# Patient Record
Sex: Male | Born: 2000 | Race: Black or African American | Hispanic: No | Marital: Single | Smoking: Never smoker
Health system: Southern US, Community
[De-identification: ages and names within clinical notes are randomized; demographics above are authoritative.]

## PROBLEM LIST (undated history)

## (undated) DIAGNOSIS — I10 Essential (primary) hypertension: Secondary | ICD-10-CM

## (undated) DIAGNOSIS — E119 Type 2 diabetes mellitus without complications: Secondary | ICD-10-CM

## (undated) HISTORY — DX: Type 2 diabetes mellitus without complications: E11.9

## (undated) HISTORY — PX: OTHER SURGICAL HISTORY: SHX169

---
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
## 2021-01-09 ENCOUNTER — Other Ambulatory Visit (INDEPENDENT_AMBULATORY_CARE_PROVIDER_SITE_OTHER): Payer: Self-pay | Admitting: Family

## 2021-01-09 DIAGNOSIS — IMO0002 Reserved for concepts with insufficient information to code with codable children: Secondary | ICD-10-CM

## 2021-01-09 DIAGNOSIS — E1065 Type 1 diabetes mellitus with hyperglycemia: Secondary | ICD-10-CM

## 2021-01-10 ENCOUNTER — Encounter (INDEPENDENT_AMBULATORY_CARE_PROVIDER_SITE_OTHER): Payer: Self-pay | Admitting: Family

## 2021-01-10 ENCOUNTER — Ambulatory Visit (INDEPENDENT_AMBULATORY_CARE_PROVIDER_SITE_OTHER): Payer: Medicaid Other | Admitting: Family

## 2021-01-10 ENCOUNTER — Other Ambulatory Visit: Payer: Self-pay

## 2021-01-10 VITALS — BP 122/76 | HR 76 | Wt 176.2 lb

## 2021-01-10 DIAGNOSIS — Z4681 Encounter for fitting and adjustment of insulin pump: Secondary | ICD-10-CM | POA: Diagnosis not present

## 2021-01-10 DIAGNOSIS — Z9119 Patient's noncompliance with other medical treatment and regimen: Secondary | ICD-10-CM | POA: Diagnosis not present

## 2021-01-10 DIAGNOSIS — IMO0002 Reserved for concepts with insufficient information to code with codable children: Secondary | ICD-10-CM

## 2021-01-10 DIAGNOSIS — Z91199 Patient's noncompliance with other medical treatment and regimen due to unspecified reason: Secondary | ICD-10-CM

## 2021-01-10 DIAGNOSIS — E1065 Type 1 diabetes mellitus with hyperglycemia: Secondary | ICD-10-CM

## 2021-01-10 LAB — POCT GLUCOSE (DEVICE FOR HOME USE): POC Glucose: 272 mg/dl — AB (ref 70–99)

## 2021-01-10 LAB — POCT GLYCOSYLATED HEMOGLOBIN (HGB A1C): Hemoglobin A1C: 12.8 % — AB (ref 4.0–5.6)

## 2021-01-10 NOTE — Progress Notes (Signed)
Pediatric Endocrinology Diabetes Consultation Follow-up Visit  Ricky Shaw August 24, 2000 [id number]  Chief Complaint: Follow-up type 1 diabetes   Ricky Benders, MD   HPI: Ricky Shaw  is a 20 y.o. male presenting for follow-up of type 1 diabetes. he is accompanied to this visit by his grandmother (stayed in lobby) .  1. Ricky Shaw was admitted to the pediatric ward at [facility] on 11/13/11 for the above chief complaint. He had had polyuria and polydipsia. His initial BG was 340. Initial serum glucose was 354. Serum CO2 was 18. Venous pH was 7.367.  Hemoglobin A1c was 13.3%. Serum C-peptide was 0.68 (normal 0.80-390). Urine glucose was >1000 and urine ketones were >80. Anti-islet cell antibody was 40 (normal <5). Insulin antibodies were 5.0 (normal <0.4).Anti-GAD antibody was negative at <1.0. TSH was 2.344, free T4 1.36, free T3 3.5.  Tissue transglutaminase IgA was 2.2 (normal <20). Gliadin IgA antibodies were 4.9 (normal <20). He was started on Lantus as a basal insulin and Novolog as a bolus insulin at mealtimes, and also at bedtime and 2 AM if needed. After receiving IV fluids, insulins, and DM education, he was discharged on 11/17/11.   2. Since last visit to [facility] on 11/2020, he has been well. He has no hospitalizations or ER visits.  He recently go laid off from his job and is now looking for another job.  Using Tandem Tslim insulin pump and Dexcom CGM. Reports pump is working well but is having problems getting Dexcom CGM refills. He went one week without using CGM. He is bolusing before eating at most meals. Forgets to bolus about once per day. He estimates his carbs. Feels like his blood sugars are "usually high".   Insulin regimen: Tsim insulin pump  Basal Rates 12AM  1.65  3am 1.75   6am 1.80   8am 2.0   9pm   2. 0       Insulin to Carbohydrate Ratio 12AM 10  6am 4  11am 4  4pm 4       Insulin Sensitivity Factor 12AM 24  3am 24  6am 24  7am 20  8am 9pm 11pm  _0 Target Blood Glucose 12AM 150  6am 110  9pm 150          Hypoglycemia: Is not consistently able to feel low blood sugars.practice. No glucagon needed.  Insulin pump download:  - Review of his pump shows he is usually bolusing 1 time per day or less. He has multiple days with 0 boluses.  Med-alert ID: Bracelet  Injection sites: legs, arms  Annual labs due: 12/2020 Ophthalmology due: 2021. Discussed he is overdue. Needs to have exam.     3. ROS: Greater than 10 systems reviewed with pertinent positives listed in HPI, otherwise neg. Constitutional: Sleeping well. Weight stable.  Eyes: No changes in vision. No blurry vision.  Ears/Nose/Mouth/Throat: No difficulty swallowing. Denies neck pain  Cardiovascular: No palpitations. Denies chest pain  Respiratory: No increased work of breathing. Denies SOB   Neurologic: Normal sensation, no tremor GI: No abdominal pain. No constipation/diarrhea.  Endocrine: No polydipsia.  No hyperpigmentation Psychiatric: Normal affect today. Denies depression and anxiety   Past Medical History:   Past Medical History:  Diagnosis Date   Diabetes mellitus 11/14/2011   Diabetes mellitus without complication ([place])    Phreesia 03/03/2020    Medications:  Outpatient Encounter Medications as of 01/10/2021  Medication Sig Note   Accu-Chek Softclix Lancets lancets Use as instructed  Blood Glucose Monitoring Suppl (ACCU-CHEK GUIDE ME) w/Device KIT 1 kit by Does not apply route as directed.    Continuous Blood Gluc Receiver (DEXCOM G6 RECEIVER) DEVI 1 Device by Does not apply route as directed.    Continuous Blood Gluc Sensor (DEXCOM G6 SENSOR) MISC INJECT 1 APPLICATOR INTO THE SKIN AS DIRECTED(CHANGE SENSOR EVERY 10 DAYS)    Continuous Blood Gluc Transmit (DEXCOM G6 TRANSMITTER) MISC Inject 1 Device into the skin as directed. (re-use up to 8x with each new sensor)    glucagon 1 MG injection Use for Severe Hypoglycemia, if unresponsive, unable  to swallow, unconscious and/or has seizure 04/20/2020: .   glucose blood (ACCU-CHEK GUIDE) test strip Use as instructed    insulin aspart (NOVOLOG FLEXPEN) 100 UNIT/ML FlexPen Back up for pump malfunction    Insulin Glargine (LANTUS SOLOSTAR) 100 UNIT/ML Solostar Pen Use up to 50 units daily as directed by MD 08/04/2019: For back up   Insulin Human (INSULIN PUMP) SOLN Inject 1 each into the skin See admin instructions. Use with Humalog.    insulin lispro (HUMALOG) 100 UNIT/ML injection Use 300 units in insulin pump every 48 hours    No facility-administered encounter medications on file as of 01/10/2021.    Allergies: Allergies  Allergen Reactions   Amoxicillin     Does not recall what reaction was Mother called MD to find allergy. Confirms amoxicillin    Surgical History: Past Surgical History:  Procedure Laterality Date   nasal cauterization     NASAL HEMORRHAGE CONTROL  05/14/2006    Family History:  Family History  Problem Relation Age of Onset   Asthma Maternal Aunt    Cancer Maternal Grandfather    Diabetes Paternal Grandmother    Hypertension Other    Thyroid disease Neg Hx       Social History: Lives with: mother  Graduated. Unemployed currently   Physical Exam:  Vitals:   01/10/21 1001  BP: 122/76  Pulse: 76  Weight: 176 lb 3.2 oz (79.9 kg)     BP 122/76 (BP Location: Right Arm, Patient Position: Sitting, Cuff Size: Normal)   Pulse 76   Wt 176 lb 3.2 oz (79.9 kg)   BMI 29.00 kg/m  Body mass index: body mass index is 29 kg/m. Blood pressure percentiles are not available for patients who are 18 years or older.  Ht Readings from Last 3 Encounters:  08/02/20 5' 5.35" (1.66 m) (8 %, Z= -1.43)*  04/20/20 _0  (1.651 m) (6 %, Z= -1.52)*  04/18/20 5' 5.35" (1.66 m) (8 %, Z= -1.40)*   * Growth percentiles are based on CDC (Boys, 2-20 Years) data.   Wt Readings from Last 3 Encounters:  01/10/21 176 lb 3.2 oz (79.9 kg) (81 %, Z= 0.88)*  11/29/20 180 lb  12.8 oz (82 kg) (85 %, Z= 1.03)*  09/01/20 188 lb 6.4 oz (85.5 kg) (90 %, Z= 1.27)*   * Growth percentiles are based on CDC (Boys, 2-20 Years) data.    PHYSICAL EXAM: General: Well developed, well nourished male in no acute distress.   Head: Normocephalic, atraumatic.   Eyes:  Pupils equal and round. EOMI.  Sclera white.  No eye drainage.   Ears/Nose/Mouth/Throat: Nares patent, no nasal drainage.  Normal dentition, mucous membranes moist.  Neck: supple, no cervical lymphadenopathy, no thyromegaly Cardiovascular: regular rate, normal S1/S2, no murmurs Respiratory: No increased work of breathing.  Lungs clear to auscultation bilaterally.  No wheezes. Abdomen: soft, nontender, nondistended. Normal bowel sounds.  No appreciable masses  Extremities: warm, well perfused, cap refill < 2 sec.   Musculoskeletal: Normal muscle mass.  Normal strength Skin: warm, dry.  No rash or lesions. Neurologic: alert and oriented, normal speech, no tremor  Labs:  Results for orders placed or performed in visit on 01/10/21  POCT glycosylated hemoglobin (Hb A1C)  Result Value Ref Range   Hemoglobin A1C 12.8 (A) 4.0 - 5.6 %   HbA1c POC (<> result, manual entry)     HbA1c, POC (prediabetic range)     HbA1c, POC (controlled diabetic range)    POCT Glucose (Device for Home Use)  Result Value Ref Range   Glucose Fasting, POC     POC Glucose 272 (A) 70 - 99 mg/dl    Assessment/Plan: Drew is a 20 y.o. male with uncontrolled type 1 diabetes using Tandem Tslim insulin pump. Struggling with compliance with diabetes care. Has frequent hyperglycemia due to not bolusing. Hemoglobin A1c is 12.8% today which is much higher then ADA goal of <7%.    1-4. DM w/o complication type I, uncontrolled (HCC)/Hyperglycemia/hypoglycemia unawareness/elevated a1c  - Reviewed insulin pump and CGM download. Discussed trends and patterns.  - Rotate pump sites to prevent scar tissue.  - bolus 15 minutes prior to eating to  limit blood sugar spikes.  - Reviewed carb counting and importance of accurate carb counting.  - Discussed signs and symptoms of hypoglycemia. Always have glucose available.  - POCT glucose and hemoglobin A1c  - Reviewed growth chart.  - Stressed importance of bolusing accurately at all meals and snacks. Enter carbs and blood sugars.   4. Adjustment reaction/noncompliance  - Discussed barriers to care and motivation.  - Discussed complications of uncontrolle  T1DM.   5. Insulin pump Titration /Insulin pump in place.  - Basal Rates 12AM  1.65--> 1.75  3am 1.75 --> 1.85  6am 1.80 --> 1.90   8am 2.0 --> 2.1   9pm   2. 0 --> 2.1    48.8 units per day    Follow-up:  4 weeks   LOS:>45 spent today reviewing the medical chart, counseling the patient/family, and documenting today's visit.  When a patient is on insulin, intensive monitoring of blood glucose levels is necessary to avoid hyperglycemia and hypoglycemia. Severe hyperglycemia/hypoglycemia can lead to hospital admissions and be life threatening.     Hermenia Bers,  FNP-C  Pediatric Specialist  [address] [place]  [place], [address]  Tele: [phone]

## 2021-01-10 NOTE — Patient Instructions (Signed)
It was a pleasure seeing you in clinic today. Please do not hesitate to contact me if you have questions or concerns.    Please sign up for MyChart. This is a communication tool that allows you to send an email directly to me. This can be used for questions, prescriptions and blood sugar reports. We will also release labs to you with instructions on MyChart. Please do not use MyChart if you need immediate or emergency assistance. Ask our wonderful front office staff if you need assistance.   - Basal Rates 12AM  1.65--> 1.75  3am 1.75 --> 1.85  6am 1.80 --> 1.90   8am 2.0 --> 2.1   9pm   2. 0 --> 2.1

## 2021-02-02 ENCOUNTER — Telehealth (INDEPENDENT_AMBULATORY_CARE_PROVIDER_SITE_OTHER): Payer: Self-pay | Admitting: Family

## 2021-02-02 DIAGNOSIS — E1065 Type 1 diabetes mellitus with hyperglycemia: Secondary | ICD-10-CM

## 2021-02-02 DIAGNOSIS — IMO0002 Reserved for concepts with insufficient information to code with codable children: Secondary | ICD-10-CM

## 2021-02-02 MED ORDER — INSULIN LISPRO 100 UNIT/ML ~~LOC~~ SOLN: SUBCUTANEOUS | 5 refills | Status: DC

## 2021-02-02 NOTE — Telephone Encounter (Signed)
  Who's calling (name and relationship to patient) :mom / [name]   Best contact number:[phone]  Provider they SPQ:ZRAQTMA Dalbert [name]   Reason for call:medication      PRESCRIPTION REFILL ONLY  Name of prescription:Humalog  Pharmacy:[facility]

## 2021-02-02 NOTE — Telephone Encounter (Signed)
Refill sent to pharmacy.   

## 2021-02-07 ENCOUNTER — Other Ambulatory Visit: Payer: Self-pay

## 2021-02-07 ENCOUNTER — Encounter (INDEPENDENT_AMBULATORY_CARE_PROVIDER_SITE_OTHER): Payer: Self-pay | Admitting: Family

## 2021-02-07 ENCOUNTER — Ambulatory Visit (INDEPENDENT_AMBULATORY_CARE_PROVIDER_SITE_OTHER): Payer: Medicaid Other | Admitting: Family

## 2021-02-07 VITALS — BP 130/80 | HR 86 | Ht 65.35 in | Wt 182.4 lb

## 2021-02-07 DIAGNOSIS — E1065 Type 1 diabetes mellitus with hyperglycemia: Secondary | ICD-10-CM

## 2021-02-07 DIAGNOSIS — Z9119 Patient's noncompliance with other medical treatment and regimen: Secondary | ICD-10-CM

## 2021-02-07 DIAGNOSIS — Z4681 Encounter for fitting and adjustment of insulin pump: Secondary | ICD-10-CM

## 2021-02-07 DIAGNOSIS — Z23 Encounter for immunization: Secondary | ICD-10-CM | POA: Diagnosis not present

## 2021-02-07 DIAGNOSIS — Z91199 Patient's noncompliance with other medical treatment and regimen due to unspecified reason: Secondary | ICD-10-CM

## 2021-02-07 DIAGNOSIS — IMO0002 Reserved for concepts with insufficient information to code with codable children: Secondary | ICD-10-CM

## 2021-02-07 LAB — POCT URINALYSIS DIPSTICK

## 2021-02-07 LAB — POCT GLUCOSE (DEVICE FOR HOME USE): POC Glucose: 354 mg/dl — AB (ref 70–99)

## 2021-02-07 NOTE — Patient Instructions (Addendum)
It was a pleasure seeing you in clinic today. Please do not hesitate to contact me if you have questions or concerns.   - Basal Rates 12AM 1.75--> 1.85  3am 1.85--> 1.95  6am 1.90--> 2.0   8am 2.1--> 2.20   9pm  2.1 --> 2.20    50.6 units per day   Insulin to Carbohydrate Ratio 12AM 10  6am 4  11am 4--> 3   4pm 4       Please sign up for MyChart. This is a communication tool that allows you to send an email directly to me. This can be used for questions, prescriptions and blood sugar reports. We will also release labs to you with instructions on MyChart. Please do not use MyChart if you need immediate or emergency assistance. Ask our wonderful front office staff if you need assistance.

## 2021-02-07 NOTE — Progress Notes (Signed)
Pediatric Endocrinology Diabetes Consultation Follow-up Visit  Ricky Shaw 13-Aug-2000 [id number]  Chief Complaint: Follow-up type 1 diabetes   [name], MD   HPI: Ricky Shaw  is a 20 y.o. male presenting for follow-up of type 1 diabetes. he is accompanied to this visit by his grandmother (stayed in lobby) .  1. Ricky Shaw was admitted to the pediatric ward at [facility] on 11/13/11 for the above chief complaint. He had had polyuria and polydipsia. His initial BG was 340. Initial serum glucose was 354. Serum CO2 was 18. Venous pH was 7.367.  Hemoglobin A1c was 13.3%. Serum C-peptide was 0.68 (normal 0.80-390). Urine glucose was >1000 and urine ketones were >80. Anti-islet cell antibody was 40 (normal <5). Insulin antibodies were 5.0 (normal <0.4).Anti-GAD antibody was negative at <1.0. TSH was 2.344, free T4 1.36, free T3 3.5.  Tissue transglutaminase IgA was 2.2 (normal <20). Gliadin IgA antibodies were 4.9 (normal <20). He was started on Lantus as a basal insulin and Novolog as a bolus insulin at mealtimes, and also at bedtime and 2 AM if needed. After receiving IV fluids, insulins, and DM education, he was discharged on 11/17/11.   2. Since last visit to [facility] on 12/2020, he has been well. He has no hospitalizations or ER visits.  He has been spending most of his free time helping his Grandmother and filling out job applications. He has not gotten much activity lately.   Reports that things are getting better with his diabetes care. He feels like he is bolusing more consistently and when he does forgets he gives correction bolus later. His Tslim is working well and he is consistently wearing Dexcom. Rarely having hypoglycemia.     Insulin regimen: Tsim insulin pump  - Basal Rates 12AM 1.75  3am 1.85  6am 1.90  8am 2.1   9pm  2.1    48.8 units per day   Insulin to Carbohydrate Ratio 12AM 10  6am 4  11am 4  4pm 4       Insulin Sensitivity Factor 12AM 24  3am 24  6am 24   7am 20  8am 9pm 11pm '20 20 24   ' Target Blood Glucose 12AM 150  6am 110  9pm 150          Hypoglycemia: Is not consistently able to feel low blood sugars.practice. No glucagon needed.  Insulin pump download:   Med-alert ID: Bracelet  Injection sites: legs, arms  Annual labs due: 12/2020 Ophthalmology due: 2021. Discussed he is overdue. Needs to have exam.     3. ROS: Greater than 10 systems reviewed with pertinent positives listed in HPI, otherwise neg. Constitutional: Sleeping well. Weight stable.  Eyes: No changes in vision. No blurry vision.  Ears/Nose/Mouth/Throat: No difficulty swallowing. Denies neck pain  Cardiovascular: No palpitations. Denies chest pain  Respiratory: No increased work of breathing. Denies SOB   Neurologic: Normal sensation, no tremor GI: No abdominal pain. No constipation/diarrhea.  Endocrine: No polydipsia.  No hyperpigmentation Psychiatric: Normal affect today. Denies depression and anxiety   Past Medical History:   Past Medical History:  Diagnosis Date   Diabetes mellitus 11/14/2011   Diabetes mellitus without complication ([place])    Phreesia 03/03/2020    Medications:  Outpatient Encounter Medications as of 02/07/2021  Medication Sig Note   Accu-Chek Softclix Lancets lancets Use as instructed    Blood Glucose Monitoring Suppl (ACCU-CHEK GUIDE ME) w/Device KIT 1 kit by Does not apply route as directed.    Continuous Blood Gluc Receiver (  DEXCOM G6 RECEIVER) DEVI 1 Device by Does not apply route as directed.    Continuous Blood Gluc Sensor (DEXCOM G6 SENSOR) MISC INJECT 1 APPLICATOR INTO THE SKIN AS DIRECTED(CHANGE SENSOR EVERY 10 DAYS)    Continuous Blood Gluc Transmit (DEXCOM G6 TRANSMITTER) MISC Inject 1 Device into the skin as directed. (re-use up to 8x with each new sensor)    glucagon 1 MG injection Use for Severe Hypoglycemia, if unresponsive, unable to swallow, unconscious and/or has seizure 04/20/2020: .   glucose blood (ACCU-CHEK  GUIDE) test strip Use as instructed    insulin aspart (NOVOLOG FLEXPEN) 100 UNIT/ML FlexPen Back up for pump malfunction    Insulin Glargine (LANTUS SOLOSTAR) 100 UNIT/ML Solostar Pen Use up to 50 units daily as directed by MD 08/04/2019: For back up   Insulin Human (INSULIN PUMP) SOLN Inject 1 each into the skin See admin instructions. Use with Humalog.    insulin lispro (HUMALOG) 100 UNIT/ML injection Use 300 units in insulin pump every 48 hours    No facility-administered encounter medications on file as of 02/07/2021.    Allergies: Allergies  Allergen Reactions   Amoxicillin     Does not recall what reaction was Mother called MD to find allergy. Confirms amoxicillin    Surgical History: Past Surgical History:  Procedure Laterality Date   nasal cauterization     NASAL HEMORRHAGE CONTROL  05/14/2006    Family History:  Family History  Problem Relation Age of Onset   Asthma Maternal Aunt    Cancer Maternal Grandfather    Diabetes Paternal Grandmother    Hypertension Other    Thyroid disease Neg Hx       Social History: Lives with: mother  Graduated. Unemployed currently   Physical Exam:  There were no vitals filed for this visit.    There were no vitals taken for this visit. Body mass index: body mass index is unknown because there is no height or weight on file. Blood pressure percentiles are not available for patients who are 18 years or older.  Ht Readings from Last 3 Encounters:  08/02/20 5' 5.35" (1.66 m) (8 %, Z= -1.43)*  04/20/20 '5\' 5"'  (1.651 m) (6 %, Z= -1.52)*  04/18/20 5' 5.35" (1.66 m) (8 %, Z= -1.40)*   * Growth percentiles are based on CDC (Boys, 2-20 Years) data.   Wt Readings from Last 3 Encounters:  01/10/21 176 lb 3.2 oz (79.9 kg) (81 %, Z= 0.88)*  11/29/20 180 lb 12.8 oz (82 kg) (85 %, Z= 1.03)*  09/01/20 188 lb 6.4 oz (85.5 kg) (90 %, Z= 1.27)*   * Growth percentiles are based on CDC (Boys, 2-20 Years) data.    PHYSICAL EXAM: General:  Well developed, well nourished male in no acute distress.   Head: Normocephalic, atraumatic.   Eyes:  Pupils equal and round. EOMI.  Sclera white.  No eye drainage.   Ears/Nose/Mouth/Throat: Nares patent, no nasal drainage.  Normal dentition, mucous membranes moist.  Neck: supple, no cervical lymphadenopathy, no thyromegaly Cardiovascular: regular rate, normal S1/S2, no murmurs Respiratory: No increased work of breathing.  Lungs clear to auscultation bilaterally.  No wheezes. Abdomen: soft, nontender, nondistended. Normal bowel sounds.  No appreciable masses  Extremities: warm, well perfused, cap refill < 2 sec.   Musculoskeletal: Normal muscle mass.  Normal strength Skin: warm, dry.  No rash or lesions. Neurologic: alert and oriented, normal speech, no tremor   Labs:  Results for orders placed or performed in visit on  01/10/21  POCT glycosylated hemoglobin (Hb A1C)  Result Value Ref Range   Hemoglobin A1C 12.8 (A) 4.0 - 5.6 %   HbA1c POC (<> result, manual entry)     HbA1c, POC (prediabetic range)     HbA1c, POC (controlled diabetic range)    POCT Glucose (Device for Home Use)  Result Value Ref Range   Glucose Fasting, POC     POC Glucose 272 (A) 70 - 99 mg/dl    Assessment/Plan: Tiago is a 20 y.o. male with uncontrolled type 1 diabetes using Tandem Tslim insulin pump. He has done better with diabetes control over the past month. He is bolusing more consistently which has decreased time in hyperglycemia. TIR has improved to 43% but is less then goal of >70%. He needs more basal insulin throughout the day.     1-4. DM w/o complication type I, uncontrolled (HCC)/Hyperglycemia/hypoglycemia unawareness/elevated a1c  - Reviewed insulin pump and CGM download. Discussed trends and patterns.  - Rotate pump sites to prevent scar tissue.  - bolus 15 minutes prior to eating to limit blood sugar spikes.  - Reviewed carb counting and importance of accurate carb counting.  - Discussed  signs and symptoms of hypoglycemia. Always have glucose available.  - POCT glucose and hemoglobin A1c  - Reviewed growth chart.   4. Adjustment reaction/noncompliance  - Discussed barriers to care and motivation.  - Praise given for improvements.   5. Insulin pump Titration /Insulin pump in place.  - Basal Rates 12AM 1.75--> 1.85  3am 1.85--> 1.95  6am 1.90--> 2.0   8am 2.1--> 2.20   9pm  2.1 --> 2.20    50.6 units per day   Insulin to Carbohydrate Ratio 12AM 10  6am 4  11am 4--> 3   4pm 4      Follow-up:  6 weeks.   LOS:>30  spent today reviewing the medical chart, counseling the patient/family, and documenting today's visit.   When a patient is on insulin, intensive monitoring of blood glucose levels is necessary to avoid hyperglycemia and hypoglycemia. Severe hyperglycemia/hypoglycemia can lead to hospital admissions and be life threatening.     Hermenia Bers,  FNP-C  Pediatric Specialist  [address] [place]  [place], [address]  Tele: [phone]

## 2021-03-07 ENCOUNTER — Ambulatory Visit: Payer: Self-pay | Admitting: Pediatrics

## 2021-03-09 ENCOUNTER — Ambulatory Visit (INDEPENDENT_AMBULATORY_CARE_PROVIDER_SITE_OTHER): Payer: Medicaid Other | Admitting: Family

## 2021-03-09 NOTE — Progress Notes (Deleted)
Pediatric Endocrinology Diabetes Consultation Follow-up Visit  Ricky Shaw February 10, 2001 [id number]  Chief Complaint: Follow-up type 1 diabetes   [name], MD   HPI: Ricky Shaw  is a 20 y.o. male presenting for follow-up of type 1 diabetes. he is accompanied to this visit by his grandmother (stayed in lobby) .  1. Ricky Shaw was admitted to the pediatric ward at [facility] on 11/13/11 for the above chief complaint. He had had polyuria and polydipsia. His initial BG was 340. Initial serum glucose was 354. Serum CO2 was 18. Venous pH was 7.367.  Hemoglobin A1c was 13.3%. Serum C-peptide was 0.68 (normal 0.80-390). Urine glucose was >1000 and urine ketones were >80. Anti-islet cell antibody was 40 (normal <5). Insulin antibodies were 5.0 (normal <0.4).Anti-GAD antibody was negative at <1.0. TSH was 2.344, free T4 1.36, free T3 3.5.  Tissue transglutaminase IgA was 2.2 (normal <20). Gliadin IgA antibodies were 4.9 (normal <20). He was started on Lantus as a basal insulin and Novolog as a bolus insulin at mealtimes, and also at bedtime and 2 AM if needed. After receiving IV fluids, insulins, and DM education, he was discharged on 11/17/11.   2. Since last visit to [facility] on 01/2021, he has been well. He has no hospitalizations or ER visits.  He has been spending most of his free time helping his Grandmother and filling out job applications. He has not gotten much activity lately.   Reports that things are getting better with his diabetes care. He feels like he is bolusing more consistently and when he does forgets he gives correction bolus later. His Tslim is working well and he is consistently wearing Dexcom. Rarely having hypoglycemia.     Insulin regimen: Tsim insulin pump  - Basal Rates 12AM 1.85  3am 1.95  6am 2.0   8am 2.20   9pm  2.20    50.6 units per day   Insulin to Carbohydrate Ratio 12AM 10  6am 4  11am 3   4pm 4        Insulin Sensitivity Factor 12AM 24  3am 24   6am 24  7am 20  8am 9pm 11pm '20 20 24   ' Target Blood Glucose 12AM 150  6am 110  9pm 150          Hypoglycemia: Is not consistently able to feel low blood sugars.practice. No glucagon needed.  Insulin pump download:   Med-alert ID: Bracelet  Injection sites: legs, arms  Annual labs due: 12/2020 Ophthalmology due: 2021. Discussed he is overdue. Needs to have exam.     3. ROS: Greater than 10 systems reviewed with pertinent positives listed in HPI, otherwise neg. Constitutional: Sleeping well. Weight stable.  Eyes: No changes in vision. No blurry vision.  Ears/Nose/Mouth/Throat: No difficulty swallowing. Denies neck pain  Cardiovascular: No palpitations. Denies chest pain  Respiratory: No increased work of breathing. Denies SOB   Neurologic: Normal sensation, no tremor GI: No abdominal pain. No constipation/diarrhea.  Endocrine: No polydipsia.  No hyperpigmentation Psychiatric: Normal affect today. Denies depression and anxiety   Past Medical History:   Past Medical History:  Diagnosis Date   Diabetes mellitus 11/14/2011   Diabetes mellitus without complication ([place])    Phreesia 03/03/2020    Medications:  Outpatient Encounter Medications as of 03/09/2021  Medication Sig Note   Accu-Chek Softclix Lancets lancets Use as instructed    Blood Glucose Monitoring Suppl (ACCU-CHEK GUIDE ME) w/Device KIT 1 kit by Does not apply route as directed.    Continuous  Blood Gluc Receiver (DEXCOM G6 RECEIVER) DEVI 1 Device by Does not apply route as directed.    Continuous Blood Gluc Sensor (DEXCOM G6 SENSOR) MISC INJECT 1 APPLICATOR INTO THE SKIN AS DIRECTED(CHANGE SENSOR EVERY 10 DAYS)    Continuous Blood Gluc Transmit (DEXCOM G6 TRANSMITTER) MISC Inject 1 Device into the skin as directed. (re-use up to 8x with each new sensor)    glucagon 1 MG injection Use for Severe Hypoglycemia, if unresponsive, unable to swallow, unconscious and/or has seizure 04/20/2020: .   glucose blood  (ACCU-CHEK GUIDE) test strip Use as instructed    insulin aspart (NOVOLOG FLEXPEN) 100 UNIT/ML FlexPen Back up for pump malfunction    Insulin Glargine (LANTUS SOLOSTAR) 100 UNIT/ML Solostar Pen Use up to 50 units daily as directed by MD 08/04/2019: For back up   Insulin Human (INSULIN PUMP) SOLN Inject 1 each into the skin See admin instructions. Use with Humalog. (Patient not taking: Reported on 02/07/2021)    insulin lispro (HUMALOG) 100 UNIT/ML injection Use 300 units in insulin pump every 48 hours    No facility-administered encounter medications on file as of 03/09/2021.    Allergies: Allergies  Allergen Reactions   Amoxicillin     Does not recall what reaction was Mother called MD to find allergy. Confirms amoxicillin    Surgical History: Past Surgical History:  Procedure Laterality Date   nasal cauterization     NASAL HEMORRHAGE CONTROL  05/14/2006    Family History:  Family History  Problem Relation Age of Onset   Asthma Maternal Aunt    Cancer Maternal Grandfather    Diabetes Paternal Grandmother    Hypertension Other    Thyroid disease Neg Hx       Social History: Lives with: mother  Graduated. Unemployed currently   Physical Exam:  There were no vitals filed for this visit.    There were no vitals taken for this visit. Body mass index: body mass index is unknown because there is no height or weight on file. Blood pressure percentiles are not available for patients who are 18 years or older.  Ht Readings from Last 3 Encounters:  02/07/21 5' 5.35" (1.66 m) (7 %, Z= -1.46)*  08/02/20 5' 5.35" (1.66 m) (8 %, Z= -1.43)*  04/20/20 '5\' 5"'  (1.651 m) (6 %, Z= -1.52)*   * Growth percentiles are based on CDC (Boys, 2-20 Years) data.   Wt Readings from Last 3 Encounters:  02/07/21 182 lb 6.4 oz (82.7 kg) (85 %, Z= 1.05)*  01/10/21 176 lb 3.2 oz (79.9 kg) (81 %, Z= 0.88)*  11/29/20 180 lb 12.8 oz (82 kg) (85 %, Z= 1.03)*   * Growth percentiles are based on CDC  (Boys, 2-20 Years) data.    PHYSICAL EXAM: General: Well developed, well nourished male in no acute distress.   Head: Normocephalic, atraumatic.   Eyes:  Pupils equal and round. EOMI.  Sclera white.  No eye drainage.   Ears/Nose/Mouth/Throat: Nares patent, no nasal drainage.  Normal dentition, mucous membranes moist.  Neck: supple, no cervical lymphadenopathy, no thyromegaly Cardiovascular: regular rate, normal S1/S2, no murmurs Respiratory: No increased work of breathing.  Lungs clear to auscultation bilaterally.  No wheezes. Abdomen: soft, nontender, nondistended. Normal bowel sounds.  No appreciable masses  Extremities: warm, well perfused, cap refill < 2 sec.   Musculoskeletal: Normal muscle mass.  Normal strength Skin: warm, dry.  No rash or lesions. Neurologic: alert and oriented, normal speech, no tremor    Labs:  Results for orders placed or performed in visit on 02/07/21  POCT Glucose (Device for Home Use)  Result Value Ref Range   Glucose Fasting, POC     POC Glucose 354 (A) 70 - 99 mg/dl  POCT urinalysis dipstick  Result Value Ref Range   Color, UA     Clarity, UA     Glucose, UA     Bilirubin, UA     Ketones, UA trace    Spec Grav, UA     Blood, UA     pH, UA     Protein, UA     Urobilinogen, UA     Nitrite, UA     Leukocytes, UA     Appearance     Odor      Assessment/Plan: Ricky Shaw is a 20 y.o. male with uncontrolled type 1 diabetes using Tandem Tslim insulin pump. He has done better with diabetes control over the past month. He is bolusing more consistently which has decreased time in hyperglycemia. TIR has improved to 43% but is less then goal of >70%. He needs more basal insulin throughout the day.     1-4. DM w/o complication type I, uncontrolled (HCC)/Hyperglycemia/hypoglycemia unawareness/elevated a1c  - Reviewed insulin pump and CGM download. Discussed trends and patterns.  - Rotate pump sites to prevent scar tissue.  - bolus 15 minutes prior  to eating to limit blood sugar spikes.  - Reviewed carb counting and importance of accurate carb counting.  - Discussed signs and symptoms of hypoglycemia. Always have glucose available.  - POCT glucose and hemoglobin A1c  - Reviewed growth chart.    4. Adjustment reaction/noncompliance  - Discussed barriers to care and motivation.  - Praise given for improvements.   5. Insulin pump Titration /Insulin pump in place.  - Basal Rates 12AM 1.75--> 1.85  3am 1.85--> 1.95  6am 1.90--> 2.0   8am 2.1--> 2.20   9pm  2.1 --> 2.20    50.6 units per day   Insulin to Carbohydrate Ratio 12AM 10  6am 4  11am 4--> 3   4pm 4      Follow-up:  6 weeks.   LOS:>30  spent today reviewing the medical chart, counseling the patient/family, and documenting today's visit.   When a patient is on insulin, intensive monitoring of blood glucose levels is necessary to avoid hyperglycemia and hypoglycemia. Severe hyperglycemia/hypoglycemia can lead to hospital admissions and be life threatening.     Hermenia Bers,  FNP-C  Pediatric Specialist  [address] [place]  [place], [address]  Tele: [phone]

## 2021-03-10 ENCOUNTER — Other Ambulatory Visit (INDEPENDENT_AMBULATORY_CARE_PROVIDER_SITE_OTHER): Payer: Self-pay | Admitting: Family

## 2021-03-21 ENCOUNTER — Inpatient Hospital Stay (HOSPITAL_COMMUNITY): Payer: Medicaid Other

## 2021-03-21 ENCOUNTER — Inpatient Hospital Stay (HOSPITAL_COMMUNITY)
Admission: EM | Admit: 2021-03-21 | Discharge: 2021-03-23 | DRG: 919 | Disposition: A | Discharge status: Home / Self Care | Payer: Medicaid Other | Attending: Family Medicine | Admitting: Family Medicine

## 2021-03-21 DIAGNOSIS — T85614A Breakdown (mechanical) of insulin pump, initial encounter: Principal | ICD-10-CM | POA: Diagnosis present

## 2021-03-21 DIAGNOSIS — E1110 Type 2 diabetes mellitus with ketoacidosis without coma: Secondary | ICD-10-CM | POA: Diagnosis present

## 2021-03-21 DIAGNOSIS — R52 Pain, unspecified: Secondary | ICD-10-CM

## 2021-03-21 DIAGNOSIS — Z880 Allergy status to penicillin: Secondary | ICD-10-CM | POA: Diagnosis not present

## 2021-03-21 DIAGNOSIS — E1010 Type 1 diabetes mellitus with ketoacidosis without coma: Secondary | ICD-10-CM

## 2021-03-21 DIAGNOSIS — Y742 Prosthetic and other implants, materials and accessory general hospital and personal-use devices associated with adverse incidents: Secondary | ICD-10-CM | POA: Diagnosis present

## 2021-03-21 DIAGNOSIS — D72829 Elevated white blood cell count, unspecified: Secondary | ICD-10-CM | POA: Diagnosis present

## 2021-03-21 DIAGNOSIS — Z9641 Presence of insulin pump (external) (internal): Secondary | ICD-10-CM | POA: Diagnosis present

## 2021-03-21 DIAGNOSIS — Z794 Long term (current) use of insulin: Secondary | ICD-10-CM | POA: Diagnosis not present

## 2021-03-21 DIAGNOSIS — E875 Hyperkalemia: Secondary | ICD-10-CM | POA: Diagnosis present

## 2021-03-21 DIAGNOSIS — Z833 Family history of diabetes mellitus: Secondary | ICD-10-CM | POA: Diagnosis not present

## 2021-03-21 DIAGNOSIS — Z20822 Contact with and (suspected) exposure to covid-19: Secondary | ICD-10-CM | POA: Diagnosis present

## 2021-03-21 DIAGNOSIS — E860 Dehydration: Secondary | ICD-10-CM | POA: Diagnosis present

## 2021-03-21 DIAGNOSIS — R079 Chest pain, unspecified: Secondary | ICD-10-CM

## 2021-03-21 LAB — BASIC METABOLIC PANEL
Anion gap: 22 — ABNORMAL HIGH (ref 5–15)
BUN: 22 mg/dL — ABNORMAL HIGH (ref 6–20)
BUN: 19 mg/dL (ref 6–20)
CO2: <7 mmol/L — ABNORMAL LOW (ref 22–32)
CO2: 9 mmol/L — ABNORMAL LOW (ref 22–32)
Calcium: 10.1 mg/dL (ref 8.9–10.3)
Calcium: 9.5 mg/dL (ref 8.9–10.3)
Chloride: 97 mmol/L — ABNORMAL LOW (ref 98–111)
Chloride: 106 mmol/L (ref 98–111)
Creatinine, Ser: 1.59 mg/dL — ABNORMAL HIGH (ref 0.61–1.24)
Creatinine, Ser: 1.55 mg/dL — ABNORMAL HIGH (ref 0.61–1.24)
GFR, Estimated: >60 mL/min (ref >60)
GFR, Estimated: >60 mL/min (ref >60)
Glucose, Bld: 554 mg/dL (ref 70–99)
Glucose, Bld: 217 mg/dL — ABNORMAL HIGH (ref 70–99)
Potassium: 5.6 mmol/L — ABNORMAL HIGH (ref 3.5–5.1)
Potassium: 5.5 mmol/L — ABNORMAL HIGH (ref 3.5–5.1)
Sodium: 132 mmol/L — ABNORMAL LOW (ref 135–145)
Sodium: 137 mmol/L (ref 135–145)

## 2021-03-21 LAB — BETA-HYDROXYBUTYRIC ACID: Beta-Hydroxybutyric Acid: >8 mmol/L — ABNORMAL HIGH (ref 0.05–0.27)

## 2021-03-21 LAB — URINALYSIS, ROUTINE W REFLEX MICROSCOPIC
Bacteria, UA: NONE SEEN
Bilirubin Urine: NEGATIVE
Glucose, UA: >=500 mg/dL — AB
Ketones, ur: 80 mg/dL — AB
Leukocytes,Ua: NEGATIVE
Nitrite: NEGATIVE
Protein, ur: 30 mg/dL — AB
Specific Gravity, Urine: 1.021 (ref 1.005–1.030)
pH: 5 (ref 5.0–8.0)

## 2021-03-21 LAB — CBC
HCT: 49.7 % (ref 39.0–52.0)
Hemoglobin: 15.9 g/dL (ref 13.0–17.0)
MCH: 29.4 pg (ref 26.0–34.0)
MCHC: 32 g/dL (ref 30.0–36.0)
MCV: 91.9 fL (ref 80.0–100.0)
Platelets: 363 10*3/uL (ref 150–400)
RBC: 5.41 MIL/uL (ref 4.22–5.81)
RDW: 13.9 % (ref 11.5–15.5)
WBC: 14.8 10*3/uL — ABNORMAL HIGH (ref 4.0–10.5)
nRBC: 0 % (ref 0.0–0.2)

## 2021-03-21 LAB — CBG MONITORING, ED
Glucose-Capillary: 542 mg/dL (ref 70–99)
Glucose-Capillary: 541 mg/dL (ref 70–99)
Glucose-Capillary: 528 mg/dL (ref 70–99)
Glucose-Capillary: 420 mg/dL — ABNORMAL HIGH (ref 70–99)
Glucose-Capillary: 441 mg/dL — ABNORMAL HIGH (ref 70–99)
Glucose-Capillary: 319 mg/dL — ABNORMAL HIGH (ref 70–99)
Glucose-Capillary: 236 mg/dL — ABNORMAL HIGH (ref 70–99)
Glucose-Capillary: 210 mg/dL — ABNORMAL HIGH (ref 70–99)
Glucose-Capillary: 212 mg/dL — ABNORMAL HIGH (ref 70–99)

## 2021-03-21 LAB — I-STAT VENOUS BLOOD GAS, ED
Acid-base deficit: 17 mmol/L — ABNORMAL HIGH (ref 0.0–2.0)
Bicarbonate: 8.6 mmol/L — ABNORMAL LOW (ref 20.0–28.0)
Calcium, Ion: 1.14 mmol/L — ABNORMAL LOW (ref 1.15–1.40)
HCT: 52 % (ref 39.0–52.0)
Hemoglobin: 17.7 g/dL — ABNORMAL HIGH (ref 13.0–17.0)
O2 Saturation: 99 %
Potassium: 5.3 mmol/L — ABNORMAL HIGH (ref 3.5–5.1)
Sodium: 132 mmol/L — ABNORMAL LOW (ref 135–145)
TCO2: 9 mmol/L — ABNORMAL LOW (ref 22–32)
pCO2, Ven: 21.4 mmHg — ABNORMAL LOW (ref 44.0–60.0)
pH, Ven: 7.212 — ABNORMAL LOW (ref 7.250–7.430)
pO2, Ven: 141 mmHg — ABNORMAL HIGH (ref 32.0–45.0)

## 2021-03-21 LAB — LIPASE, BLOOD: Lipase: 18 U/L (ref 11–51)

## 2021-03-21 LAB — GLUCOSE, CAPILLARY
Glucose-Capillary: 186 mg/dL — ABNORMAL HIGH (ref 70–99)
Glucose-Capillary: 191 mg/dL — ABNORMAL HIGH (ref 70–99)
Glucose-Capillary: 193 mg/dL — ABNORMAL HIGH (ref 70–99)
Glucose-Capillary: 204 mg/dL — ABNORMAL HIGH (ref 70–99)

## 2021-03-21 LAB — RESP PANEL BY RT-PCR (FLU A&B, COVID) ARPGX2
Influenza A by PCR: NEGATIVE
Influenza B by PCR: NEGATIVE
SARS Coronavirus 2 by RT PCR: NEGATIVE

## 2021-03-21 MED ORDER — INSULIN REGULAR(HUMAN) IN NACL 100-0.9 UT/100ML-% IV SOLN
INTRAVENOUS | Status: DC
  Administered 2021-03-21: 10.5 [IU]/h via INTRAVENOUS
  Administered 2021-03-22: 2.2 [IU]/h via INTRAVENOUS
  Filled 2021-03-21 (×2): qty 100

## 2021-03-21 MED ORDER — LACTATED RINGERS IV BOLUS
1000.0000 mL | INTRAVENOUS | Status: AC
  Administered 2021-03-21: 1000 mL via INTRAVENOUS

## 2021-03-21 MED ORDER — ONDANSETRON HCL 4 MG/2ML IJ SOLN: 4.0000 mg | Freq: Four times a day (QID) | INTRAMUSCULAR | Status: DC | PRN

## 2021-03-21 MED ORDER — KETOROLAC TROMETHAMINE 30 MG/ML IJ SOLN
30.0000 mg | Freq: Once | INTRAMUSCULAR | Status: AC
  Administered 2021-03-21: 30 mg via INTRAVENOUS
  Filled 2021-03-21: qty 1

## 2021-03-21 MED ORDER — DEXTROSE IN LACTATED RINGERS 5 % IV SOLN: INTRAVENOUS | Status: DC

## 2021-03-21 MED ORDER — LACTATED RINGERS IV SOLN: INTRAVENOUS | Status: DC

## 2021-03-21 MED ORDER — DEXTROSE 50 % IV SOLN: 0.0000 mL | INTRAVENOUS | Status: DC | PRN

## 2021-03-21 MED ORDER — ENOXAPARIN SODIUM 40 MG/0.4ML IJ SOSY
40.0000 mg | PREFILLED_SYRINGE | INTRAMUSCULAR | Status: DC
  Administered 2021-03-21 – 2021-03-22 (×2): 40 mg via SUBCUTANEOUS
  Filled 2021-03-21 (×2): qty 0.4

## 2021-03-21 MED ORDER — ONDANSETRON HCL 4 MG/2ML IJ SOLN
4.0000 mg | Freq: Once | INTRAMUSCULAR | Status: AC
  Administered 2021-03-21: 4 mg via INTRAVENOUS
  Filled 2021-03-21: qty 2

## 2021-03-21 NOTE — ED Provider Notes (Signed)
[facility] EMERGENCY DEPARTMENT Provider Note   CSN: [id number] Arrival date & time: 03/21/21  1113     History Chief Complaint  Patient presents with   Emesis   Nausea    Ricky Shaw is a 19 y.o. male who presents to ED via EMS today for complaint of nausea and vomiting. Patient states he feels like "his sugar is high" and decided to call EMS because he believes he is in DKA. Patient denies any recent changes to diet. Patient states he wears an insulin pump and denies any recent issues with it or changes to insulin dosing. Patient was last seen by pediatric endocrinologist Spenser Beasley and and had no acute findings noted. Patient endorses increased thirst, urination and cold intolerance, nausea, vomiting and abdominal pain. Patient denies recent illness, fever, chills, diarrhea, chest pain, shortness of breath.    Emesis Associated symptoms: abdominal pain   Associated symptoms: no chills, no diarrhea, no fever and no sore throat       Past Medical History:  Diagnosis Date   Diabetes mellitus 11/14/2011   Diabetes mellitus without complication (HCC)    Phreesia 03/03/2020    Patient Active Problem List   Diagnosis Date Noted   DKA (diabetic ketoacidosis) (HCC) 04/20/2020   AKI (acute kidney injury) (HCC) 04/20/2020   Elevated hemoglobin A1c 01/21/2018   Insulin pump in place 07/08/2017   Hyperglycemia    Insulin pump titration 11/01/2015   Noncompliance with diabetes treatment    Hypoglycemia unawareness in type 1 diabetes mellitus (HCC) 04/20/2014   Polyuria 11/14/2011    Past Surgical History:  Procedure Laterality Date   nasal cauterization     NASAL HEMORRHAGE CONTROL  05/14/2006       Family History  Problem Relation Age of Onset   Asthma Maternal Aunt    Cancer Maternal Grandfather    Diabetes Paternal Grandmother    Hypertension Other    Thyroid disease Neg Hx     Social History   Tobacco Use   Smoking status: Never    Smokeless tobacco: Never  Vaping Use   Vaping Use: Never used  Substance Use Topics   Alcohol use: No   Drug use: Yes    Types: Marijuana    Home Medications Prior to Admission medications   Medication Sig Start Date End Date Taking? Authorizing Provider  Accu-Chek Softclix Lancets lancets Use as instructed 04/21/20   Jessup, Ashley Bashioum, MD  Blood Glucose Monitoring Suppl (ACCU-CHEK GUIDE ME) w/Device KIT 1 kit by Does not apply route as directed. 04/21/20   Jessup, Ashley Bashioum, MD  Continuous Blood Gluc Receiver (DEXCOM G6 RECEIVER) DEVI 1 Device by Does not apply route as directed. 12/21/19   Beasley, Spenser, NP  Continuous Blood Gluc Sensor (DEXCOM G6 SENSOR) MISC INJECT 1 APPLICATOR INTO THE SKIN AS DIRECTED(CHANGE SENSOR EVERY 10 DAYS) 01/09/21   Meehan, Colette, MD  Continuous Blood Gluc Transmit (DEXCOM G6 TRANSMITTER) MISC INJECT 1 DEVICE INTO THE SKIN AS DIRECTED( RE-USE UP TP 8X WITH EACH NEW SENSOR) 03/10/21   Beasley, Spenser, NP  glucagon 1 MG injection Use for Severe Hypoglycemia, if unresponsive, unable to swallow, unconscious and/or has seizure 09/05/16   Beasley, Spenser, NP  glucose blood (ACCU-CHEK GUIDE) test strip Use as instructed 04/21/20   Jessup, Ashley Bashioum, MD  insulin aspart (NOVOLOG FLEXPEN) 100 UNIT/ML FlexPen Back up for pump malfunction 08/25/18   Beasley, Spenser, NP  Insulin Glargine (LANTUS SOLOSTAR) 100 UNIT/ML Solostar Pen Use up   to 50 units daily as directed by MD 05/29/16   Hermenia Bers, NP  Insulin Human (INSULIN PUMP) SOLN Inject 1 each into the skin See admin instructions. Use with Humalog. Patient not taking: Reported on 02/07/2021    [provider]  insulin lispro (HUMALOG) 100 UNIT/ML injection Use 300 units in insulin pump every 48 hours 02/02/21   Hermenia Bers, NP    Allergies    Amoxicillin  Review of Systems   Review of Systems  Constitutional:  Negative for appetite change, chills and fever.  HENT:  Negative for  sore throat.   Respiratory:  Negative for shortness of breath.   Cardiovascular:  Negative for chest pain.  Gastrointestinal:  Positive for abdominal pain, nausea and vomiting. Negative for diarrhea.  Endocrine: Positive for cold intolerance, polydipsia and polyuria. Negative for heat intolerance and polyphagia.  All other systems reviewed and are negative.  Physical Exam Updated Vital Signs BP 127/76 (BP Location: Right Arm)   Pulse (!) 111   Temp 98.6 F (37 C) (Oral)   Resp 19   SpO2 98%   Physical Exam Vitals and nursing note reviewed.  Constitutional:      Appearance: He is ill-appearing. He is not diaphoretic.  HENT:     Head: Normocephalic.     Nose: Nose normal.     Mouth/Throat:     Mouth: Mucous membranes are moist.  Eyes:     Pupils: Pupils are equal, round, and reactive to light.  Cardiovascular:     Rate and Rhythm: Regular rhythm. Tachycardia present.     Pulses: Normal pulses.     Heart sounds: No murmur heard. Pulmonary:     Effort: Pulmonary effort is normal.     Breath sounds: Normal breath sounds. No wheezing.  Abdominal:     General: Abdomen is flat.     Palpations: Abdomen is soft.     Tenderness: There is abdominal tenderness.  Skin:    General: Skin is warm and dry.     Capillary Refill: Capillary refill takes less than 2 seconds.  Neurological:     General: No focal deficit present.  Psychiatric:        Mood and Affect: Mood normal.    ED Results / Procedures / Treatments   Labs (all labs ordered are listed, but only abnormal results are displayed) Labs Reviewed  BASIC METABOLIC PANEL - Abnormal; Notable for the following components:      Result Value   Sodium 132 (*)    Potassium 5.6 (*)    Chloride 97 (*)    CO2 <7 (*)    Glucose, Bld 554 (*)    BUN 22 (*)    Creatinine, Ser 1.59 (*)    All other components within normal limits  CBC - Abnormal; Notable for the following components:   WBC 14.8 (*)    All other components within  normal limits  BETA-HYDROXYBUTYRIC ACID - Abnormal; Notable for the following components:   Beta-Hydroxybutyric Acid >8.00 (*)    All other components within normal limits  CBG MONITORING, ED - Abnormal; Notable for the following components:   Glucose-Capillary 542 (*)    All other components within normal limits  CBG MONITORING, ED - Abnormal; Notable for the following components:   Glucose-Capillary 541 (*)    All other components within normal limits  I-STAT VENOUS BLOOD GAS, ED - Abnormal; Notable for the following components:   pH, Ven 7.212 (*)    pCO2, Ven 21.4 (*)  pO2, Ven 141.0 (*)    Bicarbonate 8.6 (*)    TCO2 9 (*)    Acid-base deficit 17.0 (*)    Sodium 132 (*)    Potassium 5.3 (*)    Calcium, Ion 1.14 (*)    Hemoglobin 17.7 (*)    All other components within normal limits  CBG MONITORING, ED - Abnormal; Notable for the following components:   Glucose-Capillary 528 (*)    All other components within normal limits  RESP PANEL BY RT-PCR (FLU A&B, COVID) ARPGX2  URINALYSIS, ROUTINE W REFLEX MICROSCOPIC  BASIC METABOLIC PANEL  BASIC METABOLIC PANEL  BASIC METABOLIC PANEL    EKG None  Radiology No results found.  Procedures .Critical Care E&M Performed by: Azucena Cecil, PA  Critical care provider statement:    Critical care time (minutes):  30   Critical care time was exclusive of:  Separately billable procedures and treating other patients   Critical care was necessary to treat or prevent imminent or life-threatening deterioration of the following conditions:  Endocrine crisis   Critical care was time spent personally by me on the following activities:  Development of treatment plan with patient or surrogate, discussions with consultants, evaluation of patient's response to treatment, examination of patient, obtaining history from patient or surrogate, review of old charts, re-evaluation of patient's condition, pulse oximetry, ordering and review of  laboratory studies and ordering and performing treatments and interventions   Care discussed with: admitting provider   After initial E/M assessment, critical care services were subsequently performed that were exclusive of separately billable procedures or treatment.     Medications Ordered in ED Medications  insulin regular, human (MYXREDLIN) 100 units/ 100 mL infusion (13 Units/hr Intravenous Rate/Dose Change 03/21/21 1403)  lactated ringers infusion ( Intravenous New Bag/Given 03/21/21 1405)  dextrose 5 % in lactated ringers infusion (has no administration in time range)  dextrose 50 % solution 0-50 mL (has no administration in time range)  lactated ringers bolus 1,000 mL (0 mLs Intravenous Stopped 03/21/21 1403)  ondansetron (ZOFRAN) injection 4 mg (4 mg Intravenous Given 03/21/21 1351)    ED Course  I have reviewed the triage vital signs and the nursing notes.  Pertinent labs & imaging results that were available during my care of the patient were reviewed by me and considered in my medical decision making (see chart for details).    MDM Rules/Calculators/A&P                          19YOM with type I DM who presents due to nausea and vomiting, presumed hyperglycemia. Patient is concerning for DKA. Patient is ill appearing on arrival to ED with tachycardia. Patient extremely lethargic and slow to respond to provider.  Patient DKA protocol/order set initiated based on lab results received in triage. Labs notable for pH of 7.2, BG of 541, Bicarb of 8.6 and AKI with creatinine 1.59. These findings are consistent with DKA. Patient administered 75m of IV Zofran for nausea and started on insulin drip as well as fluid bolus. Case discussed with hospitalist who agrees to admit.   Final Clinical Impression(s) / ED Diagnoses Final diagnoses:  Type 1 diabetes mellitus with ketoacidosis without coma ([facility]    Rx / DC Orders ED Discharge Orders     None        GAzucena Cecil  PUtah11/08/22 1459    DLucrezia Starch MD 03/22/21 1434

## 2021-03-21 NOTE — ED Notes (Signed)
Patient transported to X-ray 

## 2021-03-21 NOTE — Progress Notes (Signed)
Inpatient Diabetes Program Recommendations  AACE/ADA: New Consensus Statement on Inpatient Glycemic Control (2015)  Target Ranges:  Prepandial:   less than 140 mg/dL      Peak postprandial:   less than 180 mg/dL (1-2 hours)      Critically ill patients:  140 - 180 mg/dL   Lab Results  Component Value Date   GLUCAP 528 (HH) 03/21/2021   HGBA1C 12.8 (A) 01/10/2021    Review of Glycemic Control Results for Ricky Shaw, Ricky Shaw (MRN [id number]) as of 03/21/2021 14:51  Ref. Range 03/21/2021 11:35  Potassium Latest Ref Range: 3.5 - 5.1 mmol/L 5.6 (H)  Chloride Latest Ref Range: 98 - 111 mmol/L 97 (L)  CO2 Latest Ref Range: 22 - 32 mmol/L <7 (L)  Glucose Latest Ref Range: 70 - 99 mg/dL 355 (HH)  BUN Latest Ref Range: 6 - 20 mg/dL 22 (H)  Creatinine Latest Ref Range: 0.61 - 1.24 mg/dL 9.74 (H)  Calcium Latest Ref Range: 8.9 - 10.3 mg/dL 16.3  Anion gap Latest Ref Range: 5 - 15  NOT CALCULATED   Diabetes history: DM1 Outpatient Diabetes medications:  T slim insulin pump Current orders for Inpatient glycemic control: IV insulin  Inpatient Diabetes Program Recommendations:   Patient sees Gretchen Short regarding diabetes management and last office visit was 02/07/21.  Patient states when his blood glucose started to elevate, he changed out his insulin pump site but glucose continued to rise. Reviewed with patient will need to replace insulin pump insertion site when ready to restart insulin pump to assure insulin site is infusing properly. Patient verbalized understanding and his mom will plan to bring supplies to have available when transitioning. Patient's grandma @ bedside and mom is coming later today to the hospital.  Insulin regimen: Tsim insulin pump  - Basal Rates 12AM 1.75  3am 1.85  6am 1.90  8am 2.1   9pm   2.1     48.8 units per day    Insulin to Carbohydrate Ratio 12AM 10  6am 4  11am 4  4pm 4           Insulin Sensitivity Factor 12AM 24  3am 24  6am 24  7am 20   8am 9pm 11pm 20 20 24     Target Blood Glucose 12AM 150  6am 110  9pm 150              Thank you, Raelee Rossmann E. Lynzee Lindquist, RN, MSN, CDE  Diabetes Coordinator Inpatient Glycemic Control Team Team Pager [phone] (8am-5pm) 03/21/2021 2:55 PM

## 2021-03-21 NOTE — H&P (Signed)
History and Physical    Ricky Shaw PNT:[id number] DOB: 17-Feb-2001 DOA: 03/21/2021  PCP: [name], MD (Confirm with patient/family/NH records and if not entered, this has to be entered at [facility] point of entry) Patient coming from: Home  I have personally briefly reviewed patient's old medical records in [place]  Chief Complaint: N/V, abd pain, headache.  HPI: Ricky Shaw is a 20 y.o. male with medical history significant of IDDM on insulin pump, came with new onset of nauseous vomiting, headache abdominal pain.  Symptoms started this morning, woke up with nauseous vomiting, cramping-like abdominal pain and dull-like headache.  Vomiting stomach content, nonbloody nonbilious.  No fever chills no chest pain or shortness of breath.  No diarrhea.  Patient has been using insulin pump for more than 1 year, follow-up with endocrinologist.  But recently, patient noticed at his fingerstick reading has been "up and down from 100s-300s".  Insulin pump was malfunctioning this morning.  ED Course: Tachycardia, no fever.  Glucose> 500, increased beta hydroxy, bicarb less than 7, VBG seven-point 2/21/141.  Review of Systems: As per HPI otherwise 14 point review of systems negative.    Past Medical History:  Diagnosis Date   Diabetes mellitus 11/14/2011   Diabetes mellitus without complication ([place])    Phreesia 03/03/2020    Past Surgical History:  Procedure Laterality Date   nasal cauterization     NASAL HEMORRHAGE CONTROL  05/14/2006     reports that he has never smoked. He has never used smokeless tobacco. He reports current drug use. Drug: Marijuana. He reports that he does not drink alcohol.  Allergies  Allergen Reactions   Amoxicillin     Does not recall what reaction was Mother called MD to find allergy. Confirms amoxicillin    Family History  Problem Relation Age of Onset   Asthma Maternal Aunt    Cancer Maternal Grandfather    Diabetes Paternal  Grandmother    Hypertension Other    Thyroid disease Neg Hx      Prior to Admission medications   Medication Sig Start Date End Date Taking? Authorizing Provider  Accu-Chek Softclix Lancets lancets Use as instructed 04/21/20   Levon Hedger, MD  Blood Glucose Monitoring Suppl (ACCU-CHEK GUIDE ME) w/Device KIT 1 kit by Does not apply route as directed. 04/21/20   Levon Hedger, MD  Continuous Blood Gluc Receiver (DEXCOM G6 RECEIVER) DEVI 1 Device by Does not apply route as directed. 12/21/19   Hermenia Bers, NP  Continuous Blood Gluc Sensor (DEXCOM G6 SENSOR) MISC INJECT 1 APPLICATOR INTO THE SKIN AS DIRECTED(CHANGE SENSOR EVERY 10 DAYS) 01/09/21   Al Corpus, MD  Continuous Blood Gluc Transmit (DEXCOM G6 TRANSMITTER) MISC INJECT 1 DEVICE INTO THE SKIN AS DIRECTED( RE-USE UP TP 8X WITH EACH NEW SENSOR) 03/10/21   Hermenia Bers, NP  glucagon 1 MG injection Use for Severe Hypoglycemia, if unresponsive, unable to swallow, unconscious and/or has seizure 09/05/16   Hermenia Bers, NP  glucose blood (ACCU-CHEK GUIDE) test strip Use as instructed 04/21/20   Levon Hedger, MD  insulin aspart (NOVOLOG FLEXPEN) 100 UNIT/ML FlexPen Back up for pump malfunction 08/25/18   Hermenia Bers, NP  Insulin Glargine (LANTUS SOLOSTAR) 100 UNIT/ML Solostar Pen Use up to 50 units daily as directed by MD 05/29/16   Hermenia Bers, NP  Insulin Human (INSULIN PUMP) SOLN Inject 1 each into the skin See admin instructions. Use with Humalog. Patient not taking: Reported on 02/07/2021  [provider]  insulin lispro (HUMALOG) 100 UNIT/ML injection Use 300 units in insulin pump every 48 hours 02/02/21   Hermenia Bers, NP    Physical Exam: Vitals:   03/21/21 1315 03/21/21 1400 03/21/21 1415 03/21/21 1427  BP: (!) 123/56 (!) 141/72 (!) 141/74   Pulse: 100 (!) 105 (!) 103   Resp: _0 Temp:      TempSrc:      SpO2: 98% 100% 100%   Weight:    86.2 kg  Height:     _1  (1.651 m)    Constitutional: NAD, calm, comfortable Vitals:   03/21/21 1315 03/21/21 1400 03/21/21 1415 03/21/21 1427  BP: (!) 123/56 (!) 141/72 (!) 141/74   Pulse: 100 (!) 105 (!) 103   Resp: _2 Temp:      TempSrc:      SpO2: 98% 100% 100%   Weight:    86.2 kg  Height:    _3  (1.651 m)   Eyes: PERRL, lids and conjunctivae normal ENMT: Mucous membranes are dry. Posterior pharynx clear of any exudate or lesions.Normal dentition.  Neck: normal, supple, no masses, no thyromegaly Respiratory: clear to auscultation bilaterally, no wheezing, no crackles. Normal respiratory effort. No accessory muscle use.  Cardiovascular: Regular rate and rhythm, no murmurs / rubs / gallops. No extremity edema. 2+ pedal pulses. No carotid bruits.  Abdomen: mild tenderness on all 4 quadrants, no rebound no guarding, no masses palpated. No hepatosplenomegaly. Bowel sounds positive.  Musculoskeletal: no clubbing / cyanosis. No joint deformity upper and lower extremities. Good ROM, no contractures. Normal muscle tone.  Skin: no rashes, lesions, ulcers. No induration Neurologic: CN 2-12 grossly intact. Sensation intact, DTR normal. Strength 5/5 in all 4.  Psychiatric: Normal judgment and insight. Alert and oriented x 3. Normal mood.     Labs on Admission: I have personally reviewed following labs and imaging studies  CBC: Recent Labs  Lab 03/21/21 1135 03/21/21 1149  WBC 14.8*  --   HGB 15.9 17.7*  HCT 49.7 52.0  MCV 91.9  --   PLT 363  --    Basic Metabolic Panel: Recent Labs  Lab 03/21/21 1135 03/21/21 1149  NA 132* 132*  K 5.6* 5.3*  CL 97*  --   CO2 <7*  --   GLUCOSE 554*  --   BUN 22*  --   CREATININE 1.59*  --   CALCIUM 10.1  --    GFR: Estimated Creatinine Clearance: 76.1 mL/min (A) (by C-G formula based on SCr of 1.59 mg/dL (H)). Liver Function Tests: No results for input(s): AST, ALT, ALKPHOS, BILITOT, PROT, ALBUMIN in the last 168 hours. No results for  input(s): LIPASE, AMYLASE in the last 168 hours. No results for input(s): AMMONIA in the last 168 hours. Coagulation Profile: No results for input(s): INR, PROTIME in the last 168 hours. Cardiac Enzymes: No results for input(s): CKTOTAL, CKMB, CKMBINDEX, TROPONINI in the last 168 hours. BNP (last 3 results) No results for input(s): PROBNP in the last 8760 hours. HbA1C: No results for input(s): HGBA1C in the last 72 hours. CBG: Recent Labs  Lab 03/21/21 1124 03/21/21 1305 03/21/21 1401 03/21/21 1434  GLUCAP 542* 541* 528* 420*   Lipid Profile: No results for input(s): CHOL, HDL, LDLCALC, TRIG, CHOLHDL, LDLDIRECT in the last 72 hours. Thyroid Function Tests: No results for input(s): TSH, T4TOTAL, FREET4, T3FREE, THYROIDAB in the last 72 hours. Anemia Panel: No results for input(s): VITAMINB12, FOLATE,  FERRITIN, TIBC, IRON, RETICCTPCT in the last 72 hours. Urine analysis:    Component Value Date/Time   COLORURINE STRAW (A) 03/21/2021 1358   APPEARANCEUR CLEAR 03/21/2021 1358   LABSPEC 1.021 03/21/2021 1358   PHURINE 5.0 03/21/2021 1358   GLUCOSEU >=500 (A) 03/21/2021 1358   HGBUR MODERATE (A) 03/21/2021 1358   BILIRUBINUR NEGATIVE 03/21/2021 1358   KETONESUR 80 (A) 03/21/2021 1358   PROTEINUR 30 (A) 03/21/2021 1358   UROBILINOGEN 0.2 09/04/2014 1240   NITRITE NEGATIVE 03/21/2021 1358   LEUKOCYTESUR NEGATIVE 03/21/2021 1358    Radiological Exams on Admission: No results found.  EKG: Independently reviewed.  Sinus tachycardia cardia, no acute ST changes.  Assessment/Plan Active Problems:   DKA (diabetic ketoacidosis) ([place])  (please populate well all problems here in Problem List. (For example, if patient is on BP meds at home and you resume or decide to hold them, it is a problem that needs to be her. Same for CAD, COPD, HLD and so on)  DKA -Suspect insulin pump, consult diabetic coordinator. -Insulin drip with Endo tool  -Given the most recent A1c 2 months ago of  12.4, raise a concern about the insulin pump regimen likely will need some re-adjustment, d/w patient's mom and grandma at bedside.  Leukocytosis -Likely from DKA, check lipase, check abdominal x-ray.  Hyperkalemia -Secondary to DKA, re-evaluate in AM.  DVT prophylaxis: Lovenox Code Status: Full code Family Communication: Mother and grandmother Disposition Plan: Expect 1-2 days hospital stay Consults called: None Admission status: PCU   Lequita Halt MD [facility] Pager [phone]  03/21/2021, 2:42 PM

## 2021-03-21 NOTE — ED Provider Notes (Signed)
Emergency Medicine Provider Triage Evaluation Note  Ricky Shaw , a 20 y.o. male  was evaluated in triage.  Patient with a history of type 1 diabetes presenting today with nausea, vomiting, dizziness and overall discomfort.  Reports that he has been taking his insulin as he is supposed to and his blood sugars have looked good at home.  Review of Systems  Positive: Nausea, vomiting, dizziness Negative: SOB  Physical Exam  BP 127/76 (BP Location: Right Arm)   Pulse (!) 111   Temp 98.6 F (37 C) (Oral)   Resp 19   SpO2 98%  Gen:   Awake, actively vomiting and rolling in wheelchair. Resp:  Normal effort  MSK:   Moves extremities without difficulty  Other:    Medical Decision Making  Medically screening exam initiated at 11:27 AM.  Appropriate orders placed.  Ricky Shaw was informed that the remainder of the evaluation will be completed by another provider, this initial triage assessment does not replace that evaluation, and the importance of remaining in the ED until their evaluation is complete.  Blood sugar in the 500s at this moment.   Ricky Shaw 03/21/21 1129    Milagros Loll, MD 03/21/21 [phone]

## 2021-03-21 NOTE — ED Triage Notes (Signed)
EMS stated, he has had N/V since this morning. He said he took his Insulin today but not sure.

## 2021-03-21 NOTE — ED Notes (Signed)
Pt. Stated, I took 13 Units of Insulin this morning

## 2021-03-22 ENCOUNTER — Other Ambulatory Visit: Payer: Self-pay

## 2021-03-22 ENCOUNTER — Encounter (HOSPITAL_COMMUNITY): Payer: Self-pay | Admitting: Internal Medicine

## 2021-03-22 LAB — GLUCOSE, CAPILLARY
Glucose-Capillary: 133 mg/dL — ABNORMAL HIGH (ref 70–99)
Glucose-Capillary: 147 mg/dL — ABNORMAL HIGH (ref 70–99)
Glucose-Capillary: 156 mg/dL — ABNORMAL HIGH (ref 70–99)
Glucose-Capillary: 166 mg/dL — ABNORMAL HIGH (ref 70–99)
Glucose-Capillary: 166 mg/dL — ABNORMAL HIGH (ref 70–99)
Glucose-Capillary: 168 mg/dL — ABNORMAL HIGH (ref 70–99)
Glucose-Capillary: 169 mg/dL — ABNORMAL HIGH (ref 70–99)
Glucose-Capillary: 172 mg/dL — ABNORMAL HIGH (ref 70–99)
Glucose-Capillary: 173 mg/dL — ABNORMAL HIGH (ref 70–99)
Glucose-Capillary: 174 mg/dL — ABNORMAL HIGH (ref 70–99)
Glucose-Capillary: 177 mg/dL — ABNORMAL HIGH (ref 70–99)
Glucose-Capillary: 185 mg/dL — ABNORMAL HIGH (ref 70–99)
Glucose-Capillary: 187 mg/dL — ABNORMAL HIGH (ref 70–99)
Glucose-Capillary: 187 mg/dL — ABNORMAL HIGH (ref 70–99)
Glucose-Capillary: 249 mg/dL — ABNORMAL HIGH (ref 70–99)
Glucose-Capillary: 295 mg/dL — ABNORMAL HIGH (ref 70–99)
Glucose-Capillary: 297 mg/dL — ABNORMAL HIGH (ref 70–99)
Glucose-Capillary: 297 mg/dL — ABNORMAL HIGH (ref 70–99)

## 2021-03-22 LAB — LIPID PANEL
Cholesterol: 183 mg/dL — ABNORMAL HIGH (ref 0–169)
HDL: 28 mg/dL — ABNORMAL LOW (ref >40)
LDL Cholesterol: 114 mg/dL — ABNORMAL HIGH (ref 0–99)
Total CHOL/HDL Ratio: 6.5 RATIO
Triglycerides: 207 mg/dL — ABNORMAL HIGH (ref <150)
VLDL: 41 mg/dL — ABNORMAL HIGH (ref 0–40)

## 2021-03-22 LAB — BASIC METABOLIC PANEL
Anion gap: 16 — ABNORMAL HIGH (ref 5–15)
Anion gap: 15 (ref 5–15)
Anion gap: 12 (ref 5–15)
Anion gap: 14 (ref 5–15)
BUN: 17 mg/dL (ref 6–20)
BUN: 16 mg/dL (ref 6–20)
BUN: 13 mg/dL (ref 6–20)
BUN: 12 mg/dL (ref 6–20)
CO2: 15 mmol/L — ABNORMAL LOW (ref 22–32)
CO2: 14 mmol/L — ABNORMAL LOW (ref 22–32)
CO2: 20 mmol/L — ABNORMAL LOW (ref 22–32)
CO2: 18 mmol/L — ABNORMAL LOW (ref 22–32)
Calcium: 9.2 mg/dL (ref 8.9–10.3)
Calcium: 8.9 mg/dL (ref 8.9–10.3)
Calcium: 9.1 mg/dL (ref 8.9–10.3)
Calcium: 8.7 mg/dL — ABNORMAL LOW (ref 8.9–10.3)
Chloride: 106 mmol/L (ref 98–111)
Chloride: 106 mmol/L (ref 98–111)
Chloride: 105 mmol/L (ref 98–111)
Chloride: 102 mmol/L (ref 98–111)
Creatinine, Ser: 1.36 mg/dL — ABNORMAL HIGH (ref 0.61–1.24)
Creatinine, Ser: 1.23 mg/dL (ref 0.61–1.24)
Creatinine, Ser: 1.22 mg/dL (ref 0.61–1.24)
Creatinine, Ser: 1.27 mg/dL — ABNORMAL HIGH (ref 0.61–1.24)
GFR, Estimated: >60 mL/min (ref >60)
GFR, Estimated: >60 mL/min (ref >60)
GFR, Estimated: >60 mL/min (ref >60)
GFR, Estimated: >60 mL/min (ref >60)
Glucose, Bld: 193 mg/dL — ABNORMAL HIGH (ref 70–99)
Glucose, Bld: 168 mg/dL — ABNORMAL HIGH (ref 70–99)
Glucose, Bld: 170 mg/dL — ABNORMAL HIGH (ref 70–99)
Glucose, Bld: 300 mg/dL — ABNORMAL HIGH (ref 70–99)
Potassium: 4.4 mmol/L (ref 3.5–5.1)
Potassium: 4.3 mmol/L (ref 3.5–5.1)
Potassium: 3.9 mmol/L (ref 3.5–5.1)
Potassium: 3.6 mmol/L (ref 3.5–5.1)
Sodium: 137 mmol/L (ref 135–145)
Sodium: 135 mmol/L (ref 135–145)
Sodium: 137 mmol/L (ref 135–145)
Sodium: 134 mmol/L — ABNORMAL LOW (ref 135–145)

## 2021-03-22 LAB — HEMOGLOBIN A1C
Hgb A1c MFr Bld: 10.4 % — ABNORMAL HIGH (ref 4.8–5.6)
Mean Plasma Glucose: 251.78 mg/dL

## 2021-03-22 LAB — RAPID URINE DRUG SCREEN, HOSP PERFORMED
Amphetamines: NOT DETECTED
Barbiturates: NOT DETECTED
Benzodiazepines: NOT DETECTED
Cocaine: NOT DETECTED
Opiates: NOT DETECTED
Tetrahydrocannabinol: POSITIVE — AB

## 2021-03-22 MED ORDER — DEXTROSE IN LACTATED RINGERS 5 % IV SOLN: INTRAVENOUS | Status: DC

## 2021-03-22 NOTE — Progress Notes (Signed)
PROGRESS NOTE  Ricky Shaw  AGT:[id number] DOB: [birth year] DOA: 03/21/2021 PCP: [name], MD  Outpatient Specialists: Endocrinology, Gretchen Short, NP Brief Narrative: Ricky Shaw is a 20 y.o. male with a history of T1DM on insulin pump who presented with worsening nausea, vomiting, abdominal pain in the setting of having malfunctioning insulin pump, possibly kinked tubing. He was found to be in DKA treated with IV fluids and IV insulin with eventual improvement.  Assessment & Plan: Active Problems:   DKA (diabetic ketoacidosis) (HCC)  DKA in T1DM possibly precipitated by insulin pump malfunction: Acidosis has improved on IV insulin, though sluggish. Also still appears dry clinically. Reordered IV fluids with dextrose to assist with ketone clearance and dehydration. Recheck metabolic panel later today. Once metabolic parameters improved and insulin pump available, will transition to the pump and corroborate its normal functioning prior to discharge. Diabetes coordinator following patient. Needs close endo follow up.   Leukocytosis: Reactive presumed since no nidus of infection identified.   DVT prophylaxis: Lovenox Code Status: Full Family Communication: None at bedside Disposition Plan:  Status is: Inpatient  Remains inpatient appropriate because: Continued management of DKA in T1DM  Consultants:  None  Procedures:  None  Antimicrobials: None   Subjective: Feeling some better, still nauseated, eating only a little. Headache is mild-moderate. Urine still dark.  Objective: Vitals:   03/22/21 0403 03/22/21 0754 03/22/21 1106 03/22/21 1622  BP: 122/62 124/62 (!) 116/58 (!) 128/58  Pulse: 81 84 81 73  Resp: 16 18 19 18   Temp: 98.6 F (37 C) 98 F (36.7 C) 98.1 F (36.7 C) 98.4 F (36.9 C)  TempSrc: Oral Oral Oral Oral  SpO2: 100% 97% 100% 97%  Weight:      Height:        Intake/Output Summary (Last 24 hours) at 03/22/2021 1638 Last data filed at  03/22/2021 1600 Gross per 24 hour  Intake 4545.56 ml  Output 550 ml  Net 3995.56 ml   Filed Weights   03/21/21 1427 03/21/21 2036  Weight: 86.2 kg 75.8 kg    Gen: 20 y.o. male in no distress HEENT: Dry mucous membranes. Pulm: Non-labored breathing room air. Clear to auscultation bilaterally.  CV: Regular rate and rhythm. No murmur, rub, or gallop. No JVD, no pedal edema. GI: Abdomen soft, non-tender, non-distended, with normoactive bowel sounds. No organomegaly or masses felt. Ext: Warm, no deformities Skin: No rashes, lesions or ulcers Neuro: Alert and oriented. No focal neurological deficits. Psych: Judgement and insight appear normal. Mood & affect appropriate.   Data Reviewed: I have personally reviewed following labs and imaging studies  CBC: Recent Labs  Lab 03/21/21 1135 03/21/21 1149  WBC 14.8*  --   HGB 15.9 17.7*  HCT 49.7 52.0  MCV 91.9  --   PLT 363  --    Basic Metabolic Panel: Recent Labs  Lab 03/21/21 1135 03/21/21 1149 03/21/21 1600 03/22/21 0020 03/22/21 0412 03/22/21 0808  NA 132* 132* 137 137 135 137  K 5.6* 5.3* 5.5* 4.4 4.3 3.9  CL 97*  --  106 106 106 105  CO2 <7*  --  9* 15* 14* 20*  GLUCOSE 554*  --  217* 193* 168* 170*  BUN 22*  --  19 17 16 13   CREATININE 1.59*  --  1.55* 1.36* 1.23 1.22  CALCIUM 10.1  --  9.5 9.2 8.9 9.1   GFR: Estimated Creatinine Clearance: 93.3 mL/min (by C-G formula based on SCr of 1.22 mg/dL). Liver Function  Tests: No results for input(s): AST, ALT, ALKPHOS, BILITOT, PROT, ALBUMIN in the last 168 hours. Recent Labs  Lab 03/21/21 1600  LIPASE 18   No results for input(s): AMMONIA in the last 168 hours. Coagulation Profile: No results for input(s): INR, PROTIME in the last 168 hours. Cardiac Enzymes: No results for input(s): CKTOTAL, CKMB, CKMBINDEX, TROPONINI in the last 168 hours. BNP (last 3 results) No results for input(s): PROBNP in the last 8760 hours. HbA1C: Recent Labs    03/22/21 0020   HGBA1C 10.4*   CBG: Recent Labs  Lab 03/22/21 1104 03/22/21 1214 03/22/21 1339 03/22/21 1429 03/22/21 1553  GLUCAP 297* 147* 133* 187* 295*   Lipid Profile: Recent Labs    03/22/21 0020  CHOL 183*  HDL 28*  LDLCALC 114*  TRIG 207*  CHOLHDL 6.5   Thyroid Function Tests: No results for input(s): TSH, T4TOTAL, FREET4, T3FREE, THYROIDAB in the last 72 hours. Anemia Panel: No results for input(s): VITAMINB12, FOLATE, FERRITIN, TIBC, IRON, RETICCTPCT in the last 72 hours. Urine analysis:    Component Value Date/Time   COLORURINE STRAW (A) 03/21/2021 1358   APPEARANCEUR CLEAR 03/21/2021 1358   LABSPEC 1.021 03/21/2021 1358   PHURINE 5.0 03/21/2021 1358   GLUCOSEU >=500 (A) 03/21/2021 1358   HGBUR MODERATE (A) 03/21/2021 1358   BILIRUBINUR NEGATIVE 03/21/2021 1358   KETONESUR 80 (A) 03/21/2021 1358   PROTEINUR 30 (A) 03/21/2021 1358   UROBILINOGEN 0.2 09/04/2014 1240   NITRITE NEGATIVE 03/21/2021 1358   LEUKOCYTESUR NEGATIVE 03/21/2021 1358   Recent Results (from the past 240 hour(s))  Resp Panel by RT-PCR (Flu A&B, Covid) Nasopharyngeal Swab     Status: None   Collection Time: 03/21/21 12:46 PM   Specimen: Nasopharyngeal Swab; Nasopharyngeal(NP) swabs in vial transport medium  Result Value Ref Range Status   SARS Coronavirus 2 by RT PCR NEGATIVE NEGATIVE Final    Comment: (NOTE) SARS-CoV-2 target nucleic acids are NOT DETECTED.  The SARS-CoV-2 RNA is generally detectable in upper respiratory specimens during the acute phase of infection. The lowest concentration of SARS-CoV-2 viral copies this assay can detect is 138 copies/mL. A negative result does not preclude SARS-Cov-2 infection and should not be used as the sole basis for treatment or other patient management decisions. A negative result may occur with  improper specimen collection/handling, submission of specimen other than nasopharyngeal swab, presence of viral mutation(s) within the areas targeted by  this assay, and inadequate number of viral copies(<138 copies/mL). A negative result must be combined with clinical observations, patient history, and epidemiological information. The expected result is Negative.  Fact Sheet for Patients:  BloggerCourse.com  Fact Sheet for Healthcare Providers:  SeriousBroker.it  This test is no t yet approved or cleared by the [place] FDA and  has been authorized for detection and/or diagnosis of SARS-CoV-2 by FDA under an Emergency Use Authorization (EUA). This EUA will remain  in effect (meaning this test can be used) for the duration of the COVID-19 declaration under Section 564(b)(1) of the Act, 21 U.S.C.section 360bbb-3(b)(1), unless the authorization is terminated  or revoked sooner.       Influenza A by PCR NEGATIVE NEGATIVE Final   Influenza B by PCR NEGATIVE NEGATIVE Final    Comment: (NOTE) The Xpert Xpress SARS-CoV-2/FLU/RSV plus assay is intended as an aid in the diagnosis of influenza from Nasopharyngeal swab specimens and should not be used as a sole basis for treatment. Nasal washings and aspirates are unacceptable for Xpert Xpress SARS-CoV-2/FLU/RSV testing.  Fact Sheet for Patients: BloggerCourse.com  Fact Sheet for Healthcare Providers: SeriousBroker.it  This test is not yet approved or cleared by the [place] FDA and has been authorized for detection and/or diagnosis of SARS-CoV-2 by FDA under an Emergency Use Authorization (EUA). This EUA will remain in effect (meaning this test can be used) for the duration of the COVID-19 declaration under Section 564(b)(1) of the Act, 21 U.S.C. section 360bbb-3(b)(1), unless the authorization is terminated or revoked.  Performed at [facility], [address] [place], [place] [address]       Radiology Studies: DG Chest 1 View  Result Date: 03/21/2021 CLINICAL  DATA:  Chest pain since this morning EXAM: CHEST  1 VIEW COMPARISON:  Portable exam 1735 hours without priors for comparison FINDINGS: Normal heart size, mediastinal contours, and pulmonary vascularity. Lungs clear. No pulmonary infiltrate, pleural effusion, or pneumothorax. Osseous structures unremarkable. IMPRESSION: No acute abnormalities. Electronically Signed   By: Ulyses Southward M.D.   On: 03/21/2021 18:32   DG Abd 1 View  Result Date: 03/21/2021 CLINICAL DATA:  Type 1 diabetes.  Nausea, vomiting and dizziness. EXAM: ABDOMEN - 1 VIEW COMPARISON:  08/11/2005 FINDINGS: Normal bowel gas pattern. No large abdominopelvic calcifications. No acute bone abnormality. IMPRESSION: Negative. Electronically Signed   By: Richarda Overlie M.D.   On: 03/21/2021 15:44    Scheduled Meds:  enoxaparin (LOVENOX) injection  40 mg Subcutaneous Q24H   Continuous Infusions:  dextrose 5% lactated ringers 125 mL/hr at 03/22/21 1600   insulin 6 Units/hr (03/22/21 1600)     LOS: 1 day   Time spent: 25 minutes.  Ricky Nine, MD [facility] [email or web address] 03/22/2021, 4:38 PM

## 2021-03-22 NOTE — Progress Notes (Signed)
Inpatient Diabetes Program Recommendations  AACE/ADA: New Consensus Statement on Inpatient Glycemic Control (2015)  Target Ranges:  Prepandial:   less than 140 mg/dL      Peak postprandial:   less than 180 mg/dL (1-2 hours)      Critically ill patients:  140 - 180 mg/dL   Lab Results  Component Value Date   GLUCAP 297 (H) 03/22/2021   HGBA1C 10.4 (H) 03/22/2021    Spoke with pt at bedside and specifically asked if the pt needed to contact the insulin pump company to make sure the device was working properly. Pt reports he does not think it is the pump itself. Pt reports even though he changed his insulin pump site, he thinks it still was kinked and not inserted right as the pump was giving him messages to check cartridge or change insertion site. Pt also reported when he took the insulin pump site out the tube was slightly kinked.  Pt to have mom bring insulin pump supplies to transition to insulin pump. Overlap insertion time with insulin pump x1 hour and then turn off IV insulin.   Pt to follow up with pediatric subspecialists Endocrinology group, Barron Alvine, NP.  Thanks,  Christena Deem RN, MSN, BC-ADM Inpatient Diabetes Coordinator Team Pager [phone] (8a-5p)

## 2021-03-22 NOTE — Progress Notes (Signed)
TR[facility] night cross cover note:  I was contacted by RN who provided update that the patient is tolerating ice chips via current n.p.o. with ice chips diet, and requesting clarification on plan regarding diet.  Per chart review, this appears to be an 20 year old male who was admitted earlier this evening with DKA.  Per updated BMP, it appears that the anion gap is closing.  In light of this and given that the patient tolerating ice chips, will place updated diet order for clear liquid diet, with associated instruction to advance as tolerated to full diabetic diet, and I conveyed this plan to the patient's RN.    Newton Pigg, DO Hospitalist

## 2021-03-23 LAB — BASIC METABOLIC PANEL
Anion gap: 9 (ref 5–15)
BUN: 16 mg/dL (ref 6–20)
CO2: 22 mmol/L (ref 22–32)
Calcium: 8.9 mg/dL (ref 8.9–10.3)
Chloride: 107 mmol/L (ref 98–111)
Creatinine, Ser: 1.02 mg/dL (ref 0.61–1.24)
GFR, Estimated: >60 mL/min (ref >60)
Glucose, Bld: 111 mg/dL — ABNORMAL HIGH (ref 70–99)
Potassium: 3.4 mmol/L — ABNORMAL LOW (ref 3.5–5.1)
Sodium: 138 mmol/L (ref 135–145)

## 2021-03-23 LAB — GLUCOSE, CAPILLARY: Glucose-Capillary: 117 mg/dL — ABNORMAL HIGH (ref 70–99)

## 2021-03-23 MED ORDER — POTASSIUM CHLORIDE CRYS ER 20 MEQ PO TBCR
20.0000 meq | EXTENDED_RELEASE_TABLET | Freq: Once | ORAL | Status: AC
  Administered 2021-03-23: 20 meq via ORAL
  Filled 2021-03-23: qty 1

## 2021-03-23 NOTE — Plan of Care (Signed)

## 2021-03-23 NOTE — Discharge Summary (Signed)
Physician Discharge Summary  Ricky Shaw VZD:[id number] DOB: Nov 01, 2000 DOA: 03/21/2021  PCP: [name], MD  Admit date: 03/21/2021 Discharge date: 03/23/2021  Admitted From: Home Disposition: Home   Recommendations for Outpatient Follow-up:  Follow up with endocrinology in 1-2 weeks  Home Health: None Equipment/Devices: None new, continue insulin pump Discharge Condition: Stable CODE STATUS: Full Diet recommendation: Carb-modified  Makel Z Hosley is a 20 y.o. male with a history of T1DM on insulin pump who presented with worsening nausea, vomiting, abdominal pain in the setting of having malfunctioning insulin pump, possibly kinked tubing. He was found to be in DKA treated with IV fluids and IV insulin with eventual improvement. Insulin pump was restarted and glycemic control has remained at goal, appearing to function appropriately at time of discharge.   Discharge Diagnoses:  Active Problems:   DKA (diabetic ketoacidosis) ([place])  DKA in T1DM possibly precipitated by insulin pump malfunction: Acidosis has improved, now appears euvolemic with resolved symptoms. Durably improved labs and glucose level off IV insulin and on pump alone.    Leukocytosis: Reactive presumed since no nidus of infection identified.    Discharge Instructions Discharge Instructions     Diet Carb Modified   Complete by: As directed    Discharge instructions   Complete by: As directed    You were admitted for DKA thought to be due to malfunctioning insulin pump. Restarting pump has been confirmed to be operational and you are stable for discharge with plans to follow up with endocrinology in the next 1-2 weeks. If you get error messages, seek medical attention right away, do not delay. If insulin pump is not working, use back up insulins as directed.   Increase activity slowly   Complete by: As directed       Allergies as of 03/23/2021       Reactions   Amoxicillin    Does not recall  what reaction was Mother called MD to find allergy. Confirms amoxicillin        Medication List     TAKE these medications    Accu-Chek Guide Me w/Device Kit 1 kit by Does not apply route as directed.   Accu-Chek Guide test strip Generic drug: glucose blood Use as instructed   Accu-Chek Softclix Lancets lancets Use as instructed   Dexcom G6 Receiver Devi 1 Device by Does not apply route as directed.   Dexcom G6 Sensor Misc INJECT 1 APPLICATOR INTO THE SKIN AS DIRECTED(CHANGE SENSOR EVERY 10 DAYS)   Dexcom G6 Transmitter Misc INJECT 1 DEVICE INTO THE SKIN AS DIRECTED( RE-USE UP TP 8X WITH EACH NEW SENSOR)   glucagon 1 MG injection Use for Severe Hypoglycemia, if unresponsive, unable to swallow, unconscious and/or has seizure   insulin aspart 100 UNIT/ML FlexPen Commonly known as: NovoLOG FlexPen Back up for pump malfunction   insulin glargine 100 UNIT/ML Solostar Pen Commonly known as: Lantus SoloStar Use up to 50 units daily as directed by MD   insulin lispro 100 UNIT/ML injection Commonly known as: HUMALOG Use 300 units in insulin pump every 48 hours   insulin pump Soln Inject 1 each into the skin See admin instructions. Use with Humalog.        Follow-up Information     [name], MD Follow up.   Specialty: Pediatrics Contact information: [address] [place] [address] [place], Spenser, NP. Schedule an appointment as soon as possible for a visit in 1 week(s).  Specialty: Family Medicine Contact information: [address] [place] [address] [phone]                Allergies  Allergen Reactions   Amoxicillin     Does not recall what reaction was Mother called MD to find allergy. Confirms amoxicillin    Consultations: None  Procedures/Studies: DG Chest 1 View  Result Date: 03/21/2021 CLINICAL DATA:  Chest pain since this morning EXAM: CHEST  1 VIEW COMPARISON:  Portable  exam 1735 hours without priors for comparison FINDINGS: Normal heart size, mediastinal contours, and pulmonary vascularity. Lungs clear. No pulmonary infiltrate, pleural effusion, or pneumothorax. Osseous structures unremarkable. IMPRESSION: No acute abnormalities. Electronically Signed   By: Lavonia Dana M.D.   On: 03/21/2021 18:32   DG Abd 1 View  Result Date: 03/21/2021 CLINICAL DATA:  Type 1 diabetes.  Nausea, vomiting and dizziness. EXAM: ABDOMEN - 1 VIEW COMPARISON:  08/11/2005 FINDINGS: Normal bowel gas pattern. No large abdominopelvic calcifications. No acute bone abnormality. IMPRESSION: Negative. Electronically Signed   By: Markus Daft M.D.   On: 03/21/2021 15:44      Subjective: Feels well, wants to go home.   Discharge Exam: Vitals:   03/22/21 1936 03/23/21 0651  BP: 138/67 131/74  Pulse: 73 66  Resp: 17 18  Temp: 97.7 F (36.5 C) 98.3 F (36.8 C)  SpO2:  98%   General: Pt is alert, awake, not in acute distress Cardiovascular: RRR, S1/S2 +, no rubs, no gallops Respiratory: CTA bilaterally, no wheezing, no rhonchi Abdominal: Soft, NT, ND, bowel sounds + Extremities: No edema, no cyanosis  Labs: BNP (last 3 results) No results for input(s): BNP in the last 8760 hours. Basic Metabolic Panel: Recent Labs  Lab 03/22/21 0020 03/22/21 0412 03/22/21 0808 03/22/21 1612 03/23/21 0245  NA 137 135 137 134* 138  K 4.4 4.3 3.9 3.6 3.4*  CL 106 106 105 102 107  CO2 15* 14* 20* 18* 22  GLUCOSE 193* 168* 170* 300* 111*  BUN '17 16 13 12 16  ' CREATININE 1.36* 1.23 1.22 1.27* 1.02  CALCIUM 9.2 8.9 9.1 8.7* 8.9   Liver Function Tests: No results for input(s): AST, ALT, ALKPHOS, BILITOT, PROT, ALBUMIN in the last 168 hours. Recent Labs  Lab 03/21/21 1600  LIPASE 18   No results for input(s): AMMONIA in the last 168 hours. CBC: Recent Labs  Lab 03/21/21 1135 03/21/21 1149  WBC 14.8*  --   HGB 15.9 17.7*  HCT 49.7 52.0  MCV 91.9  --   PLT 363  --    Cardiac  Enzymes: No results for input(s): CKTOTAL, CKMB, CKMBINDEX, TROPONINI in the last 168 hours. BNP: Invalid input(s): POCBNP CBG: Recent Labs  Lab 03/22/21 1553 03/22/21 1717 03/22/21 1855 03/22/21 2104 03/23/21 0733  GLUCAP 295* 249* 177* 156* 117*   D-Dimer No results for input(s): DDIMER in the last 72 hours. Hgb A1c Recent Labs    03/22/21 0020  HGBA1C 10.4*   Lipid Profile Recent Labs    03/22/21 0020  CHOL 183*  HDL 28*  LDLCALC 114*  TRIG 207*  CHOLHDL 6.5   Thyroid function studies No results for input(s): TSH, T4TOTAL, T3FREE, THYROIDAB in the last 72 hours.  Invalid input(s): FREET3 Anemia work up No results for input(s): VITAMINB12, FOLATE, FERRITIN, TIBC, IRON, RETICCTPCT in the last 72 hours. Urinalysis    Component Value Date/Time   COLORURINE STRAW (A) 03/21/2021 [place] 03/21/2021 1358  LABSPEC 1.021 03/21/2021 1358   PHURINE 5.0 03/21/2021 1358   GLUCOSEU >=500 (A) 03/21/2021 1358   HGBUR MODERATE (A) 03/21/2021 1358   BILIRUBINUR NEGATIVE 03/21/2021 1358   KETONESUR 80 (A) 03/21/2021 1358   PROTEINUR 30 (A) 03/21/2021 1358   UROBILINOGEN 0.2 09/04/2014 1240   NITRITE NEGATIVE 03/21/2021 [place] 03/21/2021 1358    Microbiology Recent Results (from the past 240 hour(s))  Resp Panel by RT-PCR (Flu A&B, Covid) Nasopharyngeal Swab     Status: None   Collection Time: 03/21/21 12:46 PM   Specimen: Nasopharyngeal Swab; Nasopharyngeal(NP) swabs in vial transport medium  Result Value Ref Range Status   SARS Coronavirus 2 by RT PCR NEGATIVE NEGATIVE Final    Comment: (NOTE) SARS-CoV-2 target nucleic acids are NOT DETECTED.  The SARS-CoV-2 RNA is generally detectable in upper respiratory specimens during the acute phase of infection. The lowest concentration of SARS-CoV-2 viral copies this assay can detect is 138 copies/mL. A negative result does not preclude SARS-Cov-2 infection and should not be used as  the sole basis for treatment or other patient management decisions. A negative result may occur with  improper specimen collection/handling, submission of specimen other than nasopharyngeal swab, presence of viral mutation(s) within the areas targeted by this assay, and inadequate number of viral copies(<138 copies/mL). A negative result must be combined with clinical observations, patient history, and epidemiological information. The expected result is Negative.  Fact Sheet for Patients:  EntrepreneurPulse.com.au  Fact Sheet for Healthcare Providers:  IncredibleEmployment.be  This test is no t yet approved or cleared by the [place] FDA and  has been authorized for detection and/or diagnosis of SARS-CoV-2 by FDA under an Emergency Use Authorization (EUA). This EUA will remain  in effect (meaning this test can be used) for the duration of the COVID-19 declaration under Section 564(b)(1) of the Act, 21 U.S.C.section 360bbb-3(b)(1), unless the authorization is terminated  or revoked sooner.       Influenza A by PCR NEGATIVE NEGATIVE Final   Influenza B by PCR NEGATIVE NEGATIVE Final    Comment: (NOTE) The Xpert Xpress SARS-CoV-2/FLU/RSV plus assay is intended as an aid in the diagnosis of influenza from Nasopharyngeal swab specimens and should not be used as a sole basis for treatment. Nasal washings and aspirates are unacceptable for Xpert Xpress SARS-CoV-2/FLU/RSV testing.  Fact Sheet for Patients: EntrepreneurPulse.com.au  Fact Sheet for Healthcare Providers: IncredibleEmployment.be  This test is not yet approved or cleared by the [place] FDA and has been authorized for detection and/or diagnosis of SARS-CoV-2 by FDA under an Emergency Use Authorization (EUA). This EUA will remain in effect (meaning this test can be used) for the duration of the COVID-19 declaration under Section 564(b)(1) of  the Act, 21 U.S.C. section 360bbb-3(b)(1), unless the authorization is terminated or revoked.  Performed at [facility], [place] [address] [place], [place] [address]     Time coordinating discharge: Approximately 40 minutes  Patrecia Pour, MD  [facility] 03/23/2021, 6:59 PM

## 2021-03-24 ENCOUNTER — Telehealth: Payer: Self-pay

## 2021-03-24 NOTE — Telephone Encounter (Signed)
Transition Care Management Unsuccessful Follow-up Telephone Call  Date of discharge and from where:  03/23/2021 [facility]   Attempts:  1st Attempt  Reason for unsuccessful TCM follow-up call:  Left voice message

## 2021-05-22 ENCOUNTER — Ambulatory Visit: Payer: Medicaid Other | Admitting: Pediatrics

## 2021-06-09 ENCOUNTER — Telehealth: Payer: Self-pay

## 2021-06-09 NOTE — Telephone Encounter (Signed)
Received lmn. Filled out and placed on providers desk for signature. Will fax once completed.

## 2021-06-12 ENCOUNTER — Encounter (INDEPENDENT_AMBULATORY_CARE_PROVIDER_SITE_OTHER): Payer: Self-pay | Admitting: Family

## 2021-06-12 ENCOUNTER — Other Ambulatory Visit: Payer: Self-pay

## 2021-06-12 ENCOUNTER — Ambulatory Visit (INDEPENDENT_AMBULATORY_CARE_PROVIDER_SITE_OTHER): Payer: Medicaid Other | Admitting: Family

## 2021-06-12 VITALS — BP 130/80 | HR 112 | Wt 182.0 lb

## 2021-06-12 DIAGNOSIS — Z91199 Patient's noncompliance with other medical treatment and regimen due to unspecified reason: Secondary | ICD-10-CM

## 2021-06-12 DIAGNOSIS — E1065 Type 1 diabetes mellitus with hyperglycemia: Secondary | ICD-10-CM

## 2021-06-12 DIAGNOSIS — Z9641 Presence of insulin pump (external) (internal): Secondary | ICD-10-CM

## 2021-06-12 LAB — POCT GLUCOSE (DEVICE FOR HOME USE): POC Glucose: 100 mg/dl — AB (ref 70–99)

## 2021-06-12 LAB — POCT GLYCOSYLATED HEMOGLOBIN (HGB A1C): Hemoglobin A1C: 9.7 % — AB (ref 4.0–5.6)

## 2021-06-12 NOTE — Progress Notes (Incomplete)
Medical Nutrition Therapy - Progress Note Appt start time: *** Appt end time: *** Reason for referral: Type 1 Diabetes with Hyperglycemia Referring provider: Hermenia Bers, NP - Endo Pertinent medical hx: Type 1 Diabetes (dx age: 21), AKI, noncompliance  Assessment: Food allergies: *** Pertinent Medications: see medication list - insulin Vitamins/Supplements: *** Pertinent labs:  (1/30) POCT Glucose: 100 (high) (1/30) POCT Hgb A1c: 9.7 (high)  (2/3) Anthropometrics: The child was weighed, measured, and plotted on the CDC growth chart. Ht: *** cm (*** %)  Z-score: *** Wt: *** kg (*** %)  Z-score: *** BMI: *** (*** %)  Z-score: ***   IBW based on *** @ ***th%: *** kg  Wt Readings from Last 3 Encounters:  06/12/21 182 lb (82.6 kg) (84 %, Z= 1.00)*  03/21/21 167 lb 1.6 oz (75.8 kg) (71 %, Z= 0.57)*  02/07/21 182 lb 6.4 oz (82.7 kg) (85 %, Z= 1.05)*   * Growth percentiles are based on CDC (Boys, 2-20 Years) data.    Ht Readings from Last 3 Encounters:  03/21/21 5\' 5"  (1.651 m) (6 %, Z= -1.59)*  02/07/21 5' 5.35" (1.66 m) (7 %, Z= -1.46)*  08/02/20 5' 5.35" (1.66 m) (8 %, Z= -1.43)*   * Growth percentiles are based on CDC (Boys, 2-20 Years) data.     Estimated minimum caloric needs: 36 kcal/kg/day (DRI) Estimated minimum protein needs: 0.8 g/kg/day (DRI) Estimated minimum fluid needs: *** mL/kg/day (Holliday Segar)  Primary concerns today: Consult for reviewing carb counting education in setting of type 1 diabetes. *** accompanied pt to appt today.  Dietary Intake Hx: Usual eating pattern includes: *** meals and *** snacks per day.  Meal location: ***  Meal duration: ***  Feeding skills: ***  Everyone served same meals: ***  Family meals: ***  Fast-food/eating out: ***  School breakfast/lunch: ***  Food Insecurity: ***  Methods of CHO counting used (Calorie [employer], MyFitnessPal, manufacturers website, food scale): *** What do you feel is your biggest struggle with  CHO counting: ***  Preferred foods: *** Avoided foods: ***  24-hr recall: Breakfast: *** Snack: *** Lunch: *** Snack: *** Dinner: *** Snack: ***  Typical Snacks: *** Typical Beverages: ***  Physical Activity: ***  GI: *** GU: ***  Estimated intake *** meeting needs given *** growth.  Pt consuming various food groups: ***  Pt consuming adequate amounts of each food group: ***   Nutrition Diagnosis: (***) Food and nutrition related knowledge deficient related to difficulties counting carbohydrates as evidence by *** report.   Intervention: *** Discussed importance of nutrient dense carbohydrates and consistent meals and snacks. Discussed all food groups, sources of each and their importance; carbohydrates vs noncarbohydrates and benefit of pairing to aid in blood glucose stabilization.  Reviewed carbohydrate counting and importance of accuracy to prevent hypo/hyperglycemia. Discussed recommendations below. All questions answered, family in agreement with plan.   Nutrition Recommendations: - *** - Continue using your resources for carb counting:  Read the nutrition labels Use measuring cups Technology - [place], My Fitness Pal, Fooducate, manufacturer websites Handouts provided today - Have consistent meals and snacks daily eating a variety of foods from each food group (whole grains, vegetables, fruits, low-fat or fat-free dairy, lean proteins).  Keep up the good work and keep practicing! Follow-up with me as you would like.  Handouts Given: - GG Diabetes Exchange List - Snack Ideas for Kids with Diabetes - Diabetes Foods Plate  - Carbohydrates vs Noncarbohydrates - Hand Portion Sizes  Teach back  method used.  Monitoring/Evaluation: Continue to Monitor: - Growth trends - Lab values  Follow-up in ***.  Total time spent in counseling: *** minutes.

## 2021-06-12 NOTE — Progress Notes (Deleted)
Pediatric Endocrinology Diabetes Consultation Follow-up Visit  Ricky Shaw 11/26/2000 [id number]  Chief Complaint: Follow-up type 1 diabetes   Ricky Benders, MD   HPI: Ricky Shaw  is a 21 y.o. male presenting for follow-up of type 1 diabetes. he is accompanied to this visit by his grandmother (stayed in lobby) .  1. Ricky Shaw was admitted to the pediatric ward at [facility] on 11/13/11 for the above chief complaint. He had had polyuria and polydipsia. His initial BG was 340. Initial serum glucose was 354. Serum CO2 was 18. Venous pH was 7.367.  Hemoglobin A1c was 13.3%. Serum C-peptide was 0.68 (normal 0.80-390). Urine glucose was >1000 and urine ketones were >80. Anti-islet cell antibody was 40 (normal <5). Insulin antibodies were 5.0 (normal <0.4).Anti-GAD antibody was negative at <1.0. TSH was 2.344, free T4 1.36, free T3 3.5.  Tissue transglutaminase IgA was 2.2 (normal <20). Gliadin IgA antibodies were 4.9 (normal <20). He was started on Lantus as a basal insulin and Novolog as a bolus insulin at mealtimes, and also at bedtime and 2 AM if needed. After receiving IV fluids, insulins, and DM education, he was discharged on 11/17/11.   2. Since last visit to [facility] on 01/2021, he has been well. He was admitted to [facility] on 03/2021 with DKA likely due to insulin pump site failure.   He has been spending most of his free time helping his Grandmother and filling out job applications. He has not gotten much activity lately.   Reports that things are getting better with his diabetes care. He feels like he is bolusing more consistently and when he does forgets he gives correction bolus later. His Tslim is working well and he is consistently wearing Dexcom. Rarely having hypoglycemia.     Insulin regimen: Tsim insulin pump  - Basal Rates 12AM 1.85  3am 1.95  6am 2.0   8am 2.20   9pm  2.20    50.6 units per day   Insulin to Carbohydrate Ratio 12AM 10  6am 4  11am 3   4pm 4         Insulin Sensitivity Factor 12AM 24  3am 24  6am 24  7am 20  8am 9pm 11pm '20 20 24   ' Target Blood Glucose 12AM 150  6am 110  9pm 150          Hypoglycemia: Is not consistently able to feel low blood sugars.practice. No glucagon needed.  Insulin pump download:   Med-alert ID: Bracelet  Injection sites: legs, arms  Annual labs due: 12/2020 Ophthalmology due: 2021. Discussed he is overdue. Needs to have exam.     3. ROS: Greater than 10 systems reviewed with pertinent positives listed in HPI, otherwise neg. Constitutional: Sleeping well. Weight stable.  Eyes: No changes in vision. No blurry vision.  Ears/Nose/Mouth/Throat: No difficulty swallowing. Denies neck pain  Cardiovascular: No palpitations. Denies chest pain  Respiratory: No increased work of breathing. Denies SOB   Neurologic: Normal sensation, no tremor GI: No abdominal pain. No constipation/diarrhea.  Endocrine: No polydipsia.  No hyperpigmentation Psychiatric: Normal affect today. Denies depression and anxiety   Past Medical History:   Past Medical History:  Diagnosis Date   Diabetes mellitus 11/14/2011   Diabetes mellitus without complication ([place])    Phreesia 03/03/2020    Medications:  Outpatient Encounter Medications as of 06/12/2021  Medication Sig Note   Accu-Chek Softclix Lancets lancets Use as instructed    Blood Glucose Monitoring Suppl (ACCU-CHEK GUIDE ME) w/Device KIT 1 kit by  Does not apply route as directed.    Continuous Blood Gluc Receiver (DEXCOM G6 RECEIVER) DEVI 1 Device by Does not apply route as directed.    Continuous Blood Gluc Sensor (DEXCOM G6 SENSOR) MISC INJECT 1 APPLICATOR INTO THE SKIN AS DIRECTED(CHANGE SENSOR EVERY 10 DAYS)    Continuous Blood Gluc Transmit (DEXCOM G6 TRANSMITTER) MISC INJECT 1 DEVICE INTO THE SKIN AS DIRECTED( RE-USE UP TP 8X WITH EACH NEW SENSOR)    glucagon 1 MG injection Use for Severe Hypoglycemia, if unresponsive, unable to swallow, unconscious  and/or has seizure 04/20/2020: .   glucose blood (ACCU-CHEK GUIDE) test strip Use as instructed    insulin aspart (NOVOLOG FLEXPEN) 100 UNIT/ML FlexPen Back up for pump malfunction (Patient not taking: Reported on 03/21/2021)    Insulin Glargine (LANTUS SOLOSTAR) 100 UNIT/ML Solostar Pen Use up to 50 units daily as directed by MD (Patient not taking: Reported on 03/21/2021) 08/04/2019: For back up   Insulin Human (INSULIN PUMP) SOLN Inject 1 each into the skin See admin instructions. Use with Humalog.    insulin lispro (HUMALOG) 100 UNIT/ML injection Use 300 units in insulin pump every 48 hours    No facility-administered encounter medications on file as of 06/12/2021.    Allergies: Allergies  Allergen Reactions   Amoxicillin     Does not recall what reaction was Mother called MD to find allergy. Confirms amoxicillin    Surgical History: Past Surgical History:  Procedure Laterality Date   nasal cauterization     NASAL HEMORRHAGE CONTROL  05/14/2006    Family History:  Family History  Problem Relation Age of Onset   Asthma Maternal Aunt    Cancer Maternal Grandfather    Diabetes Paternal Grandmother    Hypertension Other    Thyroid disease Neg Hx       Social History: Lives with: mother  Graduated. Unemployed currently   Physical Exam:  There were no vitals filed for this visit.    There were no vitals taken for this visit. Body mass index: body mass index is unknown because there is no height or weight on file. Blood pressure percentiles are not available for patients who are 18 years or older.  Ht Readings from Last 3 Encounters:  03/21/21 '5\' 5"'  (1.651 m) (6 %, Z= -1.59)*  02/07/21 5' 5.35" (1.66 m) (7 %, Z= -1.46)*  08/02/20 5' 5.35" (1.66 m) (8 %, Z= -1.43)*   * Growth percentiles are based on CDC (Boys, 2-20 Years) data.   Wt Readings from Last 3 Encounters:  03/21/21 167 lb 1.6 oz (75.8 kg) (71 %, Z= 0.57)*  02/07/21 182 lb 6.4 oz (82.7 kg) (85 %, Z= 1.05)*   01/10/21 176 lb 3.2 oz (79.9 kg) (81 %, Z= 0.88)*   * Growth percentiles are based on CDC (Boys, 2-20 Years) data.    PHYSICAL EXAM: General: Well developed, well nourished male in no acute distress.   Head: Normocephalic, atraumatic.   Eyes:  Pupils equal and round. EOMI.  Sclera white.  No eye drainage.   Ears/Nose/Mouth/Throat: Nares patent, no nasal drainage.  Normal dentition, mucous membranes moist.  Neck: supple, no cervical lymphadenopathy, no thyromegaly Cardiovascular: regular rate, normal S1/S2, no murmurs Respiratory: No increased work of breathing.  Lungs clear to auscultation bilaterally.  No wheezes. Abdomen: soft, nontender, nondistended. Normal bowel sounds.  No appreciable masses  Extremities: warm, well perfused, cap refill < 2 sec.   Musculoskeletal: Normal muscle mass.  Normal strength Skin: warm, dry.  No rash or lesions. Neurologic: alert and oriented, normal speech, no tremor   Labs:  Results for orders placed or performed during the hospital encounter of 03/21/21  Resp Panel by RT-PCR (Flu A&B, Covid) Nasopharyngeal Swab   Specimen: Nasopharyngeal Swab; Nasopharyngeal(NP) swabs in vial transport medium  Result Value Ref Range   SARS Coronavirus 2 by RT PCR NEGATIVE NEGATIVE   Influenza A by PCR NEGATIVE NEGATIVE   Influenza B by PCR NEGATIVE NEGATIVE  Basic metabolic panel  Result Value Ref Range   Sodium 132 (L) 135 - 145 mmol/L   Potassium 5.6 (H) 3.5 - 5.1 mmol/L   Chloride 97 (L) 98 - 111 mmol/L   CO2 <7 (L) 22 - 32 mmol/L   Glucose, Bld 554 (HH) 70 - 99 mg/dL   BUN 22 (H) 6 - 20 mg/dL   Creatinine, Ser 1.59 (H) 0.61 - 1.24 mg/dL   Calcium 10.1 8.9 - 10.3 mg/dL   GFR, Estimated >60 >60 mL/min   Anion gap NOT CALCULATED 5 - 15  CBC  Result Value Ref Range   WBC 14.8 (H) 4.0 - 10.5 K/uL   RBC 5.41 4.22 - 5.81 MIL/uL   Hemoglobin 15.9 13.0 - 17.0 g/dL   HCT 49.7 39.0 - 52.0 %   MCV 91.9 80.0 - 100.0 fL   MCH 29.4 26.0 - 34.0 pg   MCHC 32.0  30.0 - 36.0 g/dL   RDW 13.9 11.5 - 15.5 %   Platelets 363 150 - 400 K/uL   nRBC 0.0 0.0 - 0.2 %  Urinalysis, Routine w reflex microscopic Urine, Clean Catch  Result Value Ref Range   Color, Urine STRAW (A) YELLOW   APPearance CLEAR CLEAR   Specific Gravity, Urine 1.021 1.005 - 1.030   pH 5.0 5.0 - 8.0   Glucose, UA >=500 (A) NEGATIVE mg/dL   Hgb urine dipstick MODERATE (A) NEGATIVE   Bilirubin Urine NEGATIVE NEGATIVE   Ketones, ur 80 (A) NEGATIVE mg/dL   Protein, ur 30 (A) NEGATIVE mg/dL   Nitrite NEGATIVE NEGATIVE   Leukocytes,Ua NEGATIVE NEGATIVE   RBC / HPF 0-5 0 - 5 RBC/hpf   WBC, UA 0-5 0 - 5 WBC/hpf   Bacteria, UA NONE SEEN NONE SEEN   Squamous Epithelial / LPF 0-5 0 - 5  Beta-hydroxybutyric acid  Result Value Ref Range   Beta-Hydroxybutyric Acid >8.00 (H) 0.05 - 0.27 mmol/L  Basic metabolic panel  Result Value Ref Range   Sodium 137 135 - 145 mmol/L   Potassium 5.5 (H) 3.5 - 5.1 mmol/L   Chloride 106 98 - 111 mmol/L   CO2 9 (L) 22 - 32 mmol/L   Glucose, Bld 217 (H) 70 - 99 mg/dL   BUN 19 6 - 20 mg/dL   Creatinine, Ser 1.55 (H) 0.61 - 1.24 mg/dL   Calcium 9.5 8.9 - 10.3 mg/dL   GFR, Estimated >60 >60 mL/min   Anion gap 22 (H) 5 - 15  Basic metabolic panel  Result Value Ref Range   Sodium 137 135 - 145 mmol/L   Potassium 4.4 3.5 - 5.1 mmol/L   Chloride 106 98 - 111 mmol/L   CO2 15 (L) 22 - 32 mmol/L   Glucose, Bld 193 (H) 70 - 99 mg/dL   BUN 17 6 - 20 mg/dL   Creatinine, Ser 1.36 (H) 0.61 - 1.24 mg/dL   Calcium 9.2 8.9 - 10.3 mg/dL   GFR, Estimated >60 >60 mL/min   Anion gap 16 (H) 5 -  15  Lipase, blood  Result Value Ref Range   Lipase 18 11 - 51 U/L  Basic metabolic panel  Result Value Ref Range   Sodium 135 135 - 145 mmol/L   Potassium 4.3 3.5 - 5.1 mmol/L   Chloride 106 98 - 111 mmol/L   CO2 14 (L) 22 - 32 mmol/L   Glucose, Bld 168 (H) 70 - 99 mg/dL   BUN 16 6 - 20 mg/dL   Creatinine, Ser 1.23 0.61 - 1.24 mg/dL   Calcium 8.9 8.9 - 10.3 mg/dL   GFR,  Estimated >60 >60 mL/min   Anion gap 15 5 - 15  Urine rapid drug screen (hosp performed)  Result Value Ref Range   Opiates NONE DETECTED NONE DETECTED   Cocaine NONE DETECTED NONE DETECTED   Benzodiazepines NONE DETECTED NONE DETECTED   Amphetamines NONE DETECTED NONE DETECTED   Tetrahydrocannabinol POSITIVE (A) NONE DETECTED   Barbiturates NONE DETECTED NONE DETECTED  Basic metabolic panel  Result Value Ref Range   Sodium 137 135 - 145 mmol/L   Potassium 3.9 3.5 - 5.1 mmol/L   Chloride 105 98 - 111 mmol/L   CO2 20 (L) 22 - 32 mmol/L   Glucose, Bld 170 (H) 70 - 99 mg/dL   BUN 13 6 - 20 mg/dL   Creatinine, Ser 1.22 0.61 - 1.24 mg/dL   Calcium 9.1 8.9 - 10.3 mg/dL   GFR, Estimated >60 >60 mL/min   Anion gap 12 5 - 15  Glucose, capillary  Result Value Ref Range   Glucose-Capillary 191 (H) 70 - 99 mg/dL  Glucose, capillary  Result Value Ref Range   Glucose-Capillary 204 (H) 70 - 99 mg/dL  Glucose, capillary  Result Value Ref Range   Glucose-Capillary 193 (H) 70 - 99 mg/dL  Hemoglobin A1c  Result Value Ref Range   Hgb A1c MFr Bld 10.4 (H) 4.8 - 5.6 %   Mean Plasma Glucose 251.78 mg/dL  Glucose, capillary  Result Value Ref Range   Glucose-Capillary 186 (H) 70 - 99 mg/dL  Lipid panel  Result Value Ref Range   Cholesterol 183 (H) 0 - 169 mg/dL   Triglycerides 207 (H) <150 mg/dL   HDL 28 (L) >40 mg/dL   Total CHOL/HDL Ratio 6.5 RATIO   VLDL 41 (H) 0 - 40 mg/dL   LDL Cholesterol 114 (H) 0 - 99 mg/dL  Glucose, capillary  Result Value Ref Range   Glucose-Capillary 168 (H) 70 - 99 mg/dL  Glucose, capillary  Result Value Ref Range   Glucose-Capillary 187 (H) 70 - 99 mg/dL  Glucose, capillary  Result Value Ref Range   Glucose-Capillary 185 (H) 70 - 99 mg/dL  Glucose, capillary  Result Value Ref Range   Glucose-Capillary 172 (H) 70 - 99 mg/dL  Glucose, capillary  Result Value Ref Range   Glucose-Capillary 174 (H) 70 - 99 mg/dL  Glucose, capillary  Result Value Ref Range    Glucose-Capillary 173 (H) 70 - 99 mg/dL  Glucose, capillary  Result Value Ref Range   Glucose-Capillary 169 (H) 70 - 99 mg/dL  Glucose, capillary  Result Value Ref Range   Glucose-Capillary 166 (H) 70 - 99 mg/dL  Glucose, capillary  Result Value Ref Range   Glucose-Capillary 166 (H) 70 - 99 mg/dL  Basic metabolic panel  Result Value Ref Range   Sodium 134 (L) 135 - 145 mmol/L   Potassium 3.6 3.5 - 5.1 mmol/L   Chloride 102 98 - 111 mmol/L   CO2 18 (  L) 22 - 32 mmol/L   Glucose, Bld 300 (H) 70 - 99 mg/dL   BUN 12 6 - 20 mg/dL   Creatinine, Ser 1.27 (H) 0.61 - 1.24 mg/dL   Calcium 8.7 (L) 8.9 - 10.3 mg/dL   GFR, Estimated >60 >60 mL/min   Anion gap 14 5 - 15  Glucose, capillary  Result Value Ref Range   Glucose-Capillary 297 (H) 70 - 99 mg/dL  Glucose, capillary  Result Value Ref Range   Glucose-Capillary 297 (H) 70 - 99 mg/dL  Glucose, capillary  Result Value Ref Range   Glucose-Capillary 147 (H) 70 - 99 mg/dL  Glucose, capillary  Result Value Ref Range   Glucose-Capillary 133 (H) 70 - 99 mg/dL  Glucose, capillary  Result Value Ref Range   Glucose-Capillary 187 (H) 70 - 99 mg/dL  Glucose, capillary  Result Value Ref Range   Glucose-Capillary 295 (H) 70 - 99 mg/dL  Basic metabolic panel  Result Value Ref Range   Sodium 138 135 - 145 mmol/L   Potassium 3.4 (L) 3.5 - 5.1 mmol/L   Chloride 107 98 - 111 mmol/L   CO2 22 22 - 32 mmol/L   Glucose, Bld 111 (H) 70 - 99 mg/dL   BUN 16 6 - 20 mg/dL   Creatinine, Ser 1.02 0.61 - 1.24 mg/dL   Calcium 8.9 8.9 - 10.3 mg/dL   GFR, Estimated >60 >60 mL/min   Anion gap 9 5 - 15  Glucose, capillary  Result Value Ref Range   Glucose-Capillary 249 (H) 70 - 99 mg/dL  Glucose, capillary  Result Value Ref Range   Glucose-Capillary 177 (H) 70 - 99 mg/dL  Glucose, capillary  Result Value Ref Range   Glucose-Capillary 156 (H) 70 - 99 mg/dL  Glucose, capillary  Result Value Ref Range   Glucose-Capillary 117 (H) 70 - 99 mg/dL  CBG  monitoring, ED  Result Value Ref Range   Glucose-Capillary 542 (HH) 70 - 99 mg/dL   Comment 1 Notify RN   CBG monitoring, ED  Result Value Ref Range   Glucose-Capillary 541 (HH) 70 - 99 mg/dL   Comment 1 Notify RN    Comment 2 Document in Chart   I-Stat venous blood gas, [facility] ED)  Result Value Ref Range   pH, Ven 7.212 (L) 7.250 - 7.430   pCO2, Ven 21.4 (L) 44.0 - 60.0 mmHg   pO2, Ven 141.0 (H) 32.0 - 45.0 mmHg   Bicarbonate 8.6 (L) 20.0 - 28.0 mmol/L   TCO2 9 (L) 22 - 32 mmol/L   O2 Saturation 99.0 %   Acid-base deficit 17.0 (H) 0.0 - 2.0 mmol/L   Sodium 132 (L) 135 - 145 mmol/L   Potassium 5.3 (H) 3.5 - 5.1 mmol/L   Calcium, Ion 1.14 (L) 1.15 - 1.40 mmol/L   HCT 52.0 39.0 - 52.0 %   Hemoglobin 17.7 (H) 13.0 - 17.0 g/dL   Sample type VENOUS   CBG monitoring, ED  Result Value Ref Range   Glucose-Capillary 528 (HH) 70 - 99 mg/dL   Comment 1 Notify RN   CBG monitoring, ED  Result Value Ref Range   Glucose-Capillary 420 (H) 70 - 99 mg/dL  CBG monitoring, ED  Result Value Ref Range   Glucose-Capillary 441 (H) 70 - 99 mg/dL  CBG monitoring, ED  Result Value Ref Range   Glucose-Capillary 319 (H) 70 - 99 mg/dL  CBG monitoring, ED  Result Value Ref Range   Glucose-Capillary 236 (H) 70 - 99  mg/dL   Comment 1 Notify RN    Comment 2 Document in Chart   CBG monitoring, ED  Result Value Ref Range   Glucose-Capillary 210 (H) 70 - 99 mg/dL  CBG monitoring, ED  Result Value Ref Range   Glucose-Capillary 212 (H) 70 - 99 mg/dL    Assessment/Plan: Sohil is a 21 y.o. male with uncontrolled type 1 diabetes using Tandem Tslim insulin pump. He has done better with diabetes control over the past month. He is bolusing more consistently which has decreased time in hyperglycemia. TIR has improved to 43% but is less then goal of >70%. He needs more basal insulin throughout the day.     1-4. DM w/o complication type I, uncontrolled (HCC)/Hyperglycemia/hypoglycemia unawareness/elevated  a1c  - Reviewed insulin pump and CGM download. Discussed trends and patterns.  - Rotate pump sites to prevent scar tissue.  - bolus 15 minutes prior to eating to limit blood sugar spikes.  - Reviewed carb counting and importance of accurate carb counting.  - Discussed signs and symptoms of hypoglycemia. Always have glucose available.  - POCT glucose and hemoglobin A1c  - Reviewed growth chart.    4. Adjustment reaction/noncompliance  - Discussed barriers to care and motivation.  - Praise given for improvements.   5. Insulin pump Titration /Insulin pump in place.  - Basal Rates 12AM 1.75--> 1.85  3am 1.85--> 1.95  6am 1.90--> 2.0   8am 2.1--> 2.20   9pm  2.1 --> 2.20    50.6 units per day   Insulin to Carbohydrate Ratio 12AM 10  6am 4  11am 4--> 3   4pm 4      Follow-up:  6 weeks.   LOS:>30  spent today reviewing the medical chart, counseling the patient/family, and documenting today's visit.   When a patient is on insulin, intensive monitoring of blood glucose levels is necessary to avoid hyperglycemia and hypoglycemia. Severe hyperglycemia/hypoglycemia can lead to hospital admissions and be life threatening.     Hermenia Bers,  FNP-C  Pediatric Specialist  [address] [place]  [place], [address]  Tele: [phone]

## 2021-06-12 NOTE — Patient Instructions (Signed)
It was a pleasure seeing you in clinic today. Please do not hesitate to contact me if you have questions or concerns.   Please sign up for MyChart. This is a communication tool that allows you to send an email directly to me. This can be used for questions, prescriptions and blood sugar reports. We will also release labs to you with instructions on MyChart. Please do not use MyChart if you need immediate or emergency assistance. Ask our wonderful front office staff if you need assistance.   - Bolus at breakfast and dinner!

## 2021-06-12 NOTE — Progress Notes (Signed)
Pediatric Endocrinology Diabetes Consultation Follow-up Visit  Ricky Shaw [birth year] [id number]  Chief Complaint: Follow-up type 1 diabetes   [name], MD   HPI: Ricky Shaw  is a 21 y.o. male presenting for follow-up of type 1 diabetes. he is accompanied to this visit by his grandmother (stayed in lobby) .  1. Ricky Shaw was admitted to the pediatric ward at [facility] on 11/13/11 for the above chief complaint. He had had polyuria and polydipsia. His initial BG was 340. Initial serum glucose was 354. Serum CO2 was 18. Venous pH was 7.367.  Hemoglobin A1c was 13.3%. Serum C-peptide was 0.68 (normal 0.80-390). Urine glucose was >1000 and urine ketones were >80. Anti-islet cell antibody was 40 (normal <5). Insulin antibodies were 5.0 (normal <0.4).Anti-GAD antibody was negative at <1.0. TSH was 2.344, free T4 1.36, free T3 3.5.  Tissue transglutaminase IgA was 2.2 (normal <20). Gliadin IgA antibodies were 4.9 (normal <20). He was started on Lantus as a basal insulin and Novolog as a bolus insulin at mealtimes, and also at bedtime and 2 AM if needed. After receiving IV fluids, insulins, and DM education, he was discharged on 11/17/11.   2. Since last visit to [facility] on 01/2021, he has been well. He was admitted to [facility] on 03/2021 with DKA likely due to insulin pump site failure. He reports that he changed his site prior to going to sleep, when he woke up the next morning his blood sugar was high so he took a correction and drank water but went back to sleep and felt nauseous with vomiting.   He is using Tslim insulin pump and Dexcom CGM. He has not had many failed pump sites other then the one that resulted in DKA. He has been trying harder to remember to bolus when he eats, usually boluses after eating. Rotating pump sites between abdomen and legs. Hypoglycemia occurs "randomly" but does not feel like it is often. He estimates his carbs and often undercounts.   He is currently living in a hotel  while his house is getting renovated. He is not currently working. He walks on treadmill or lifts weights a few times per week for exercise.   Insulin regimen: Tsim insulin pump  - Basal Rates 12AM 1.85  3am 1.95  6am 2.0   8am 2.20   9pm  2.20    50.6 units per day   Insulin to Carbohydrate Ratio 12AM 10  6am 4  11am 3   4pm 4        Insulin Sensitivity Factor 12AM 24  3am 24  6am 24  7am 20  8am 9pm 11pm _0 Target Blood Glucose 12AM 150  6am 110  9pm 150          Hypoglycemia: Is not consistently able to feel low blood sugars.practice. No glucagon needed.  Insulin pump download:  - Insulin pump download shows he is bolusing 1 time per day on average. Many days with no boluses. He also appears to be undercounting carbs, entering 15-35 grams.   Med-alert ID: Bracelet  Injection sites: legs, abdomen  Annual labs due: Next visit  Ophthalmology due: 2021. Discussed he is overdue. Needs to have exam.     3. ROS: Greater than 10 systems reviewed with pertinent positives listed in HPI, otherwise neg. Constitutional: Sleeping well. Weight stable.  Eyes: No changes in vision. No blurry vision.  Ears/Nose/Mouth/Throat: No difficulty swallowing. Denies neck pain  Cardiovascular: No palpitations. Denies chest pain  Respiratory:  No increased work of breathing. Denies SOB   Neurologic: Normal sensation, no tremor GI: No abdominal pain. No constipation/diarrhea.  Endocrine: No polydipsia.  No hyperpigmentation Psychiatric: Normal affect today. Denies depression and anxiety   Past Medical History:   Past Medical History:  Diagnosis Date   Diabetes mellitus 11/14/2011   Diabetes mellitus without complication ([place])    Phreesia 03/03/2020    Medications:  Outpatient Encounter Medications as of 06/12/2021  Medication Sig Note   Accu-Chek Softclix Lancets lancets Use as instructed    Blood Glucose Monitoring Suppl (ACCU-CHEK GUIDE ME) w/Device KIT 1 kit  by Does not apply route as directed.    Continuous Blood Gluc Receiver (DEXCOM G6 RECEIVER) DEVI 1 Device by Does not apply route as directed.    Continuous Blood Gluc Sensor (DEXCOM G6 SENSOR) MISC INJECT 1 APPLICATOR INTO THE SKIN AS DIRECTED(CHANGE SENSOR EVERY 10 DAYS)    Continuous Blood Gluc Transmit (DEXCOM G6 TRANSMITTER) MISC INJECT 1 DEVICE INTO THE SKIN AS DIRECTED( RE-USE UP TP 8X WITH EACH NEW SENSOR)    glucagon 1 MG injection Use for Severe Hypoglycemia, if unresponsive, unable to swallow, unconscious and/or has seizure 04/20/2020: .   glucose blood (ACCU-CHEK GUIDE) test strip Use as instructed    Insulin Human (INSULIN PUMP) SOLN Inject 1 each into the skin See admin instructions. Use with Humalog.    insulin lispro (HUMALOG) 100 UNIT/ML injection Use 300 units in insulin pump every 48 hours    insulin aspart (NOVOLOG FLEXPEN) 100 UNIT/ML FlexPen Back up for pump malfunction (Patient not taking: Reported on 03/21/2021)    Insulin Glargine (LANTUS SOLOSTAR) 100 UNIT/ML Solostar Pen Use up to 50 units daily as directed by MD (Patient not taking: Reported on 03/21/2021) 08/04/2019: For back up   No facility-administered encounter medications on file as of 06/12/2021.    Allergies: Allergies  Allergen Reactions   Amoxicillin     Does not recall what reaction was Mother called MD to find allergy. Confirms amoxicillin    Surgical History: Past Surgical History:  Procedure Laterality Date   nasal cauterization     NASAL HEMORRHAGE CONTROL  05/14/2006    Family History:  Family History  Problem Relation Age of Onset   Asthma Maternal Aunt    Cancer Maternal Grandfather    Diabetes Paternal Grandmother    Hypertension Other    Thyroid disease Neg Hx       Social History: Lives with: mother  Graduated. Unemployed currently   Physical Exam:  Vitals:   06/12/21 1000  BP: 130/80  Pulse: (!) 112  Weight: 182 lb (82.6 kg)      BP 130/80    Pulse (!) 112    Wt 182 lb  (82.6 kg)    BMI 30.29 kg/m  Body mass index: body mass index is 30.29 kg/m. Blood pressure percentiles are not available for patients who are 18 years or older.  Ht Readings from Last 3 Encounters:  03/21/21 5' 5" (1.651 m) (6 %, Z= -1.59)*  02/07/21 5' 5.35" (1.66 m) (7 %, Z= -1.46)*  08/02/20 5' 5.35" (1.66 m) (8 %, Z= -1.43)*   * Growth percentiles are based on CDC (Boys, 2-20 Years) data.   Wt Readings from Last 3 Encounters:  06/12/21 182 lb (82.6 kg) (84 %, Z= 1.00)*  03/21/21 167 lb 1.6 oz (75.8 kg) (71 %, Z= 0.57)*  02/07/21 182 lb 6.4 oz (82.7 kg) (85 %, Z= 1.05)*   * Growth percentiles are  based on CDC (Boys, 2-20 Years) data.    PHYSICAL EXAM: General: Well developed, well nourished male in no acute distress.   Head: Normocephalic, atraumatic.   Eyes:  Pupils equal and round. EOMI.  Sclera white.  No eye drainage.   Ears/Nose/Mouth/Throat: Nares patent, no nasal drainage.  Normal dentition, mucous membranes moist.  Neck: supple, no cervical lymphadenopathy, no thyromegaly Cardiovascular: regular rate, normal S1/S2, no murmurs Respiratory: No increased work of breathing.  Lungs clear to auscultation bilaterally.  No wheezes. Abdomen: soft, nontender, nondistended. Normal bowel sounds.  No appreciable masses  Extremities: warm, well perfused, cap refill < 2 sec.   Musculoskeletal: Normal muscle mass.  Normal strength Skin: warm, dry.  No rash or lesions. Neurologic: alert and oriented, normal speech, no tremor   Labs:  Results for orders placed or performed in visit on 06/12/21  POCT Glucose (Device for Home Use)  Result Value Ref Range   Glucose Fasting, POC     POC Glucose 100 (A) 70 - 99 mg/dl  POCT glycosylated hemoglobin (Hb A1C)  Result Value Ref Range   Hemoglobin A1C 9.7 (A) 4.0 - 5.6 %   HbA1c POC (<> result, manual entry)     HbA1c, POC (prediabetic range)     HbA1c, POC (controlled diabetic range)      Assessment/Plan: Ricky Shaw is a 21 y.o.  male with uncontrolled type 1 diabetes using Tandem Tslim insulin pump. He has made improvements to diabetes care since admission for DKA. However, he is frequently skipping bolus insulin at breakfast and dinner which is leading to hyperglycemia. His hemoglobin A 1c has improved to 9.7% but is higher then ADA goal of <7%.   1-4. DM w/o complication type I, uncontrolled (HCC)/Hyperglycemia/hypoglycemia unawareness/elevated a1c  - Reviewed insulin pump and CGM download. Discussed trends and patterns.  - Rotate pump sites to prevent scar tissue.  - bolus 15 minutes prior to eating to limit blood sugar spikes.  - Reviewed carb counting and importance of accurate carb counting.  - Discussed signs and symptoms of hypoglycemia. Always have glucose available.  - POCT glucose and hemoglobin A1c  - Reviewed growth chart.  - Discussed management if pump site fails and protocols to follow.  - Refer to [place] for carb counting education.   4. Adjustment reaction/noncompliance  - Discussed barriers to care and possible complications of uncontrolled type 1 diabetes.   5. Insulin pump Titration /Insulin pump in place.  No changes today. Work on [employer].   Follow-up:  4 weeks.   LOS:>45 spent today reviewing the medical chart, counseling the patient/family, and documenting today's visit.    When a patient is on insulin, intensive monitoring of blood glucose levels is necessary to avoid hyperglycemia and hypoglycemia. Severe hyperglycemia/hypoglycemia can lead to hospital admissions and be life threatening.     Hermenia Bers,  FNP-C  Pediatric Specialist  [address] [place]  [place], [address]  Tele: [phone]

## 2021-06-16 ENCOUNTER — Ambulatory Visit (INDEPENDENT_AMBULATORY_CARE_PROVIDER_SITE_OTHER): Payer: Medicaid Other | Admitting: Dietician

## 2021-06-20 ENCOUNTER — Encounter (INDEPENDENT_AMBULATORY_CARE_PROVIDER_SITE_OTHER): Payer: Self-pay | Admitting: Dietician

## 2021-07-13 ENCOUNTER — Ambulatory Visit (INDEPENDENT_AMBULATORY_CARE_PROVIDER_SITE_OTHER): Payer: Medicaid Other | Admitting: Family

## 2021-07-13 NOTE — Progress Notes (Unsigned)
Pediatric Endocrinology Diabetes Consultation Follow-up Visit  Ricky Shaw 21-Jun-2000 [id number]  Chief Complaint: Follow-up type 1 diabetes   [name], MD   HPI: Ricky Shaw  is a 21 y.o. male presenting for follow-up of type 1 diabetes. he is accompanied to this visit by his grandmother (stayed in lobby) .  1. Ricky Shaw was admitted to the pediatric ward at [facility] on 11/13/11 for the above chief complaint. He had had polyuria and polydipsia. His initial BG was 340. Initial serum glucose was 354. Serum CO2 was 18. Venous pH was 7.367.  Hemoglobin A1c was 13.3%. Serum C-peptide was 0.68 (normal 0.80-390). Urine glucose was >1000 and urine ketones were >80. Anti-islet cell antibody was 40 (normal <5). Insulin antibodies were 5.0 (normal <0.4).Anti-GAD antibody was negative at <1.0. TSH was 2.344, free T4 1.36, free T3 3.5.  Tissue transglutaminase IgA was 2.2 (normal <20). Gliadin IgA antibodies were 4.9 (normal <20). He was started on Lantus as a basal insulin and Novolog as a bolus insulin at mealtimes, and also at bedtime and 2 AM if needed. After receiving IV fluids, insulins, and DM education, he was discharged on 11/17/11.   2. Since last visit to [facility] on 05/2021, he has been well. No ER visits or hospitalizations.    He is using Tslim insulin pump and Dexcom CGM. He has not had many failed pump sites other then the one that resulted in DKA. He has been trying harder to remember to bolus when he eats, usually boluses after eating. Rotating pump sites between abdomen and legs. Hypoglycemia occurs "randomly" but does not feel like it is often. He estimates his carbs and often undercounts.   He is currently living in a hotel while his house is getting renovated. He is not currently working. He walks on treadmill or lifts weights a few times per week for exercise.   Insulin regimen: Tsim insulin pump  - Basal Rates 12AM 1.85  3am 1.95  6am 2.0   8am 2.20   9pm  2.20    50.6  units per day   Insulin to Carbohydrate Ratio 12AM 10  6am 4  11am 3   4pm 4        Insulin Sensitivity Factor 12AM 24  3am 24  6am 24  7am 20  8am 9pm 11pm _0 Target Blood Glucose 12AM 150  6am 110  9pm 150          Hypoglycemia: Is not consistently able to feel low blood sugars.practice. No glucagon needed.  Insulin pump download:  - Insulin pump download shows he is bolusing 1 time per day on average. Many days with no boluses. He also appears to be undercounting carbs, entering 15-35 grams.   Med-alert ID: Bracelet  Injection sites: legs, abdomen  Annual labs due: Next visit  Ophthalmology due: 2021. Discussed he is overdue. Needs to have exam.     3. ROS: Greater than 10 systems reviewed with pertinent positives listed in HPI, otherwise neg. Constitutional: Sleeping well. Weight stable.  Eyes: No changes in vision. No blurry vision.  Ears/Nose/Mouth/Throat: No difficulty swallowing. Denies neck pain  Cardiovascular: No palpitations. Denies chest pain  Respiratory: No increased work of breathing. Denies SOB   Neurologic: Normal sensation, no tremor GI: No abdominal pain. No constipation/diarrhea.  Endocrine: No polydipsia.  No hyperpigmentation Psychiatric: Normal affect today. Denies depression and anxiety   Past Medical History:   Past Medical History:  Diagnosis Date   Diabetes mellitus  11/14/2011   Diabetes mellitus without complication ([place])    Phreesia 03/03/2020    Medications:  Outpatient Encounter Medications as of 07/13/2021  Medication Sig Note   Accu-Chek Softclix Lancets lancets Use as instructed    Blood Glucose Monitoring Suppl (ACCU-CHEK GUIDE ME) w/Device KIT 1 kit by Does not apply route as directed.    Continuous Blood Gluc Receiver (DEXCOM G6 RECEIVER) DEVI 1 Device by Does not apply route as directed.    Continuous Blood Gluc Sensor (DEXCOM G6 SENSOR) MISC INJECT 1 APPLICATOR INTO THE SKIN AS DIRECTED(CHANGE SENSOR EVERY  10 DAYS)    Continuous Blood Gluc Transmit (DEXCOM G6 TRANSMITTER) MISC INJECT 1 DEVICE INTO THE SKIN AS DIRECTED( RE-USE UP TP 8X WITH EACH NEW SENSOR)    glucagon 1 MG injection Use for Severe Hypoglycemia, if unresponsive, unable to swallow, unconscious and/or has seizure 04/20/2020: .   glucose blood (ACCU-CHEK GUIDE) test strip Use as instructed    insulin aspart (NOVOLOG FLEXPEN) 100 UNIT/ML FlexPen Back up for pump malfunction (Patient not taking: Reported on 03/21/2021)    Insulin Glargine (LANTUS SOLOSTAR) 100 UNIT/ML Solostar Pen Use up to 50 units daily as directed by MD (Patient not taking: Reported on 03/21/2021) 08/04/2019: For back up   Insulin Human (INSULIN PUMP) SOLN Inject 1 each into the skin See admin instructions. Use with Humalog.    insulin lispro (HUMALOG) 100 UNIT/ML injection Use 300 units in insulin pump every 48 hours    No facility-administered encounter medications on file as of 07/13/2021.    Allergies: Allergies  Allergen Reactions   Amoxicillin     Does not recall what reaction was Mother called MD to find allergy. Confirms amoxicillin    Surgical History: Past Surgical History:  Procedure Laterality Date   nasal cauterization     NASAL HEMORRHAGE CONTROL  05/14/2006    Family History:  Family History  Problem Relation Age of Onset   Asthma Maternal Aunt    Cancer Maternal Grandfather    Diabetes Paternal Grandmother    Hypertension Other    Thyroid disease Neg Hx       Social History: Lives with: mother  Graduated. Unemployed currently   Physical Exam:  There were no vitals filed for this visit.     There were no vitals taken for this visit. Body mass index: body mass index is unknown because there is no height or weight on file. Blood pressure percentiles are not available for patients who are 18 years or older.  Ht Readings from Last 3 Encounters:  03/21/21 $RemoveB'5\' 5"'xNhxnslu$  (1.651 m) (6 %, Z= -1.59)*  02/07/21 5' 5.35" (1.66 m) (7 %, Z= -1.46)*   08/02/20 5' 5.35" (1.66 m) (8 %, Z= -1.43)*   * Growth percentiles are based on CDC (Boys, 2-20 Years) data.   Wt Readings from Last 3 Encounters:  06/12/21 182 lb (82.6 kg) (84 %, Z= 1.00)*  03/21/21 167 lb 1.6 oz (75.8 kg) (71 %, Z= 0.57)*  02/07/21 182 lb 6.4 oz (82.7 kg) (85 %, Z= 1.05)*   * Growth percentiles are based on CDC (Boys, 2-20 Years) data.    PHYSICAL EXAM: General: Well developed, well nourished male in no acute distress.  Head: Normocephalic, atraumatic.   Eyes:  Pupils equal and round. EOMI.  Sclera white.  No eye drainage.   Ears/Nose/Mouth/Throat: Nares patent, no nasal drainage.  Normal dentition, mucous membranes moist.  Neck: supple, no cervical lymphadenopathy, no thyromegaly Cardiovascular: regular rate, normal S1/S2, no  murmurs Respiratory: No increased work of breathing.  Lungs clear to auscultation bilaterally.  No wheezes. Abdomen: soft, nontender, nondistended. Normal bowel sounds.  No appreciable masses  Extremities: warm, well perfused, cap refill < 2 sec.   Musculoskeletal: Normal muscle mass.  Normal strength Skin: warm, dry.  No rash or lesions. Neurologic: alert and oriented, normal speech, no tremor   Labs:  Results for orders placed or performed in visit on 06/12/21  POCT Glucose (Device for Home Use)  Result Value Ref Range   Glucose Fasting, POC     POC Glucose 100 (A) 70 - 99 mg/dl  POCT glycosylated hemoglobin (Hb A1C)  Result Value Ref Range   Hemoglobin A1C 9.7 (A) 4.0 - 5.6 %   HbA1c POC (<> result, manual entry)     HbA1c, POC (prediabetic range)     HbA1c, POC (controlled diabetic range)      Assessment/Plan: Ricky Shaw is a 21 y.o. male with uncontrolled type 1 diabetes using Tandem Tslim insulin pump. He has made improvements to diabetes care since admission for DKA. However, he is frequently skipping bolus insulin at breakfast and dinner which is leading to hyperglycemia. His hemoglobin A 1c has improved to 9.7% but is  higher then ADA goal of <7%.   1-4. DM w/o complication type I, uncontrolled (HCC)/Hyperglycemia/hypoglycemia unawareness/elevated a1c  - Reviewed insulin pump and CGM download. Discussed trends and patterns.  - Rotate pump sites to prevent scar tissue.  - bolus 15 minutes prior to eating to limit blood sugar spikes.  - Reviewed carb counting and importance of accurate carb counting.  - Discussed signs and symptoms of hypoglycemia. Always have glucose available.  - POCT glucose and hemoglobin A1c  - Reviewed growth chart.    4. Adjustment reaction/noncompliance  - Discussed barriers to care and possible complications of uncontrolled type 1 diabetes.   5. Insulin pump Titration /Insulin pump in place.  No changes today. Work on [employer].   Follow-up:  4 weeks.   LOS:>45 spent today reviewing the medical chart, counseling the patient/family, and documenting today's visit.    When a patient is on insulin, intensive monitoring of blood glucose levels is necessary to avoid hyperglycemia and hypoglycemia. Severe hyperglycemia/hypoglycemia can lead to hospital admissions and be life threatening.     Hermenia Bers,  FNP-C  Pediatric Specialist  [address] [place]  [place], [address]  Tele: [phone]

## 2021-07-18 ENCOUNTER — Telehealth (INDEPENDENT_AMBULATORY_CARE_PROVIDER_SITE_OTHER): Payer: Self-pay

## 2021-07-20 ENCOUNTER — Telehealth (INDEPENDENT_AMBULATORY_CARE_PROVIDER_SITE_OTHER): Payer: Self-pay

## 2021-07-20 NOTE — Telephone Encounter (Signed)
No PA needed for sensors. Patient is trying to pick up too soon.  ?

## 2021-09-09 ENCOUNTER — Other Ambulatory Visit (INDEPENDENT_AMBULATORY_CARE_PROVIDER_SITE_OTHER): Payer: Self-pay | Admitting: Family

## 2021-10-04 ENCOUNTER — Other Ambulatory Visit (INDEPENDENT_AMBULATORY_CARE_PROVIDER_SITE_OTHER): Payer: Self-pay | Admitting: Pediatrics

## 2021-10-04 DIAGNOSIS — E1065 Type 1 diabetes mellitus with hyperglycemia: Secondary | ICD-10-CM

## 2021-10-24 ENCOUNTER — Telehealth (INDEPENDENT_AMBULATORY_CARE_PROVIDER_SITE_OTHER): Payer: Self-pay

## 2021-10-31 NOTE — Telephone Encounter (Signed)
Dexcom Sensors and Transmitter DENIED     In dispense History, states that Transmitter was picked up on 10/23/21 and Sensors last picked up on 10/05/21

## 2021-12-08 ENCOUNTER — Other Ambulatory Visit: Payer: Self-pay

## 2021-12-08 ENCOUNTER — Encounter (HOSPITAL_COMMUNITY): Payer: Self-pay | Admitting: Emergency Medicine

## 2021-12-08 ENCOUNTER — Inpatient Hospital Stay (HOSPITAL_COMMUNITY)
Admission: EM | Admit: 2021-12-08 | Discharge: 2021-12-11 | DRG: 919 | Disposition: A | Discharge status: Home / Self Care | Payer: Medicaid Other | Attending: Internal Medicine | Admitting: Internal Medicine

## 2021-12-08 DIAGNOSIS — T85694A Other mechanical complication of insulin pump, initial encounter: Secondary | ICD-10-CM | POA: Diagnosis present

## 2021-12-08 DIAGNOSIS — Z833 Family history of diabetes mellitus: Secondary | ICD-10-CM

## 2021-12-08 DIAGNOSIS — Z794 Long term (current) use of insulin: Secondary | ICD-10-CM

## 2021-12-08 DIAGNOSIS — Z720 Tobacco use: Secondary | ICD-10-CM | POA: Diagnosis not present

## 2021-12-08 DIAGNOSIS — Z9641 Presence of insulin pump (external) (internal): Secondary | ICD-10-CM | POA: Diagnosis present

## 2021-12-08 DIAGNOSIS — E1110 Type 2 diabetes mellitus with ketoacidosis without coma: Secondary | ICD-10-CM | POA: Diagnosis present

## 2021-12-08 DIAGNOSIS — E1010 Type 1 diabetes mellitus with ketoacidosis without coma: Secondary | ICD-10-CM | POA: Diagnosis present

## 2021-12-08 DIAGNOSIS — E876 Hypokalemia: Secondary | ICD-10-CM | POA: Diagnosis present

## 2021-12-08 DIAGNOSIS — E1069 Type 1 diabetes mellitus with other specified complication: Secondary | ICD-10-CM | POA: Diagnosis not present

## 2021-12-08 DIAGNOSIS — Z880 Allergy status to penicillin: Secondary | ICD-10-CM

## 2021-12-08 DIAGNOSIS — T85614A Breakdown (mechanical) of insulin pump, initial encounter: Secondary | ICD-10-CM | POA: Diagnosis not present

## 2021-12-08 DIAGNOSIS — Y742 Prosthetic and other implants, materials and accessory general hospital and personal-use devices associated with adverse incidents: Secondary | ICD-10-CM | POA: Diagnosis present

## 2021-12-08 LAB — CBG MONITORING, ED
Glucose-Capillary: 535 mg/dL (ref 70–99)
Glucose-Capillary: >600 mg/dL (ref 70–99)
Glucose-Capillary: 445 mg/dL — ABNORMAL HIGH (ref 70–99)
Glucose-Capillary: 531 mg/dL (ref 70–99)
Glucose-Capillary: 356 mg/dL — ABNORMAL HIGH (ref 70–99)
Glucose-Capillary: 320 mg/dL — ABNORMAL HIGH (ref 70–99)
Glucose-Capillary: 334 mg/dL — ABNORMAL HIGH (ref 70–99)
Glucose-Capillary: 216 mg/dL — ABNORMAL HIGH (ref 70–99)
Glucose-Capillary: 208 mg/dL — ABNORMAL HIGH (ref 70–99)
Glucose-Capillary: 189 mg/dL — ABNORMAL HIGH (ref 70–99)

## 2021-12-08 LAB — URINALYSIS, ROUTINE W REFLEX MICROSCOPIC
Bilirubin Urine: NEGATIVE
Glucose, UA: >=500 mg/dL — AB
Hgb urine dipstick: NEGATIVE
Ketones, ur: 80 mg/dL — AB
Leukocytes,Ua: NEGATIVE
Nitrite: NEGATIVE
Protein, ur: NEGATIVE mg/dL
Specific Gravity, Urine: 1.024 (ref 1.005–1.030)
pH: 5 (ref 5.0–8.0)

## 2021-12-08 LAB — BASIC METABOLIC PANEL
Anion gap: 21 — ABNORMAL HIGH (ref 5–15)
Anion gap: 16 — ABNORMAL HIGH (ref 5–15)
Anion gap: 11 (ref 5–15)
Anion gap: 9 (ref 5–15)
BUN: 21 mg/dL — ABNORMAL HIGH (ref 6–20)
BUN: 17 mg/dL (ref 6–20)
BUN: 14 mg/dL (ref 6–20)
BUN: 11 mg/dL (ref 6–20)
CO2: 12 mmol/L — ABNORMAL LOW (ref 22–32)
CO2: 13 mmol/L — ABNORMAL LOW (ref 22–32)
CO2: 21 mmol/L — ABNORMAL LOW (ref 22–32)
CO2: 22 mmol/L (ref 22–32)
Calcium: 9.2 mg/dL (ref 8.9–10.3)
Calcium: 9 mg/dL (ref 8.9–10.3)
Calcium: 9.2 mg/dL (ref 8.9–10.3)
Calcium: 9.1 mg/dL (ref 8.9–10.3)
Chloride: 101 mmol/L (ref 98–111)
Chloride: 106 mmol/L (ref 98–111)
Chloride: 107 mmol/L (ref 98–111)
Chloride: 107 mmol/L (ref 98–111)
Creatinine, Ser: 1.52 mg/dL — ABNORMAL HIGH (ref 0.61–1.24)
Creatinine, Ser: 1.3 mg/dL — ABNORMAL HIGH (ref 0.61–1.24)
Creatinine, Ser: 1.17 mg/dL (ref 0.61–1.24)
Creatinine, Ser: 1.11 mg/dL (ref 0.61–1.24)
GFR, Estimated: >60 mL/min (ref >60)
GFR, Estimated: >60 mL/min (ref >60)
GFR, Estimated: >60 mL/min (ref >60)
GFR, Estimated: >60 mL/min (ref >60)
Glucose, Bld: 475 mg/dL — ABNORMAL HIGH (ref 70–99)
Glucose, Bld: 266 mg/dL — ABNORMAL HIGH (ref 70–99)
Glucose, Bld: 189 mg/dL — ABNORMAL HIGH (ref 70–99)
Glucose, Bld: 171 mg/dL — ABNORMAL HIGH (ref 70–99)
Potassium: 4.9 mmol/L (ref 3.5–5.1)
Potassium: 4.6 mmol/L (ref 3.5–5.1)
Potassium: 4.5 mmol/L (ref 3.5–5.1)
Potassium: 4.2 mmol/L (ref 3.5–5.1)
Sodium: 134 mmol/L — ABNORMAL LOW (ref 135–145)
Sodium: 135 mmol/L (ref 135–145)
Sodium: 139 mmol/L (ref 135–145)
Sodium: 138 mmol/L (ref 135–145)

## 2021-12-08 LAB — HEPATIC FUNCTION PANEL
ALT: 12 U/L (ref 0–44)
AST: 16 U/L (ref 15–41)
Albumin: 3.7 g/dL (ref 3.5–5.0)
Alkaline Phosphatase: 54 U/L (ref 38–126)
Bilirubin, Direct: 0.3 mg/dL — ABNORMAL HIGH (ref 0.0–0.2)
Indirect Bilirubin: 1.3 mg/dL — ABNORMAL HIGH (ref 0.3–0.9)
Total Bilirubin: 1.6 mg/dL — ABNORMAL HIGH (ref 0.3–1.2)
Total Protein: 6.6 g/dL (ref 6.5–8.1)

## 2021-12-08 LAB — CBC WITH DIFFERENTIAL/PLATELET
Abs Immature Granulocytes: 0.1 10*3/uL — ABNORMAL HIGH (ref 0.00–0.07)
Basophils Absolute: 0.1 10*3/uL (ref 0.0–0.1)
Basophils Relative: 0 %
Eosinophils Absolute: 0 10*3/uL (ref 0.0–0.5)
Eosinophils Relative: 0 %
HCT: 48.1 % (ref 39.0–52.0)
Hemoglobin: 15.4 g/dL (ref 13.0–17.0)
Immature Granulocytes: 1 %
Lymphocytes Relative: 14 %
Lymphs Abs: 2.2 10*3/uL (ref 0.7–4.0)
MCH: 29.4 pg (ref 26.0–34.0)
MCHC: 32 g/dL (ref 30.0–36.0)
MCV: 92 fL (ref 80.0–100.0)
Monocytes Absolute: 0.8 10*3/uL (ref 0.1–1.0)
Monocytes Relative: 5 %
Neutro Abs: 12.7 10*3/uL — ABNORMAL HIGH (ref 1.7–7.7)
Neutrophils Relative %: 80 %
Platelets: 281 10*3/uL (ref 150–400)
RBC: 5.23 MIL/uL (ref 4.22–5.81)
RDW: 13.2 % (ref 11.5–15.5)
WBC: 15.8 10*3/uL — ABNORMAL HIGH (ref 4.0–10.5)
nRBC: 0 % (ref 0.0–0.2)

## 2021-12-08 LAB — I-STAT VENOUS BLOOD GAS, ED
Acid-base deficit: 12 mmol/L — ABNORMAL HIGH (ref 0.0–2.0)
Bicarbonate: 15.4 mmol/L — ABNORMAL LOW (ref 20.0–28.0)
Calcium, Ion: 1.12 mmol/L — ABNORMAL LOW (ref 1.15–1.40)
HCT: 50 % (ref 39.0–52.0)
Hemoglobin: 17 g/dL (ref 13.0–17.0)
O2 Saturation: 99 %
Potassium: 5 mmol/L (ref 3.5–5.1)
Sodium: 130 mmol/L — ABNORMAL LOW (ref 135–145)
TCO2: 16 mmol/L — ABNORMAL LOW (ref 22–32)
pCO2, Ven: 37.3 mmHg — ABNORMAL LOW (ref 44–60)
pH, Ven: 7.223 — ABNORMAL LOW (ref 7.25–7.43)
pO2, Ven: 153 mmHg — ABNORMAL HIGH (ref 32–45)

## 2021-12-08 LAB — BETA-HYDROXYBUTYRIC ACID: Beta-Hydroxybutyric Acid: 7.85 mmol/L — ABNORMAL HIGH (ref 0.05–0.27)

## 2021-12-08 LAB — GLUCOSE, CAPILLARY
Glucose-Capillary: 155 mg/dL — ABNORMAL HIGH (ref 70–99)
Glucose-Capillary: 163 mg/dL — ABNORMAL HIGH (ref 70–99)
Glucose-Capillary: 164 mg/dL — ABNORMAL HIGH (ref 70–99)
Glucose-Capillary: 164 mg/dL — ABNORMAL HIGH (ref 70–99)
Glucose-Capillary: 175 mg/dL — ABNORMAL HIGH (ref 70–99)
Glucose-Capillary: 186 mg/dL — ABNORMAL HIGH (ref 70–99)

## 2021-12-08 LAB — HIV ANTIBODY (ROUTINE TESTING W REFLEX): HIV Screen 4th Generation wRfx: NONREACTIVE

## 2021-12-08 LAB — LIPASE, BLOOD: Lipase: 18 U/L (ref 11–51)

## 2021-12-08 MED ORDER — ACETAMINOPHEN 325 MG PO TABS
650.0000 mg | ORAL_TABLET | Freq: Four times a day (QID) | ORAL | Status: DC | PRN
  Administered 2021-12-08 – 2021-12-09 (×2): 650 mg via ORAL
  Filled 2021-12-08 (×2): qty 2

## 2021-12-08 MED ORDER — ACETAMINOPHEN 650 MG RE SUPP: 650.0000 mg | Freq: Four times a day (QID) | RECTAL | Status: DC | PRN

## 2021-12-08 MED ORDER — LACTATED RINGERS IV SOLN: INTRAVENOUS | Status: DC

## 2021-12-08 MED ORDER — INSULIN ASPART 100 UNIT/ML IJ SOLN
0.0000 [IU] | Freq: Three times a day (TID) | INTRAMUSCULAR | Status: DC
  Administered 2021-12-09: 3 [IU] via SUBCUTANEOUS
  Administered 2021-12-09: 7 [IU] via SUBCUTANEOUS
  Administered 2021-12-09: 5 [IU] via SUBCUTANEOUS
  Administered 2021-12-10 (×2): 2 [IU] via SUBCUTANEOUS
  Administered 2021-12-10: 3 [IU] via SUBCUTANEOUS

## 2021-12-08 MED ORDER — INSULIN ASPART 100 UNIT/ML IJ SOLN
0.0000 [IU] | Freq: Every day | INTRAMUSCULAR | Status: DC
  Administered 2021-12-09: 4 [IU] via SUBCUTANEOUS

## 2021-12-08 MED ORDER — INSULIN REGULAR(HUMAN) IN NACL 100-0.9 UT/100ML-% IV SOLN
INTRAVENOUS | Status: DC
  Administered 2021-12-08: 11.5 [IU]/h via INTRAVENOUS
  Filled 2021-12-08: qty 100

## 2021-12-08 MED ORDER — ONDANSETRON HCL 4 MG PO TABS: 4.0000 mg | ORAL_TABLET | Freq: Four times a day (QID) | ORAL | Status: DC | PRN

## 2021-12-08 MED ORDER — ENOXAPARIN SODIUM 40 MG/0.4ML IJ SOSY
40.0000 mg | PREFILLED_SYRINGE | INTRAMUSCULAR | Status: DC
  Administered 2021-12-08: 40 mg via SUBCUTANEOUS
  Filled 2021-12-08 (×2): qty 0.4

## 2021-12-08 MED ORDER — DEXTROSE 50 % IV SOLN
0.0000 mL | INTRAVENOUS | Status: DC | PRN
  Administered 2021-12-10: 25 mL via INTRAVENOUS
  Filled 2021-12-08: qty 50

## 2021-12-08 MED ORDER — DEXTROSE IN LACTATED RINGERS 5 % IV SOLN: INTRAVENOUS | Status: DC

## 2021-12-08 MED ORDER — INSULIN GLARGINE-YFGN 100 UNIT/ML ~~LOC~~ SOLN
20.0000 [IU] | Freq: Every day | SUBCUTANEOUS | Status: DC
Start: 2021-12-08 — End: 2021-12-09
  Administered 2021-12-08: 20 [IU] via SUBCUTANEOUS
  Filled 2021-12-08 (×2): qty 0.2

## 2021-12-08 MED ORDER — ONDANSETRON HCL 4 MG/2ML IJ SOLN: 4.0000 mg | Freq: Four times a day (QID) | INTRAMUSCULAR | Status: DC | PRN

## 2021-12-08 MED ORDER — LACTATED RINGERS IV BOLUS
2000.0000 mL | Freq: Once | INTRAVENOUS | Status: AC
  Administered 2021-12-08: 2000 mL via INTRAVENOUS

## 2021-12-08 NOTE — ED Notes (Signed)
Lab agreed to add on hepatic function and Lipase

## 2021-12-08 NOTE — ED Triage Notes (Signed)
Patient here with complaint of hyperglycemia that started this morning, patient states overnight his insulin pump alerted him that the catheter was blocked. Patient states he replaced catheter but error persisted. Then this morning his glucose meter read "high". Patient is alert, oriented, and in no apparent distress at this time.

## 2021-12-08 NOTE — ED Notes (Signed)
Admitting team returned page to RN, bed orders to be changed. Per IM team, RN to contact them for subq insulin once anion gap is within range x 2. Oncoming RN, Josh, made aware.

## 2021-12-08 NOTE — Inpatient Diabetes Management (Addendum)
Inpatient Diabetes Program Recommendations  AACE/ADA: New Consensus Statement on Inpatient Glycemic Control (2015)  Target Ranges:  Prepandial:   less than 140 mg/dL      Peak postprandial:   less than 180 mg/dL (1-2 hours)      Critically ill patients:  140 - 180 mg/dL   Lab Results  Component Value Date   GLUCAP 535 (HH) 12/08/2021   HGBA1C 9.7 (A) 06/12/2021    Review of Glycemic Control  Diabetes history: DM1 Outpatient Diabetes medications: T Slim insulin pump  Inpatient Diabetes Program Recommendations:   Attempted to speak with patient @ bedside but patient sound asleep, so spoke with patient's mom. Mom woke this morning to go to work and patient told her he had been vomiting through the night so brought him to the ED. CBG was not checked prior to leaving home. Patient told his mom he had switched insulin pump sites 3 times (each thigh and back on one arm) but glucose levels were not improving. Insulin pump beside patient's bed and has insulin pump supplies (per mom) in the bag in his room. Patient also wears a dexcom sensor. Discussed with mom the use of short acting insulin patient has @ home to use when his pump is not working and to review with Barron Alvine NP on next visit. Patient does not currently have Lantus as long acting backup when insulin pump is not controlling CBGs.  Spoke with patient on 03/21/21 on prior admission with DKA. Patient sees Barron Alvine for endocrinology with last office visit 06/12/21. See below for settings @ this visit.  Insulin regimen: Tslim insulin pump  - Basal Rates 12AM 1.85  3am 1.95  6am 2.0   8am 2.20   9pm   2.20     50.6 units per day    Insulin to Carbohydrate Ratio 12AM 10  6am 4  11am 3   4pm 4             Insulin Sensitivity Factor 12AM 24  3am 24  6am 24  7am 20  8am 9pm 11pm 20 20 24     Target Blood Glucose 12AM 150  6am 110  9pm 150             Thank you, Tarrah Furuta E. Oneta Sigman, RN, MSN, CDE   Diabetes Coordinator Inpatient Glycemic Control Team Team Pager [phone] (8am-5pm) 12/08/2021 9:11 AM

## 2021-12-08 NOTE — ED Notes (Signed)
Floor RN has no questions.  PT can come up.  

## 2021-12-08 NOTE — ED Provider Notes (Signed)
[facility] EMERGENCY DEPARTMENT Provider Note   CSN: [id number] Arrival date & time: 12/08/21  4967     History  Chief Complaint  Patient presents with   Hyperglycemia    Ricky Shaw is a 21 y.o. male.  He has type 1 diabetes who presents with headache, chest pain, vomiting since about 6am after his insulin pump dysfunctioned last night. He reports multiple alerts on his pump last night concerning blocked tubing, which he changed multiple times and is unsure if it is working. He reports vomiting just once at about 6 am. He reports the headache and chest pain began this am. He rates the head ache as "12" /10 on the pain scale. He denies symptoms prior to last night. Denies urinary frequency, urgency. He reports this is his second insulin pump, and he got the first pump when he was around 21 yo. He denies any other medical conditions.  Sugars were normal for him prior to pump malfunction.  Denies preceding vomiting or diarrhea.       Home Medications Prior to Admission medications   Medication Sig Start Date End Date Taking? Authorizing Provider  Accu-Chek Softclix Lancets lancets Use as instructed 04/21/20   Levon Hedger, MD  Blood Glucose Monitoring Suppl (ACCU-CHEK GUIDE ME) w/Device KIT 1 kit by Does not apply route as directed. 04/21/20   Levon Hedger, MD  Continuous Blood Gluc Receiver (DEXCOM G6 RECEIVER) DEVI 1 Device by Does not apply route as directed. 12/21/19   Hermenia Bers, NP  Continuous Blood Gluc Sensor (DEXCOM G6 SENSOR) MISC INJECT 1 APPLICATOR UNDER THE SKIN AS DIRECTED( CHANGE SENSOR EVERY 10 DAYS) 10/05/21   Hermenia Bers, NP  Continuous Blood Gluc Transmit (DEXCOM G6 TRANSMITTER) MISC INJECT 1 DEVICE INTO THE SKIN AS DIRECTED( RE-USE UP TP 8X WITH EACH NEW SENSOR) 03/10/21   Hermenia Bers, NP  glucagon 1 MG injection Use for Severe Hypoglycemia, if unresponsive, unable to swallow, unconscious and/or has seizure  09/05/16   Hermenia Bers, NP  glucose blood (ACCU-CHEK GUIDE) test strip Use as instructed 04/21/20   Levon Hedger, MD  insulin aspart (NOVOLOG FLEXPEN) 100 UNIT/ML FlexPen Back up for pump malfunction Patient not taking: Reported on 03/21/2021 08/25/18   Hermenia Bers, NP  Insulin Glargine (LANTUS SOLOSTAR) 100 UNIT/ML Solostar Pen Use up to 50 units daily as directed by MD Patient not taking: Reported on 03/21/2021 05/29/16   Hermenia Bers, NP  Insulin Human (INSULIN PUMP) SOLN Inject 1 each into the skin See admin instructions. Use with Humalog.    [provider]  insulin lispro (HUMALOG) 100 UNIT/ML injection USE 300 UNITS IN INSULIN PUMP EVERY 48 HOURS 09/11/21   Hermenia Bers, NP      Allergies    Amoxicillin    Review of Systems   Review of Systems  Physical Exam Updated Vital Signs BP (!) 145/89 (BP Location: Left Arm)   Pulse (!) 120   Temp 98.2 F (36.8 C) (Oral)   Resp 18   SpO2 95%   Physical Exam Vitals and nursing note reviewed.  Constitutional:      General: He is not in acute distress.    Appearance: He is well-developed.  HENT:     Head: Normocephalic and atraumatic.     Mouth/Throat:     Mouth: Mucous membranes are moist.  Eyes:     General:        Right eye: No discharge.  Left eye: No discharge.     Conjunctiva/sclera: Conjunctivae normal.  Cardiovascular:     Rate and Rhythm: Regular rhythm. Tachycardia present.     Heart sounds: Normal heart sounds.  Pulmonary:     Effort: Pulmonary effort is normal.     Breath sounds: Normal breath sounds.     Comments: Respirations clear.  No Kussmaul respirations. Abdominal:     Palpations: Abdomen is soft.     Tenderness: There is abdominal tenderness. There is no guarding or rebound.     Comments: Generalized abdominal tenderness without rebound or guarding  Musculoskeletal:     Cervical back: Normal range of motion and neck supple.  Skin:    General: Skin is warm and dry.   Neurological:     General: No focal deficit present.     Mental Status: He is alert and oriented to person, place, and time.     Motor: No weakness.  Psychiatric:        Mood and Affect: Mood normal.     ED Results / Procedures / Treatments   Labs (all labs ordered are listed, but only abnormal results are displayed) Labs Reviewed  CBC WITH DIFFERENTIAL/PLATELET - Abnormal; Notable for the following components:      Result Value   WBC 15.8 (*)    Neutro Abs 12.7 (*)    Abs Immature Granulocytes 0.10 (*)    All other components within normal limits  CBG MONITORING, ED - Abnormal; Notable for the following components:   Glucose-Capillary 535 (*)    All other components within normal limits  I-STAT VENOUS BLOOD GAS, ED - Abnormal; Notable for the following components:   pH, Ven 7.223 (*)    pCO2, Ven 37.3 (*)    pO2, Ven 153 (*)    Bicarbonate 15.4 (*)    TCO2 16 (*)    Acid-base deficit 12.0 (*)    Sodium 130 (*)    Calcium, Ion 1.12 (*)    All other components within normal limits  CBG MONITORING, ED - Abnormal; Notable for the following components:   Glucose-Capillary >600 (*)    All other components within normal limits  URINALYSIS, ROUTINE W REFLEX MICROSCOPIC  BETA-HYDROXYBUTYRIC ACID  HEPATIC FUNCTION PANEL  LIPASE, BLOOD  BASIC METABOLIC PANEL    ED ECG REPORT   Date: 12/08/2021  Rate: 115  Rhythm: sinus tachycardia  QRS Axis: normal  Intervals: normal  ST/T Wave abnormalities: nonspecific ST/T changes  Conduction Disutrbances:none  Narrative Interpretation:   Old EKG Reviewed: unchanged from 03/2021  I have personally reviewed the EKG tracing and agree with the computerized printout as noted.  Radiology No results found.  Procedures Procedures    Medications Ordered in ED Medications  lactated ringers bolus 2,000 mL (has no administration in time range)    ED Course/ Medical Decision Making/ A&P Clinical Course as of 12/08/21 1205  Fri Dec 08, 2021  1030 This is a 21 year old patient with a history of type 1 diabetes on an insulin pump presenting to the ED with nausea, vomiting and fatigue, reporting that his insulin pump stopped working yesterday evening, had not been active for about 6 hours.  Any woke up feeling sick this morning.  On presentation he was afebrile, tachycardic, labs consistent with DKA, with a pH of 7.2, bicarb 15, ketones in the urine.  First 2 attempts to do BMP were unfortunately hemolyzed or lab could not run the sample, tenderness and yet another BMP.  Most  recent Accu-Chek 445.  Patient has been given appropriate amounts of fluids, 2 L infusion, followed by insulin.  Potassium was 5.0 on i-STAT venous gas.  He is otherwise stable and well-appearing.  He complains of a headache.  He will need medical admission for DKA [MT]    Clinical Course User Index [MT] Trifan, Carola Rhine, MD    Patient seen and examined. History obtained directly from patient.   Labs/EKG: Independently reviewed and interpreted.  This included: CBC, BMP, beta hydroxybutyrate, VBG, UA.  EKG personally reviewed and interpreted as above.  Imaging: None ordered.  Nonfocal abdominal exam at this time.  Medications/Fluids: Lactated Ringer's 2 L ordered.  Most recent vital signs reviewed and are as follows: BP (!) 145/89 (BP Location: Left Arm)   Pulse (!) 120   Temp 98.2 F (36.8 C) (Oral)   Resp 18   SpO2 95%   Initial impression: Mild DKA secondary to insulin pump malfunction.  12:05 PM Reassessment performed. Patient appears stable.   BMP hemolyzed multiple times however now returned.   Discussed with Dr. Langston Masker who has seen patient.   Labs personally reviewed and interpreted including: CBC with elevated white blood cell count at 15.8 suspect reactive from DKA otherwise unremarkable; BMP consistent with elevated anion gap metabolic acidosis with gap of 21, bicarbonate 12, mild AKI with creatinine 1.52 and glucose 475; beta  hydroxybutyrate elevated at 7.85; UA with 80 ketones and glucose noted; venous blood gas pH 7.22 with low bicarbonate, potassium 5.0.  CBG has been improving with treatment, most recent check 375.  Imaging personally visualized and interpreted including:  Reviewed pertinent lab work and imaging with patient at bedside. Questions answered.   Most current vital signs reviewed and are as follows: BP (!) 117/52   Pulse (!) 105   Temp 98.2 F (36.8 C) (Oral)   Resp 19   SpO2 96%   Plan: Admit to hospital.  I have consulted with internal medicine teaching service resident who accepts patient for admission.  Will see shortly.  CRITICAL CARE Performed by: Carlisle Cater PA-C Total critical care time: 40 minutes Critical care time was exclusive of separately billable procedures and treating other patients. Critical care was necessary to treat or prevent imminent or life-threatening deterioration. Critical care was time spent personally by me on the following activities: development of treatment plan with patient and/or surrogate as well as nursing, discussions with consultants, evaluation of patient's response to treatment, examination of patient, obtaining history from patient or surrogate, ordering and performing treatments and interventions, ordering and review of laboratory studies, ordering and review of radiographic studies, pulse oximetry and re-evaluation of patient's condition.                           Medical Decision Making Amount and/or Complexity of Data Reviewed Labs: ordered.  Risk Prescription drug management. Decision regarding hospitalization.   Patient with DKA likely 2/2 insulin pump malfunction and not receiving insulin overnight.  No focal abdominal pain on exam, but will need to be monitored and rechecked.  He has a headache but is neurologically intact.        Final Clinical Impression(s) / ED Diagnoses Final diagnoses:  Diabetic ketoacidosis without coma  associated with type 1 diabetes mellitus [facility])    Rx / DC Orders ED Discharge Orders     None         Carlisle Cater, PA-C 12/08/21 1211    Octaviano Glow  J, MD 12/08/21 1337

## 2021-12-08 NOTE — ED Notes (Addendum)
This RN contacted by bed placement to request update for bed order, pt unable to go to tele bed due to current insulin drip. IM team paged to update.

## 2021-12-08 NOTE — Hospital Course (Addendum)
Pump went off sometime overnight, 1-2 am. Attempted troubleshooting, replaced the cord 3x and it still did not work. He has had issues in the past but has not required hospitalization like this except one time.  This morning he felt woozy and had stomach pain, had some vomiting, describes it as red. His mom states she bought the pump and insurance covers the supplies. They tried troubleshooting the pump over the phone and they suspect a cord issue. He has used insulin at home but does not have needles at home.  He does not have a preference to the needles or insulin pump but mom states his insurance will only cover supplies for one or the other. Last BM was yesterday.  No other medical problems He had one hospitalization once and it was an issue with the pump at that time.   Fam hx- type II DM in the family, pancreatic cancer in mothers father Meds- only insulin Allergies- penicillin Surgerys-none Social hx- smokes black and milds once every two weeks, denies alcohol use, endorses marijuana use. Works at [employer]

## 2021-12-08 NOTE — H&P (Cosign Needed)
Date: 12/08/2021               Patient Name:  Ricky Shaw MRN: [id number]  DOB: 2001/03/21 Age / Sex: 21 y.o., male   PCP: [name], MD              Medical Service: Internal Medicine Teaching Service              Attending Physician: Dr. Dickie La, MD    First Contact: Kathalene Frames, MS  Pager: [phone]  Second Contact: Dr. Morene Crocker Pager: [phone]  Third Contact Dr. Merrilyn Puma Pager: [phone]       After Hours (After 5p/  First Contact Pager: [phone]  weekends / holidays): Second Contact Pager: [phone]   Chief Complaint: Hyperglycemia  History of Present Illness: Ricky Shaw 21 y.o. male with PMH of Type I Diabetes Mellitus presenting to the ED for hyperglycemia following insulin pump failure. Pt states overnight between 1-2AM his insulin pump alerted him that the catheter was blocked in which he replaced the catheter 3 times but the error persisted. This morning, his glucometer read "high." He reports feeling woozy, stomach pain, and headache on awakening. He reports one episode of vomit that was small and red in color. He had ate red food for dinner that had hot sauce on it. He does have any other insulin or needles at home and has been unable to give himself any insulin. He reports one prior issues in the past that required hospitalization in November. This morning, he tried to troubleshoot the pump over the phone and they suspect a cord issue but this has been unsuccessful. His mom states she bought the pump and insurance covers the supplies. He has previously used insulin pens to manage his glucose in the past. He does not have a preference to the needles or insulin pump but mom states his insurance will only cover supplies for one or the other. He reports last BM was yesterday. He was diagnosed with DM T79 at 21 years old.  Meds:  Insulin Human (Insulin Pump)- Continuous use  He denies having any other insulin at home for pump malfunction.    Surgeries: None  Allergies: Allergies as of 12/08/2021 - Review Complete 12/08/2021  Allergen Reaction Noted   Amoxicillin Hives 11/14/2011   Past Medical History:  Diagnosis Date   Diabetes mellitus 11/14/2011   Diabetes mellitus without complication (HCC)    Phreesia 03/03/2020   Family History:  He reports multiple family members with type II DM. Pancreatic cancer in mother's father. No history of cardiac disease.  Social History: He lives at home with mom and one other sibling. He currently works at [employer]. He reports smoking black and milds once every two weeks. He denies alcohol use. He reports marijuana use.   Review of Systems: A complete ROS was negative except as per HPI.   Physical Exam: Blood pressure 134/72, pulse (!) 107, temperature 98.9 F (37.2 C), temperature source Oral, resp. rate (!) 21, height 5\' 7"  (1.702 m), weight 79.4 kg, SpO2 97 %.  Constitutional: Well-appearing laying comfortably in bed. NAD HENT: Normocephalic and atraumatic. Moist mucous membranes. Cardiovascular: Regular rhythm. Tachycardic Pulmonary: Clear to auscultation bilaterally. Normal respiratory effort. Skin: Warm and dry. No rash Neurological: Alert and oriented. Psych: Mood and affect normal  EKG: personally reviewed my interpretation is sinus tachycardia at a rate of 115.  CXR: personally reviewed my interpretation is without acute abnormalities.   Assessment & Plan by  Problem: Principal Problem:   Diabetic ketoacidosis (HCC)  Ricky Shaw 21 y.o. male with PMH of Type I Diabetes Mellitus admitted for DKA following insulin pump failure.   DKA Type 1 diabetes on an insulin pump with nausea, vomiting, headache and fatigue, reporting that his insulin pump stopped working during the night., woke up feeling sick this morning. Pt is afebrile, tachycardic, labs consistent with DKA, with a pH of 7.2, bicarb 15, ketones in the urine. Anion gap 21. Glucose >600 on presentation,  currently 334. Patient given 2 L infusion followed by insulin. K+ 4.9. Will continue to monitor for hypokalemia as sugars resolve. He is otherwise stable and well-appearing. Diabetes coordinator reporting unable to get pump inpatient and will need insulin pens at d/c. Pt followed by Gretchen Short NP for endo. - BMP q4h - Endotool until gap closure X2 then SQ insulin to follow - IV LR - Zofran - NPO until off Endotool  Best Practice: VTE: Lovenox Diet: NPO Family Comm: Mom at bedside  Dispo: Admit patient to Observation with expected length of stay less than 2 midnights.  Signed: Kathalene Frames, Medical Student 12/08/2021, 2:31 PM  Pager: [phone]   Attestation for Student Documentation:  I personally was present and performed or re-performed the history, physical exam and medical decision-making activities of this service and have verified that the service and findings are accurately documented in the student's note.  Ricky Shaw is a 21 year old gentleman with type 1 diabetes who presented in DKA secondary to his insulin pump failure.  He prefers to go back on his insulin pump to subcutaneous insulin.  Unfortunately we are unable to get him a new pump here in the hospital but were able to set him up with a follow-up appointment with his endocrinologist on August 3 at 2:30 PM.  He will need a prescription for insulin as well as needles prior to discharge.  Ricky Shaw, Ricky N, DO 12/09/2021, 9:13 AM

## 2021-12-09 DIAGNOSIS — T85614A Breakdown (mechanical) of insulin pump, initial encounter: Secondary | ICD-10-CM

## 2021-12-09 DIAGNOSIS — E1069 Type 1 diabetes mellitus with other specified complication: Secondary | ICD-10-CM | POA: Diagnosis not present

## 2021-12-09 DIAGNOSIS — Z794 Long term (current) use of insulin: Secondary | ICD-10-CM

## 2021-12-09 LAB — CBC WITH DIFFERENTIAL/PLATELET
Abs Immature Granulocytes: 0.02 10*3/uL (ref 0.00–0.07)
Basophils Absolute: 0 10*3/uL (ref 0.0–0.1)
Basophils Relative: 1 %
Eosinophils Absolute: 0.1 10*3/uL (ref 0.0–0.5)
Eosinophils Relative: 1 %
HCT: 38.9 % — ABNORMAL LOW (ref 39.0–52.0)
Hemoglobin: 13.4 g/dL (ref 13.0–17.0)
Immature Granulocytes: 0 %
Lymphocytes Relative: 30 %
Lymphs Abs: 2.2 10*3/uL (ref 0.7–4.0)
MCH: 29.5 pg (ref 26.0–34.0)
MCHC: 34.4 g/dL (ref 30.0–36.0)
MCV: 85.7 fL (ref 80.0–100.0)
Monocytes Absolute: 0.7 10*3/uL (ref 0.1–1.0)
Monocytes Relative: 9 %
Neutro Abs: 4.4 10*3/uL (ref 1.7–7.7)
Neutrophils Relative %: 59 %
Platelets: 230 10*3/uL (ref 150–400)
RBC: 4.54 MIL/uL (ref 4.22–5.81)
RDW: 13.3 % (ref 11.5–15.5)
WBC: 7.4 10*3/uL (ref 4.0–10.5)
nRBC: 0 % (ref 0.0–0.2)

## 2021-12-09 LAB — BASIC METABOLIC PANEL
Anion gap: 9 (ref 5–15)
Anion gap: 12 (ref 5–15)
Anion gap: 13 (ref 5–15)
Anion gap: 17 — ABNORMAL HIGH (ref 5–15)
BUN: 12 mg/dL (ref 6–20)
BUN: 12 mg/dL (ref 6–20)
BUN: 11 mg/dL (ref 6–20)
BUN: 12 mg/dL (ref 6–20)
CO2: 21 mmol/L — ABNORMAL LOW (ref 22–32)
CO2: 19 mmol/L — ABNORMAL LOW (ref 22–32)
CO2: 17 mmol/L — ABNORMAL LOW (ref 22–32)
CO2: 18 mmol/L — ABNORMAL LOW (ref 22–32)
Calcium: 8.9 mg/dL (ref 8.9–10.3)
Calcium: 8.9 mg/dL (ref 8.9–10.3)
Calcium: 8.9 mg/dL (ref 8.9–10.3)
Calcium: 9.3 mg/dL (ref 8.9–10.3)
Chloride: 109 mmol/L (ref 98–111)
Chloride: 107 mmol/L (ref 98–111)
Chloride: 104 mmol/L (ref 98–111)
Chloride: 101 mmol/L (ref 98–111)
Creatinine, Ser: 1.03 mg/dL (ref 0.61–1.24)
Creatinine, Ser: 1.11 mg/dL (ref 0.61–1.24)
Creatinine, Ser: 1.27 mg/dL — ABNORMAL HIGH (ref 0.61–1.24)
Creatinine, Ser: 1.03 mg/dL (ref 0.61–1.24)
GFR, Estimated: >60 mL/min (ref >60)
GFR, Estimated: >60 mL/min (ref >60)
GFR, Estimated: >60 mL/min (ref >60)
GFR, Estimated: >60 mL/min (ref >60)
Glucose, Bld: 158 mg/dL — ABNORMAL HIGH (ref 70–99)
Glucose, Bld: 226 mg/dL — ABNORMAL HIGH (ref 70–99)
Glucose, Bld: 359 mg/dL — ABNORMAL HIGH (ref 70–99)
Glucose, Bld: 348 mg/dL — ABNORMAL HIGH (ref 70–99)
Potassium: 3.5 mmol/L (ref 3.5–5.1)
Potassium: 3.7 mmol/L (ref 3.5–5.1)
Potassium: 3.9 mmol/L (ref 3.5–5.1)
Potassium: 5.5 mmol/L — ABNORMAL HIGH (ref 3.5–5.1)
Sodium: 139 mmol/L (ref 135–145)
Sodium: 138 mmol/L (ref 135–145)
Sodium: 134 mmol/L — ABNORMAL LOW (ref 135–145)
Sodium: 136 mmol/L (ref 135–145)

## 2021-12-09 LAB — GLUCOSE, CAPILLARY
Glucose-Capillary: 180 mg/dL — ABNORMAL HIGH (ref 70–99)
Glucose-Capillary: 194 mg/dL — ABNORMAL HIGH (ref 70–99)
Glucose-Capillary: 287 mg/dL — ABNORMAL HIGH (ref 70–99)
Glucose-Capillary: 225 mg/dL — ABNORMAL HIGH (ref 70–99)
Glucose-Capillary: 339 mg/dL — ABNORMAL HIGH (ref 70–99)
Glucose-Capillary: 309 mg/dL — ABNORMAL HIGH (ref 70–99)

## 2021-12-09 LAB — HEMOGLOBIN A1C
Hgb A1c MFr Bld: 10.5 % — ABNORMAL HIGH (ref 4.8–5.6)
Mean Plasma Glucose: 254.65 mg/dL

## 2021-12-09 MED ORDER — POTASSIUM CHLORIDE CRYS ER 20 MEQ PO TBCR
40.0000 meq | EXTENDED_RELEASE_TABLET | Freq: Two times a day (BID) | ORAL | Status: AC
  Administered 2021-12-09 – 2021-12-10 (×2): 40 meq via ORAL
  Filled 2021-12-09 (×3): qty 2

## 2021-12-09 MED ORDER — INSULIN GLARGINE-YFGN 100 UNIT/ML ~~LOC~~ SOLN
30.0000 [IU] | Freq: Every day | SUBCUTANEOUS | Status: DC
  Administered 2021-12-09: 30 [IU] via SUBCUTANEOUS
  Filled 2021-12-09 (×3): qty 0.3

## 2021-12-09 MED ORDER — INSULIN ASPART 100 UNIT/ML IJ SOLN
5.0000 [IU] | Freq: Three times a day (TID) | INTRAMUSCULAR | Status: DC
  Administered 2021-12-09 (×2): 5 [IU] via SUBCUTANEOUS

## 2021-12-09 MED ORDER — INSULIN ASPART 100 UNIT/ML IJ SOLN
8.0000 [IU] | Freq: Three times a day (TID) | INTRAMUSCULAR | Status: DC
  Administered 2021-12-09: 8 [IU] via SUBCUTANEOUS

## 2021-12-09 MED ORDER — LACTATED RINGERS IV SOLN: INTRAVENOUS | Status: AC

## 2021-12-09 NOTE — Progress Notes (Signed)
Hospital day#1 Subjective:   Overnight Events: Patient transitioned off Endo tool  Patient feeling better this morning. Denies nausea, vomiting, confusion, abdominal pain. Endorses appetite. Patient has not eaten since admission.  Patient was using Dexcom at home to monitor BG but has not been able to read blood glucose without it. Patient does not have a glucose meter at home.   Objective:  Vital signs in last 24 hours: Vitals:   12/08/21 1720 12/08/21 2029 12/09/21 0447 12/09/21 0744  BP: 118/76 124/69 103/62 131/76  Pulse: 93 89 78 77  Resp:      Temp: 98.4 F (36.9 C) 98.5 F (36.9 C) 98.2 F (36.8 C) 98 F (36.7 C)  TempSrc: Oral Oral Oral Oral  SpO2: 100% 98% 98% 100%  Weight:      Height:       Supplemental O2: Room Air SpO2: 100 % Filed Weights   12/08/21 1211 12/08/21 1718  Weight: 79.4 kg 76.2 kg    Physical Exam:  Constitutional:well-appearing man sitting in bed, in no acute distress HENT: normocephalic atraumatic, moist mucous membranes Neck: supple Cardiovascular: regular rate and rhythm, no m/r/g Pulmonary/Chest: normal work of breathing on room air, lungs clear to auscultation bilaterally. No wheezing Abdominal: normal BS, soft, non-tender, non-distended. **fluid wave. MSK: normal bulk and tone.  Neurological: alert & oriented x 3 Skin: warm and dry Psych: pleasant mood and affect    Intake/Output Summary (Last 24 hours) at 12/09/2021 1103 Last data filed at 12/09/2021 0300 Gross per 24 hour  Intake 1513.48 ml  Output --  Net 1513.48 ml   Net IO Since Admission: 1,513.48 mL [12/09/21 1103]  Pertinent Labs:    Latest Ref Rng & Units 12/09/2021    5:26 AM 12/08/2021    8:27 AM 12/08/2021    8:05 AM  CBC  WBC 4.0 - 10.5 K/uL 7.4   15.8   Hemoglobin 13.0 - 17.0 g/dL 06.2  37.6  28.3   Hematocrit 39.0 - 52.0 % 38.9  50.0  48.1   Platelets 150 - 400 K/uL 230   281        Latest Ref Rng & Units 12/09/2021    9:48 AM 12/09/2021    5:26 AM  12/09/2021    1:29 AM  CMP  Glucose 70 - 99 mg/dL 151  761  607   BUN 6 - 20 mg/dL 11  12  12    Creatinine 0.61 - 1.24 mg/dL  3.71  0.62   Sodium 135 - 145 mmol/L 134  138  139   Potassium 3.5 - 5.1 mmol/L 3.9  3.7  3.5   Chloride 98 - 111 mmol/L 104  107  109   CO2 22 - 32 mmol/L 17  19  21    Calcium 8.9 - 10.3 mg/dL 8.9  8.9  8.9     Imaging: No results found.  Assessment/Plan:   Principal Problem:   Diabetic ketoacidosis (HCC) Active Problems:   DKA (diabetic ketoacidosis) (HCC)   Patient Summary: Ricky Shaw is a 21 y.o. with a pertinent PMH of T1DM admitted for DKA in the setting of insulin pump failure, now stable and improving    T1DM DKA, resolved Patient presented with DKA: BG 535, pH 7.22, AG 21, BHBA 7.85, urine ketone and glucose, likely in the setting of insufficient insulin in the setting of insulin pump failure. Patient was placed on Endotool, now transitioned off of it after bicarb and anion gap improvement. Patient is  now off D5 LR infusion and been transitioned to PO diet. Will continue to monitor blood glucose to establish long acting and short acting insulin coverage prior to discharge as patient will not be able to access an insulin pump prior to discharge. He will also need a glucose meter at discharge.  - Currently on Semglee 20 u - Novolog 5 units TID w meals - Sensitive SSI - Glucose meter at discharge - Follow up with Endocrinology 8/1   Diet: Carb-Modified VTE: Enoxaparin Code: Full  Dispo: Anticipated discharge to Home in 2 days pending clinical improvement.   Morene Crocker, MD Internal Medicine Resident PGY-1 Please contact the on call pager after 5 pm and on weekends at [phone].

## 2021-12-09 NOTE — Progress Notes (Signed)
Pt K on BMP was 5.5 this pm; pm dose of potassium held.  Notified MD via amion.

## 2021-12-10 LAB — BASIC METABOLIC PANEL
Anion gap: 10 (ref 5–15)
BUN: 7 mg/dL (ref 6–20)
CO2: 22 mmol/L (ref 22–32)
Calcium: 8.3 mg/dL — ABNORMAL LOW (ref 8.9–10.3)
Chloride: 102 mmol/L (ref 98–111)
Creatinine, Ser: 0.77 mg/dL (ref 0.61–1.24)
GFR, Estimated: >60 mL/min (ref >60)
Glucose, Bld: 238 mg/dL — ABNORMAL HIGH (ref 70–99)
Potassium: 3.1 mmol/L — ABNORMAL LOW (ref 3.5–5.1)
Sodium: 134 mmol/L — ABNORMAL LOW (ref 135–145)

## 2021-12-10 LAB — GLUCOSE, CAPILLARY
Glucose-Capillary: 196 mg/dL — ABNORMAL HIGH (ref 70–99)
Glucose-Capillary: 202 mg/dL — ABNORMAL HIGH (ref 70–99)
Glucose-Capillary: 44 mg/dL — CL (ref 70–99)
Glucose-Capillary: 48 mg/dL — ABNORMAL LOW (ref 70–99)
Glucose-Capillary: 92 mg/dL (ref 70–99)
Glucose-Capillary: 194 mg/dL — ABNORMAL HIGH (ref 70–99)
Glucose-Capillary: 80 mg/dL (ref 70–99)

## 2021-12-10 MED ORDER — INSULIN GLARGINE-YFGN 100 UNIT/ML ~~LOC~~ SOLN
30.0000 [IU] | Freq: Every day | SUBCUTANEOUS | Status: DC
  Filled 2021-12-10: qty 0.3

## 2021-12-10 MED ORDER — INSULIN ASPART 100 UNIT/ML IJ SOLN: 5.0000 [IU] | Freq: Three times a day (TID) | INTRAMUSCULAR | Status: DC

## 2021-12-10 MED ORDER — INSULIN ASPART 100 UNIT/ML IJ SOLN
10.0000 [IU] | Freq: Three times a day (TID) | INTRAMUSCULAR | Status: DC
  Administered 2021-12-10 (×2): 10 [IU] via SUBCUTANEOUS

## 2021-12-10 MED ORDER — INSULIN ASPART 100 UNIT/ML IJ SOLN
8.0000 [IU] | Freq: Three times a day (TID) | INTRAMUSCULAR | Status: DC
Start: 2021-12-10 — End: 2021-12-10
  Administered 2021-12-10: 8 [IU] via SUBCUTANEOUS

## 2021-12-10 MED ORDER — INSULIN GLARGINE-YFGN 100 UNIT/ML ~~LOC~~ SOLN
35.0000 [IU] | Freq: Every day | SUBCUTANEOUS | Status: DC
  Filled 2021-12-10: qty 0.35

## 2021-12-10 MED ORDER — INSULIN GLARGINE-YFGN 100 UNIT/ML ~~LOC~~ SOLN
40.0000 [IU] | Freq: Every day | SUBCUTANEOUS | Status: DC
  Filled 2021-12-10: qty 0.4

## 2021-12-10 MED ORDER — INSULIN ASPART 100 UNIT/ML IJ SOLN: 8.0000 [IU] | Freq: Three times a day (TID) | INTRAMUSCULAR | Status: DC

## 2021-12-10 MED ORDER — POTASSIUM CHLORIDE CRYS ER 20 MEQ PO TBCR
40.0000 meq | EXTENDED_RELEASE_TABLET | Freq: Two times a day (BID) | ORAL | Status: AC
  Administered 2021-12-10 (×2): 40 meq via ORAL
  Filled 2021-12-10 (×2): qty 2

## 2021-12-10 NOTE — Progress Notes (Addendum)
   Subjective:  Pt is doing well. He had a little bit of a headache yesterday which occurs when his sugars run higher than his normal. He endorses having a good appetite yesterday. He is ready to go home.   Objective:  Vital signs in last 24 hours: Vitals:   12/09/21 1649 12/09/21 2036 12/10/21 0541 12/10/21 0545  BP: 134/90 132/74  123/73  Pulse: 66 66  (!) 128  Resp:  16  16  Temp: 98.1 F (36.7 C) 98.3 F (36.8 C)  97.8 F (36.6 C)  TempSrc: Oral Oral Oral Oral  SpO2: 100% 99%  100%  Weight:      Height:       Weight change:   Intake/Output Summary (Last 24 hours) at 12/10/2021 0756 Last data filed at 12/10/2021 0600 Gross per 24 hour  Intake 2319.79 ml  Output --  Net 2319.79 ml   Physical Exam: Constitutional:well-appearing man lying in bed, in no acute distress HENT: normocephalic atraumatic, moist mucous membranes Neck: supple Cardiovascular: regular rate and rhythm, no m/r/g Pulmonary/Chest: normal work of breathing on room air, lungs clear to auscultation bilaterally. No wheezing MSK: normal bulk and tone.  Neurological: alert & oriented x 3 Skin: warm and dry Psych: pleasant mood and affect  Assessment/Plan:  Principal Problem:   Diabetic ketoacidosis (HCC) Active Problems:   DKA (diabetic ketoacidosis) (HCC)  Patient Summary: Ricky Shaw is a 21 y.o. with a pertinent PMH of T1DM admitted for DKA in the setting of insulin pump failure, now stable and improving.   T1DM DKA, resolved Patient presented with DKA: BG 535, pH 7.22, AG 21, BHBA 7.85, urine ketone and glucose, likely in the setting of insufficient insulin in the setting of insulin pump failure. Patient was placed on Endotool, transitioned off of it after bicarb and anion gap improvement. Patient transitioned to PO diet, tolerating well. Will continue to monitor blood glucose to establish long acting and short acting insulin coverage prior to discharge as patient will not be able to access  an insulin pump prior to discharge. He will need glucose meter at discharge.  - increased to Semglee 40u qhs - Novolog 10 units TID w meals - Sensitive SSI - Glucometer and insulin regimen (basal, bolus) at discharge - Follow up with Endocrinology 8/1  Hypokalemia Likely 2/2 insulin regimen in the setting of DKA. K 5.5 overnight, likely hemolyzed given he has otherwise remained low-normal. K 3.1 this AM without any intervention, will replete with oral potassium.   Diet: Carb-Modified VTE: Enoxaparin Code: Full   Dispo: Anticipated discharge to Home in 2 days pending clinical improvement.    LOS: 2 days   Kathalene Frames, Medical Student 12/10/2021, 7:56 AM  Attestation for Student Documentation:  I personally was present and performed or re-performed the history, physical exam and medical decision-making activities of this service and have verified that the service and findings are accurately documented in the student's note.  Merrilyn Puma, MD 12/10/2021, 12:37 PM

## 2021-12-11 ENCOUNTER — Other Ambulatory Visit (HOSPITAL_COMMUNITY): Payer: Self-pay

## 2021-12-11 ENCOUNTER — Telehealth (INDEPENDENT_AMBULATORY_CARE_PROVIDER_SITE_OTHER): Payer: Self-pay

## 2021-12-11 ENCOUNTER — Telehealth (INDEPENDENT_AMBULATORY_CARE_PROVIDER_SITE_OTHER): Payer: Self-pay | Admitting: Family

## 2021-12-11 DIAGNOSIS — E876 Hypokalemia: Secondary | ICD-10-CM

## 2021-12-11 DIAGNOSIS — T85614A Breakdown (mechanical) of insulin pump, initial encounter: Secondary | ICD-10-CM | POA: Diagnosis not present

## 2021-12-11 DIAGNOSIS — Z794 Long term (current) use of insulin: Secondary | ICD-10-CM | POA: Diagnosis not present

## 2021-12-11 DIAGNOSIS — E1069 Type 1 diabetes mellitus with other specified complication: Secondary | ICD-10-CM | POA: Diagnosis not present

## 2021-12-11 DIAGNOSIS — E1065 Type 1 diabetes mellitus with hyperglycemia: Secondary | ICD-10-CM

## 2021-12-11 LAB — GLUCOSE, CAPILLARY
Glucose-Capillary: 50 mg/dL — ABNORMAL LOW (ref 70–99)
Glucose-Capillary: 86 mg/dL (ref 70–99)
Glucose-Capillary: 355 mg/dL — ABNORMAL HIGH (ref 70–99)
Glucose-Capillary: 114 mg/dL — ABNORMAL HIGH (ref 70–99)

## 2021-12-11 LAB — BASIC METABOLIC PANEL
Anion gap: 10 (ref 5–15)
BUN: 6 mg/dL (ref 6–20)
CO2: 29 mmol/L (ref 22–32)
Calcium: 9.4 mg/dL (ref 8.9–10.3)
Chloride: 100 mmol/L (ref 98–111)
Creatinine, Ser: 0.79 mg/dL (ref 0.61–1.24)
GFR, Estimated: >60 mL/min (ref >60)
Glucose, Bld: 109 mg/dL — ABNORMAL HIGH (ref 70–99)
Potassium: 3.8 mmol/L (ref 3.5–5.1)
Sodium: 139 mmol/L (ref 135–145)

## 2021-12-11 MED ORDER — DEXCOM G6 TRANSMITTER MISC: 1.0000 | Freq: Every day | 1 refills | Status: DC | PRN

## 2021-12-11 MED ORDER — INSULIN GLARGINE 100 UNIT/ML SOLOSTAR PEN
35.0000 [IU] | PEN_INJECTOR | Freq: Every day | SUBCUTANEOUS | 0 refills | Status: DC
  Filled 2021-12-11: qty 15, 42d supply, fill #0

## 2021-12-11 MED ORDER — INSULIN ASPART 100 UNIT/ML FLEXPEN
10.0000 [IU] | PEN_INJECTOR | Freq: Three times a day (TID) | SUBCUTANEOUS | 11 refills | Status: DC
  Filled 2021-12-11: qty 15, 50d supply, fill #0

## 2021-12-11 MED ORDER — INSULIN PEN NEEDLE 32G X 4 MM MISC
1.0000 | Freq: Three times a day (TID) | 0 refills | Status: DC
  Filled 2021-12-11: qty 100, 25d supply, fill #0

## 2021-12-11 MED ORDER — DEXCOM G6 SENSOR MISC: 1.0000 | 11 refills | Status: DC

## 2021-12-11 NOTE — Discharge Summary (Addendum)
Name: Ricky Shaw MRN: [id number] DOB: 05/11/2001 21 y.o. PCP: [name], MD  Date of Admission: 12/08/2021  7:19 AM Date of Discharge: 12/11/2021 Attending Physician: Mercie Eon, MD  Discharge Diagnosis: 1. Principal Problem:   Diabetic ketoacidosis (HCC) Active Problems:   DKA (diabetic ketoacidosis) (HCC) 2. Hypokalemia    Discharge Medications: Allergies as of 12/11/2021       Reactions   Amoxicillin Hives        Medication List     TAKE these medications    BD Pen Needle Nano U/F 32G X 4 MM Misc Generic drug: Insulin Pen Needle Use 3 (three) times daily.   glucagon 1 MG injection Use for Severe Hypoglycemia, if unresponsive, unable to swallow, unconscious and/or has seizure   insulin lispro 100 UNIT/ML injection Commonly known as: HUMALOG USE 300 UNITS IN INSULIN PUMP EVERY 48 HOURS What changed:  how much to take how to take this when to take this   Lantus SoloStar 100 UNIT/ML Solostar Pen Generic drug: insulin glargine Inject 35 Units into the skin daily. Inject 35 units in the skin ONLY in case of pump failure   NovoLOG FlexPen 100 UNIT/ML FlexPen Generic drug: insulin aspart Inject 10 Units into the skin 3 (three) times daily with meals.        Disposition and follow-up:   Mr.Dannel Z Blecher was discharged from [facility] in Good condition.  At the hospital follow up visit please address:  1.  Diabetic Ketoacidosis  - Education on what to do in the case of pump failure.  - Education on using dexcom or glucometer.   2.  Labs / imaging needed at time of follow-up:  - BMP to assess potassium.   3.  Pending labs/ test needing follow-up:  - N/A   Follow-up Appointments:  Follow-up Information     [facility]. Go on 12/12/2021.   Specialty: Endocrinology Why: 230 pm Contact information: [address] [place], [address] [place]  [address] [phone]                Hospital Course by problem list:  LESHAUN BIEBEL is a 21 y.o. with a pertinent PMH of T1DM admitted for DKA in the setting of insulin pump failure, now stable and improving.   T1DM w/ DKA, resolved Patient presented with DKA: BG 535, pH 7.22, AG 21, BHBA 7.85, urine ketone and glucose, likely in the setting of insufficient insulin due to insulin pump failure. Patient was placed on Endotool, transitioned off of it after bicarb and anion gap improvement. Patient transitioned to PO diet, tolerating well. Pt was able to trouble-shoot pump while inpatient and is comfortable going home on his pump. Will also discharge with prescription for glucometer and back up insulin and needles to use in the setting of pump failure. Follow up with Endocrinology on 8/1.   2. Hypokalemia Low of K+ 3.1 likely 2/2 insulin regimen in the setting of DKA. K 5.5, likely hemolyzed given he has otherwise remained low-normal. Repleted with oral potassium. K+ 3.8 at discharge.   Discharge Exam:   BP 138/89 (BP Location: Left Arm)   Pulse 76   Temp 97.9 F (36.6 C) (Oral)   Resp 19   Ht 5\' 7"  (1.702 m)   Wt 76.2 kg   SpO2 100%   BMI 26.30 kg/m  Discharge exam:   Constitutional: well-appearing, lying in bed, in no acute distress. HENT: normocephalic, atraumatic, moist mucous  membranes. Cardiovascular: regular rate and rhythm, no murmurs, rubs, or gallops.  Pulmonary/Chest: normal work of breathing on room air, lungs clear to auscultation bilaterally. No wheezing, rales, or rhonchi.  MSK: normal bulk and tone.  Neurological: alert & oriented  X 3. Skin: warm and dry. Psych: pleasant mood and affect.   Pertinent Labs, Studies, and Procedures:     Latest Ref Rng & Units 12/09/2021    5:26 AM 12/08/2021    8:27 AM 12/08/2021    8:05 AM  CBC  WBC 4.0 - 10.5 K/uL 7.4   15.8   Hemoglobin 13.0 - 17.0 g/dL 67.0  14.1  03.0   Hematocrit 39.0 - 52.0 % 38.9  50.0  48.1    Platelets 150 - 400 K/uL 230   281        Latest Ref Rng & Units 12/11/2021    4:13 AM 12/10/2021    3:05 AM 12/09/2021    5:31 PM  BMP  Glucose 70 - 99 mg/dL 131  438  887   BUN 6 - 20 mg/dL 6  7  12    Creatinine 0.61 - 1.24 mg/dL  5.79  7.28   Sodium 135 - 145 mmol/L 139  134  136   Potassium 3.5 - 5.1 mmol/L 3.8  3.1  5.5   Chloride 98 - 111 mmol/L 100  102  101   CO2 22 - 32 mmol/L 29  22  18    Calcium 8.9 - 10.3 mg/dL 9.4  8.3  9.3     Discharge Instructions: Discharge Instructions     Call MD for:  difficulty breathing, headache or visual disturbances   Complete by: As directed    Call MD for:  extreme fatigue   Complete by: As directed    Call MD for:  persistant dizziness or light-headedness   Complete by: As directed    Call MD for:  persistant nausea and vomiting   Complete by: As directed    Diet - low sodium heart healthy   Complete by: As directed    Increase activity slowly   Complete by: As directed      Medications:   Please continue to use: - Insulin Lispro (Humalog) 100 unit/ML injection (300 units in insulin pump every 48 hours) for your Type 1 Diabetes.    In the case of severely low blood sugar use: - Glucagon (1 MG injection) if needed for severely low blood sugar.    In the case that your pump fails use: - Insulin Aspart (Novolog) 100 unit/ML FlexPen (inject 10 units into the skin 3 times daily with meals ONLY if your pump fails) for your Type 1 Diabetes. - Insulin Pen Needle 32G x 4 MM (use these to inject your insulin) for your Type 1 Diabetes.  - Insulin Glargine (Lantus) 100 unit/ML Solostar Pen (inject 35 units into the skin daily ONLY if your pump fails) for your Type 1 Diabetes. (These are NOT to be taken in combination with your insulin pump).    - You will be discharged with a new glucometer to check your blood sugar levels.    Follow Up:   Please follow up with your endocrinologist 2.06 NP on 12/12/2021 at 2:45 PM.    Signed: Gretchen Short, MD 12/11/2021, 2:13 PM   Pager: [phone]

## 2021-12-11 NOTE — Plan of Care (Signed)
  Problem: Education: Goal: Knowledge of General Education information will improve Description: Including pain rating scale, medication(s)/side effects and non-pharmacologic comfort measures Outcome: Adequate for Discharge   Problem: Health Behavior/Discharge Planning: Goal: Ability to manage health-related needs will improve Outcome: Adequate for Discharge   Problem: Clinical Measurements: Goal: Ability to maintain clinical measurements within normal limits will improve Outcome: Adequate for Discharge Goal: Will remain free from infection Outcome: Adequate for Discharge Goal: Diagnostic test results will improve Outcome: Adequate for Discharge Goal: Respiratory complications will improve Outcome: Adequate for Discharge Goal: Cardiovascular complication will be avoided Outcome: Adequate for Discharge   Problem: Activity: Goal: Risk for activity intolerance will decrease Outcome: Adequate for Discharge   Problem: Nutrition: Goal: Adequate nutrition will be maintained Outcome: Adequate for Discharge   Problem: Coping: Goal: Level of anxiety will decrease Outcome: Adequate for Discharge   Problem: Elimination: Goal: Will not experience complications related to bowel motility Outcome: Adequate for Discharge Goal: Will not experience complications related to urinary retention Outcome: Adequate for Discharge   Problem: Pain Managment: Goal: General experience of comfort will improve Outcome: Adequate for Discharge   Problem: Safety: Goal: Ability to remain free from injury will improve Outcome: Adequate for Discharge   Problem: Skin Integrity: Goal: Risk for impaired skin integrity will decrease Outcome: Adequate for Discharge   Problem: Education: Goal: Ability to describe self-care measures that may prevent or decrease complications (Diabetes Survival Skills Education) will improve Outcome: Adequate for Discharge Goal: Individualized Educational Video(s) Outcome:  Adequate for Discharge   Problem: Cardiac: Goal: Ability to maintain an adequate cardiac output will improve Outcome: Adequate for Discharge   Problem: Health Behavior/Discharge Planning: Goal: Ability to identify and utilize available resources and services will improve Outcome: Adequate for Discharge Goal: Ability to manage health-related needs will improve Outcome: Adequate for Discharge   Problem: Fluid Volume: Goal: Ability to achieve a balanced intake and output will improve Outcome: Adequate for Discharge   Problem: Metabolic: Goal: Ability to maintain appropriate glucose levels will improve Outcome: Adequate for Discharge   Problem: Nutritional: Goal: Maintenance of adequate nutrition will improve Outcome: Adequate for Discharge Goal: Maintenance of adequate weight for body size and type will improve Outcome: Adequate for Discharge   Problem: Respiratory: Goal: Will regain and/or maintain adequate ventilation Outcome: Adequate for Discharge   Problem: Urinary Elimination: Goal: Ability to achieve and maintain adequate renal perfusion and functioning will improve Outcome: Adequate for Discharge   

## 2021-12-11 NOTE — Progress Notes (Signed)
Discussed with patient earlier this evening on rounding after bedside report regarding his previous concern of needing a new pump a discharge.  Patient stated his pump was working and that he had it on.  Patient showed RN his site on left thigh; pump with insulin and tubing at that time was not attached to site.  Patient stated he had not administered any insulin.  IMTS was notified that pump did work and MD followed up with patient and ordered insulin pump order set.  Patient signed contract and instructed on how to use flowsheet at bedside for carb count intake and bolus coverage.  Pt CBG at HS was 80 and snack provided.  Pt stated no bolus coverage was given and his basal rate was 2.2 units per hour.  Advised patient we would continue check his glucose with hospital glucometer and add a CBG at 0300.  Patient verbalized understanding and will notify nursing of any symptoms of hypoglycemia.

## 2021-12-11 NOTE — Inpatient Diabetes Management (Signed)
Inpatient Diabetes Program Recommendations  AACE/ADA: New Consensus Statement on Inpatient Glycemic Control (2015)  Target Ranges:  Prepandial:   less than 140 mg/dL      Peak postprandial:   less than 180 mg/dL (1-2 hours)      Critically ill patients:  140 - 180 mg/dL   Lab Results  Component Value Date   GLUCAP 114 (H) 12/11/2021   HGBA1C 10.5 (H) 12/08/2021    Review of Glycemic Control  Diabetes history: DM1 Outpatient Diabetes medications: Insulin pump Current orders for Inpatient glycemic control: Insulin pump  HgbA1C - 10.5% 355, 114 mg/dL  Inpatient Diabetes Program Recommendations:    Spoke with pt and GM at bedside. Also spoke with mother on phone. Pt does not have back up insulin supplies if his pump were to malfunction. Has Novolog pen, although no pen needles. Does not have blood glucose meter kit. Pt's Mom states Medicaid would not pay for insulin pen needles or Lantus prescription that was written on 1/30 at Endo [facility].  Blood sugar elevated this morning likely from pt taking off insulin pump because he didn't want to hear alarms. Took pump off and then restarted again. Needs more education with insulin pump, backup for pump malfunctioning, and needs glucose meter kit and insulin pen needles. Also needs Lantus Solostar insulin pen for backup. Stressed importance of f/u with Endo to pt and GM. Educated on SQ insulin (both long-acting and rapid-acting) for pump malfunction and meter for Dexcom malfunction. Answered all questions. Spoke with RN and TOC. To f/u with Frederico Hamman, NP at Endo office.   Thank you. Lorenda Peck, RD, LDN, CDE Inpatient Diabetes Coordinator [phone]

## 2021-12-11 NOTE — Telephone Encounter (Signed)
New rx for Dexcom sensors and transmitters.

## 2021-12-11 NOTE — Telephone Encounter (Signed)
PA has already been sent to pharmacy. Along with new prescriptions.

## 2021-12-11 NOTE — Telephone Encounter (Signed)
  Name of who is calling: [name]  Caller's Relationship to Patient: self  Best contact number: [phone]  Provider they see: Gretchen Short  Reason for call: Dexcom needs authorization to be refilled. Paper work was put in [employer] box/ folder today.      PRESCRIPTION REFILL ONLY  Name of prescription:  Pharmacy:

## 2021-12-11 NOTE — Discharge Instructions (Addendum)
You were hospitalized for Diabetic Ketoacidosis after your insulin pump failed.   Hospital course:  - We treated your Diabetic Ketoacidosis with insulin injections during your hospital stay. You developed low potassium while in the hospital which was likely due to the insulin you received. We corrected your potassium with oral potassium supplements.   Medications:  Please continue to use: - Insulin Lispro (Humalog) 100 unit/ML injection (300 units in insulin pump every 48 hours) for your Type 1 Diabetes.   In the case of severely low blood sugar use: - Glucagon (1 MG injection) if needed for severely low blood sugar.   In the case that your pump fails use: - Insulin Aspart (Novolog) 100 unit/ML FlexPen (inject 10 units into the skin 3 times daily with meals ONLY if your pump fails) for your Type 1 Diabetes. - Insulin Pen Needle 32G x 4 MM (use these to inject your insulin) for your Type 1 Diabetes.  - Insulin Glargine (Lantus) 100 unit/ML Solostar Pen (inject 35 units into the skin daily ONLY if your pump fails) for your Type 1 Diabetes. (These are NOT to be taken in combination with your insulin pump).   - You will be discharged with a new glucometer to check your blood sugar levels.   Follow Up:  Please follow up with your endocrinologist Ricky Short NP on 12/12/2021 at 2:45 PM.

## 2021-12-11 NOTE — Progress Notes (Signed)
Patient and his grandmother were given discharge instructions and stated understanding.

## 2021-12-11 NOTE — Progress Notes (Signed)
Hypoglycemic Event  CBG: 50 @ 0256  Treatment: 8 oz juice/soda  Symptoms: None  Follow-up CBG: Time:0326 CBG Result:86  Possible Reasons for Event: Medication regimen: started insulin pump  Comments/MD notified: pump suspended per pt report    Ricky Shaw, Ricky Shaw

## 2021-12-12 ENCOUNTER — Ambulatory Visit (INDEPENDENT_AMBULATORY_CARE_PROVIDER_SITE_OTHER): Payer: Medicaid Other | Admitting: Family

## 2021-12-12 ENCOUNTER — Telehealth (INDEPENDENT_AMBULATORY_CARE_PROVIDER_SITE_OTHER): Payer: Self-pay

## 2021-12-12 ENCOUNTER — Other Ambulatory Visit (INDEPENDENT_AMBULATORY_CARE_PROVIDER_SITE_OTHER): Payer: Self-pay

## 2021-12-12 ENCOUNTER — Telehealth (INDEPENDENT_AMBULATORY_CARE_PROVIDER_SITE_OTHER): Payer: Self-pay | Admitting: Family

## 2021-12-12 ENCOUNTER — Encounter (INDEPENDENT_AMBULATORY_CARE_PROVIDER_SITE_OTHER): Payer: Self-pay | Admitting: Family

## 2021-12-12 VITALS — BP 118/76 | HR 87 | Wt 174.4 lb

## 2021-12-12 DIAGNOSIS — E1065 Type 1 diabetes mellitus with hyperglycemia: Secondary | ICD-10-CM | POA: Diagnosis not present

## 2021-12-12 DIAGNOSIS — Z9641 Presence of insulin pump (external) (internal): Secondary | ICD-10-CM | POA: Diagnosis not present

## 2021-12-12 DIAGNOSIS — Z91199 Patient's noncompliance with other medical treatment and regimen due to unspecified reason: Secondary | ICD-10-CM | POA: Diagnosis not present

## 2021-12-12 LAB — POCT GLUCOSE (DEVICE FOR HOME USE): POC Glucose: 105 mg/dl — AB (ref 70–99)

## 2021-12-12 MED ORDER — DEXCOM G6 SENSOR MISC: 1.0000 | 11 refills | Status: DC

## 2021-12-12 MED ORDER — INSULIN PEN NEEDLE 32G X 4 MM MISC
1.0000 | Freq: Three times a day (TID) | 3 refills | Status: AC
Start: 2021-12-12 — End: ?

## 2021-12-12 MED ORDER — DEXCOM G6 TRANSMITTER MISC: 3 refills | Status: DC

## 2021-12-12 MED ORDER — INSULIN LISPRO (1 UNIT DIAL) 100 UNIT/ML (KWIKPEN): PEN_INJECTOR | SUBCUTANEOUS | 11 refills | Status: DC

## 2021-12-12 NOTE — Telephone Encounter (Signed)
Called pharmacy. Patient cant pick up transmitter until 8-19. Sensor went through. We do not have extra sensors or transmitters.

## 2021-12-12 NOTE — Progress Notes (Signed)
Pediatric Endocrinology Diabetes Consultation Follow-up Visit  Ricky Shaw 04-01-2001 [id number]  Chief Complaint: Follow-up type 1 diabetes   [name], MD   HPI: Ricky Shaw  is a 21 y.o. male presenting for follow-up of type 1 diabetes. he is accompanied to this visit by his grandmother  .  1. Cas was admitted to the pediatric ward at [facility] on 11/13/11 for the above chief complaint. He had had polyuria and polydipsia. His initial BG was 340. Initial serum glucose was 354. Serum CO2 was 18. Venous pH was 7.367.  Hemoglobin A1c was 13.3%. Serum C-peptide was 0.68 (normal 0.80-390). Urine glucose was >1000 and urine ketones were >80. Anti-islet cell antibody was 40 (normal <5). Insulin antibodies were 5.0 (normal <0.4).Anti-GAD antibody was negative at <1.0. TSH was 2.344, free T4 1.36, free T3 3.5.  Tissue transglutaminase IgA was 2.2 (normal <20). Gliadin IgA antibodies were 4.9 (normal <20). He was started on Lantus as a basal insulin and Novolog as a bolus insulin at mealtimes, and also at bedtime and 2 AM if needed. After receiving IV fluids, insulins, and DM education, he was discharged on 11/17/11.   2. Since last visit to [facility] on 05/2021, he was recently in hospital with DKA on 12/08/2021 due to insulin pump site failure. He reports his site stopped giving him insulin in the middle of the night so he changed the site but that site failed as well. When he woke up in the morning he was nauseous and vomiting. He did not have back up insulin pens because insurance "denied the pens".   He is currently working at [employer] which he feels is bad for his health because he is frequently eating fast food while at work. He feels like he was doing ok with his diabetes care prior to hospitalization. He forgets to bolus "some of the time". Estimates he eats between 50-70 grams of carbs per meal. Has not been wearing dexcom for a few days because he ran out of sensors and needed a PA.  Hypoglycemia has been rare.    Insulin regimen: Tsim insulin pump  - Basal Rates 12AM 1.85  3am 1.95  6am 2.0   8am 2.20   9pm  2.20    50.6 units per day   Insulin to Carbohydrate Ratio 12AM 10  6am 4  11am 3   4pm 4        Insulin Sensitivity Factor 12AM 24  3am 24  6am 24  7am 20  8am 9pm 11pm _0 Target Blood Glucose 12AM 150  6am 110  9pm 150          Hypoglycemia: Is not consistently able to feel low blood sugars.practice. No glucagon needed.  Insulin pump download:  - Insulin pump shows he is only wearing Dexcom CGM about 58% of the time. When wearing CGm his blood sugars look good overall. He is not consistently bolusing which leads to blood sugar spikes.   Med-alert ID: Bracelet  Injection sites: legs, abdomen  Annual labs due: 2024 Ophthalmology due: 2023. Discussed he is overdue. Needs to have exam.     3. ROS: Greater than 10 systems reviewed with pertinent positives listed in HPI, otherwise neg. Constitutional: Sleeping well. Weight stable.  Eyes: No changes in vision. No blurry vision.  Ears/Nose/Mouth/Throat: No difficulty swallowing. Denies neck pain  Cardiovascular: No palpitations. Denies chest pain  Respiratory: No increased work of breathing. Denies SOB   Neurologic: Normal sensation, no tremor GI:  No abdominal pain. No constipation/diarrhea.  Endocrine: No polydipsia.  No hyperpigmentation Psychiatric: Normal affect today. Denies depression and anxiety   Past Medical History:   Past Medical History:  Diagnosis Date   Diabetes mellitus 11/14/2011   Diabetes mellitus without complication ([place])    Phreesia 03/03/2020    Medications:  Outpatient Encounter Medications as of 12/12/2021  Medication Sig Note   insulin lispro (HUMALOG) 100 UNIT/ML injection USE 300 UNITS IN INSULIN PUMP EVERY 48 HOURS (Patient taking differently: Inject 300 Units into the skin See admin instructions. Use 300 units in insulin pump every 48  hours)    [DISCONTINUED] Continuous Blood Gluc Sensor (DEXCOM G6 SENSOR) MISC 1 Device by Does not apply route continuous.    [DISCONTINUED] Continuous Blood Gluc Transmit (DEXCOM G6 TRANSMITTER) MISC 1 kit by Does not apply route daily as needed.    glucagon 1 MG injection Use for Severe Hypoglycemia, if unresponsive, unable to swallow, unconscious and/or has seizure (Patient not taking: Reported on 12/12/2021) 12/08/2021: PRN has not used   insulin aspart (NOVOLOG) 100 UNIT/ML FlexPen Inject 10 Units into the skin 3 (three) times daily with meals. ONLY IN CASE OF PUMP FAILURE (Patient not taking: Reported on 12/12/2021)    insulin glargine (LANTUS) 100 UNIT/ML Solostar Pen Inject 35 Units into the skin daily. Inject 35 units in the skin ONLY in case of pump failure (Patient not taking: Reported on 12/12/2021)    Insulin Pen Needle 32G X 4 MM MISC 1 applicator by Does not apply route 3 (three) times daily. Inject up to 5 x per day.    [DISCONTINUED] Insulin Pen Needle 32G X 4 MM MISC Use 3 (three) times daily. (Patient not taking: Reported on 12/12/2021)    No facility-administered encounter medications on file as of 12/12/2021.    Allergies: Allergies  Allergen Reactions   Amoxicillin Hives    Surgical History: Past Surgical History:  Procedure Laterality Date   nasal cauterization     NASAL HEMORRHAGE CONTROL  05/14/2006    Family History:  Family History  Problem Relation Age of Onset   Asthma Maternal Aunt    Cancer Maternal Grandfather    Diabetes Paternal Grandmother    Hypertension Other    Thyroid disease Neg Hx       Social History: Lives with: mother  Graduated. Unemployed currently   Physical Exam:  Vitals:   12/12/21 1402  BP: 118/76  Pulse: 87  Weight: 174 lb 6.4 oz (79.1 kg)      BP 118/76   Pulse 87   Wt 174 lb 6.4 oz (79.1 kg)   BMI 27.31 kg/m  Body mass index: body mass index is 27.31 kg/m. Blood pressure %iles are not available for patients who are 18  years or older.  Ht Readings from Last 3 Encounters:  12/08/21 _0  (1.702 m) (18 %, Z= -0.92)*  03/21/21 _1  (1.651 m) (6 %, Z= -1.59)*  02/07/21 5' 5.35" (1.66 m) (7 %, Z= -1.46)*   * Growth percentiles are based on CDC (Boys, 2-20 Years) data.   Wt Readings from Last 3 Encounters:  12/12/21 174 lb 6.4 oz (79.1 kg) (76 %, Z= 0.71)*  12/08/21 167 lb 14.4 oz (76.2 kg) (69 %, Z= 0.50)*  06/12/21 182 lb (82.6 kg) (84 %, Z= 1.00)*   * Growth percentiles are based on CDC (Boys, 2-20 Years) data.    PHYSICAL EXAM: General: Well developed, well nourished male in no acute distress.   Head:  Normocephalic, atraumatic.   Eyes:  Pupils equal and round. EOMI.  Sclera white.  No eye drainage.   Ears/Nose/Mouth/Throat: Nares patent, no nasal drainage.  Normal dentition, mucous membranes moist.  Neck: supple, no cervical lymphadenopathy, no thyromegaly Cardiovascular: regular rate, normal S1/S2, no murmurs Respiratory: No increased work of breathing.  Lungs clear to auscultation bilaterally.  No wheezes. Abdomen: soft, nontender, nondistended. Normal bowel sounds.  No appreciable masses  Extremities: warm, well perfused, cap refill < 2 sec.   Musculoskeletal: Normal muscle mass.  Normal strength Skin: warm, dry.  No rash or lesions. Neurologic: alert and oriented, normal speech, no tremor   Labs:  Results for orders placed or performed in visit on 12/12/21  POCT Glucose (Device for Home Use)  Result Value Ref Range   Glucose Fasting, POC     POC Glucose 105 (A) 70 - 99 mg/dl    Assessment/Plan: Izyk is a 21 y.o. male with uncontrolled type 1 diabetes using Tandem Tslim insulin pump. Ty has been noncompliant with diabetes care since his last visit which appears to be due to adjustment reaction to adult life. He is not consisently wearing CGM and frequently misses boluses at meals which lead to prolonged period of hyperglycemia. His pump report shows that when he does bolus, his  blood sugars primarily stay in target range.   1-4. DM w/o complication type I, uncontrolled (HCC)/Hyperglycemia/hypoglycemia unawareness/elevated a1c  - Reviewed insulin pump and CGM download. Discussed trends and patterns.  - Rotate pump sites to prevent scar tissue.  - bolus 15 minutes prior to eating to limit blood sugar spikes.  - Reviewed carb counting and importance of accurate carb counting.  - Discussed signs and symptoms of hypoglycemia. Always have glucose available.  - POCT glucose  - Reviewed growth chart.  - Discussed pump site failure plan. Advised if site failure occurs, give correction via Humalog pen, then change site.  - Scripts sent for insulin pen needles, humalog pens (insurance denied Novolog). PA was done for [facility] sensors.   4. Adjustment reaction/noncompliance  - Discussed concerns that are negatively impacting care  - Encouraged to contact me between visits as needed for advise/insulin dose titration.  - Discussed possible complications of uncontrolled T1DM.   5. Insulin pump Titration /Insulin pump in place.  No changes today.   Follow-up:  4 weeks.   LOS: >40  spent today reviewing the medical chart, counseling the patient/family, and documenting today's visit.     When a patient is on insulin, intensive monitoring of blood glucose levels is necessary to avoid hyperglycemia and hypoglycemia. Severe hyperglycemia/hypoglycemia can lead to hospital admissions and be life threatening.     Hermenia Bers,  FNP-C  Pediatric Specialist  [address] [place]  [place], [address]  Tele: [phone]

## 2021-12-12 NOTE — Telephone Encounter (Signed)
error 

## 2021-12-12 NOTE — Patient Instructions (Signed)
It was a pleasure seeing you in clinic today. Please do not hesitate to contact me if you have questions or concerns.   Please sign up for MyChart. This is a communication tool that allows you to send an email directly to me. This can be used for questions, prescriptions and blood sugar reports. We will also release labs to you with instructions on MyChart. Please do not use MyChart if you need immediate or emergency assistance. Ask our wonderful front office staff if you need assistance.   Follow up in 1 month.

## 2022-01-09 ENCOUNTER — Ambulatory Visit (INDEPENDENT_AMBULATORY_CARE_PROVIDER_SITE_OTHER): Payer: Medicaid Other | Admitting: Family

## 2022-01-09 ENCOUNTER — Encounter (INDEPENDENT_AMBULATORY_CARE_PROVIDER_SITE_OTHER): Payer: Self-pay | Admitting: Family

## 2022-01-09 VITALS — BP 112/64 | HR 98 | Wt 167.4 lb

## 2022-01-09 DIAGNOSIS — Z794 Long term (current) use of insulin: Secondary | ICD-10-CM

## 2022-01-09 DIAGNOSIS — R739 Hyperglycemia, unspecified: Secondary | ICD-10-CM

## 2022-01-09 DIAGNOSIS — Z91199 Patient's noncompliance with other medical treatment and regimen due to unspecified reason: Secondary | ICD-10-CM | POA: Diagnosis not present

## 2022-01-09 DIAGNOSIS — E1065 Type 1 diabetes mellitus with hyperglycemia: Secondary | ICD-10-CM | POA: Diagnosis not present

## 2022-01-09 DIAGNOSIS — R824 Acetonuria: Secondary | ICD-10-CM

## 2022-01-09 LAB — POCT URINALYSIS DIPSTICK

## 2022-01-09 LAB — POCT GLUCOSE (DEVICE FOR HOME USE): POC Glucose: 456 mg/dl — AB (ref 70–99)

## 2022-01-09 MED ORDER — INSULIN LISPRO 100 UNIT/ML IJ SOLN: INTRAMUSCULAR | 5 refills | Status: DC

## 2022-01-09 NOTE — Patient Instructions (Addendum)
It was a pleasure seeing you in clinic today. Please do not hesitate to contact me if you have questions or concerns.   Please sign up for MyChart. This is a communication tool that allows you to send an email directly to me. This can be used for questions, prescriptions and blood sugar reports. We will also release labs to you with instructions on MyChart. Please do not use MyChart if you need immediate or emergency assistance. Ask our wonderful front office staff if you need assistance.   - Until Ketones are clear, follow this plan   1. Give Correction novolog/humalog every 2 hours per your plan   2. Drink at least 20 oz of water per hour   3. Check ketones every 3 hours   - If you start having nausea, vomiting, change in breathing or change in level of consciousness--> go to ER.   If not wearing pump:  - 45 units of Lantus  - Novolog/Humalog: 1 unit for 5 grams of carbs           - 1 unit for very 30 points over 120

## 2022-01-09 NOTE — Progress Notes (Signed)
Pediatric Endocrinology Diabetes Consultation Follow-up Visit  Ricky Shaw [birth year] [id number]  Chief Complaint: Follow-up type 1 diabetes   Ricky Edward, MD   HPI: Ricky Shaw  is a 21 y.o. male presenting for follow-up of type 1 diabetes. he is accompanied to this visit by his grandmother  .  1. Ricky Shaw was admitted to the pediatric ward at [facility] on 11/13/11 for the above chief complaint. He had had polyuria and polydipsia. His initial BG was 340. Initial serum glucose was 354. Serum CO2 was 18. Venous pH was 7.367.  Hemoglobin A1c was 13.3%. Serum C-peptide was 0.68 (normal 0.80-390). Urine glucose was >1000 and urine ketones were >80. Anti-islet cell antibody was 40 (normal <5). Insulin antibodies were 5.0 (normal <0.4).Anti-GAD antibody was negative at <1.0. TSH was 2.344, free T4 1.36, free T3 3.5.  Tissue transglutaminase IgA was 2.2 (normal <20). Gliadin IgA antibodies were 4.9 (normal <20). He was started on Lantus as a basal insulin and Novolog as a bolus insulin at mealtimes, and also at bedtime and 2 AM if needed. After receiving IV fluids, insulins, and DM education, he was discharged on 11/17/11.   2. Since last visit to [facility] on 12/2021, he has been well since his last visit.   He is not currently wearing his insulin pump, he reports that he ran out of Humalog vials. He ran out about 2 weeks ago. He is giving Lantus 10-15 units at night. He estimates taking 12-15 units of NOvolog per meal. He denies missing injections although he is severely hyperglycemia and has ketonuria in clinic today. He denies nausea, abdominal pain and vomiting.   He reports when he was on his pump his blood sugars were better. He plans to pick up insulin refills today.     Insulin regimen: Tsim insulin pump  - Basal Rates 12AM 1.85  3am 1.95  6am 2.0   8am 2.20   9pm  2.20    50.6 units per day   Insulin to Carbohydrate Ratio 12AM 10  6am 4  11am 3   4pm 4        Insulin  Sensitivity Factor 12AM 24  3am 24  6am 24  7am 20  8am 9pm 11pm 20 20 24    Target Blood Glucose 12AM 150  6am 110  9pm 150          Hypoglycemia: Is not consistently able to feel low blood sugars.practice. No glucagon needed.  Insulin pump download:   Med-alert ID: Bracelet  Injection sites: legs, abdomen  Annual labs due: 2024 Ophthalmology due: 2023. Discussed he is overdue. Needs to have exam.     3. ROS: Greater than 10 systems reviewed with pertinent positives listed in HPI, otherwise neg. Constitutional: Sleeping well. Weight stable.  Eyes: No changes in vision. No blurry vision.  Ears/Nose/Mouth/Throat: No difficulty swallowing. Denies neck pain  Cardiovascular: No palpitations. Denies chest pain  Respiratory: No increased work of breathing. Denies SOB   Neurologic: Normal sensation, no tremor GI: No abdominal pain. No constipation/diarrhea.  Endocrine: No polydipsia.  No hyperpigmentation Psychiatric: Normal affect today. Denies depression and anxiety   Past Medical History:   Past Medical History:  Diagnosis Date   Diabetes mellitus 11/14/2011   Diabetes mellitus without complication (HCC)    Phreesia 03/03/2020    Medications:  Outpatient Encounter Medications as of 01/09/2022  Medication Sig Note   Continuous Blood Gluc Sensor (DEXCOM G6 SENSOR) MISC 1 Device by Does not apply route continuous.  Continuous Blood Gluc Transmit (DEXCOM G6 TRANSMITTER) MISC Change every 90 days    insulin glargine (LANTUS) 100 UNIT/ML Solostar Pen Inject 35 Units into the skin daily. Inject 35 units in the skin ONLY in case of pump failure    [DISCONTINUED] insulin lispro (HUMALOG KWIKPEN) 100 UNIT/ML KwikPen Use incase of pump fail    [DISCONTINUED] insulin lispro (HUMALOG) 100 UNIT/ML injection USE 300 UNITS IN INSULIN PUMP EVERY 48 HOURS (Patient taking differently: Inject 300 Units into the skin See admin instructions. Use 300 units in insulin pump every 48 hours)     glucagon 1 MG injection Use for Severe Hypoglycemia, if unresponsive, unable to swallow, unconscious and/or has seizure (Patient not taking: Reported on 12/12/2021) 12/08/2021: PRN has not used   insulin aspart (NOVOLOG) 100 UNIT/ML FlexPen Inject 10 Units into the skin 3 (three) times daily with meals. ONLY IN CASE OF PUMP FAILURE (Patient not taking: Reported on 12/12/2021)    insulin lispro (HUMALOG) 100 UNIT/ML injection USE 300 UNITS IN INSULIN PUMP EVERY 48 HOURS    Insulin Pen Needle 32G X 4 MM MISC 1 applicator by Does not apply route 3 (three) times daily. Inject up to 5 x per day. (Patient not taking: Reported on 01/09/2022)    No facility-administered encounter medications on file as of 01/09/2022.    Allergies: Allergies  Allergen Reactions   Amoxicillin Hives    Surgical History: Past Surgical History:  Procedure Laterality Date   nasal cauterization     NASAL HEMORRHAGE CONTROL  05/14/2006    Family History:  Family History  Problem Relation Age of Onset   Asthma Maternal Aunt    Cancer Maternal Grandfather    Diabetes Paternal Grandmother    Hypertension Other    Thyroid disease Neg Hx       Social History: Lives with: mother  Graduated. Unemployed currently   Physical Exam:  Vitals:   01/09/22 1118  BP: 112/64  Pulse: 98  Weight: 167 lb 6.4 oz (75.9 kg)       BP 112/64   Pulse 98   Wt 167 lb 6.4 oz (75.9 kg)   BMI 26.22 kg/m  Body mass index: body mass index is 26.22 kg/m. Blood pressure %iles are not available for patients who are 18 years or older.  Ht Readings from Last 3 Encounters:  12/08/21 5\' 7"  (1.702 m) (18 %, Z= -0.92)*  03/21/21 5\' 5"  (1.651 m) (6 %, Z= -1.59)*  02/07/21 5' 5.35" (1.66 m) (7 %, Z= -1.46)*   * Growth percentiles are based on CDC (Boys, 2-20 Years) data.   Wt Readings from Last 3 Encounters:  01/09/22 167 lb 6.4 oz (75.9 kg) (68 %, Z= 0.47)*  12/12/21 174 lb 6.4 oz (79.1 kg) (76 %, Z= 0.71)*  12/08/21 167 lb 14.4  oz (76.2 kg) (69 %, Z= 0.50)*   * Growth percentiles are based on CDC (Boys, 2-20 Years) data.    PHYSICAL EXAM: General: Well developed, well nourished male in no acute distress. Head: Normocephalic, atraumatic.   Eyes:  Pupils equal and round. EOMI.  Sclera white.  No eye drainage.   Ears/Nose/Mouth/Throat: Nares patent, no nasal drainage.  Normal dentition, mucous membranes moist.  Neck: supple, no cervical lymphadenopathy, no thyromegaly Cardiovascular: regular rate, normal S1/S2, no murmurs Respiratory: No increased work of breathing.  Lungs clear to auscultation bilaterally.  No wheezes. Abdomen: soft, nontender, nondistended. Normal bowel sounds.  No appreciable masses  Extremities: warm, well perfused, cap refill <  2 sec.   Musculoskeletal: Normal muscle mass.  Normal strength Skin: warm, dry.  No rash or lesions. Neurologic: alert and oriented, normal speech, no tremor   Labs:  Results for orders placed or performed in visit on 01/09/22  POCT Glucose (Device for Home Use)  Result Value Ref Range   Glucose Fasting, POC     POC Glucose 456 (A) 70 - 99 mg/dl  POCT urinalysis dipstick  Result Value Ref Range   Color, UA     Clarity, UA     Glucose, UA     Bilirubin, UA     Ketones, UA large    Spec Grav, UA     Blood, UA     pH, UA     Protein, UA     Urobilinogen, UA     Nitrite, UA     Leukocytes, UA     Appearance     Odor      Assessment/Plan: Ricky Shaw is a 21 y.o. male with uncontrolled type 1 diabetes using Tandem Tslim insulin pump. Noncompliance remains a significant issues with Ricky Shaw. He has been off insulin pump for over 2 weeks and significantly under dosing Lantus while on MDI. He is hyperglycemic with ketonuria in clinic today.   1-2. DM w/o complication type I, uncontrolled (HCC)/Hyperglycemia - Reviewed  CGM download. Discussed trends and patterns.  - Rotate pump sites to prevent scar tissue.  - bolus 15 minutes prior to eating to limit blood sugar  spikes.  - Reviewed carb counting and importance of accurate carb counting.  - Discussed signs and symptoms of hypoglycemia. Always have glucose available.  - POCT glucose and hemoglobin A1c  - Reviewed growth chart.  - Discussed and reviewed insulin pump failure plan. Advised that he should always contact clinic if he is having issues with refills. Gave samples of Fiasp vials today.    3. Adjustment reaction/noncompliance  - Stressed the need for rapid improvement in diabetes care to prevent diabetes related complications.  - Will continue with close follow up as he feels this will help with improvements.    4. Insulin pump Titration /Insulin pump in place.  - Back up plan discussed in detail for pump failure  - Lantus 45 units  - Novolog ICR 1:5, ISF 1:30 >120 day and >180 night.   5. Ketonuria - Until Ketones are clear, follow this plan   1. Give Correction novolog/humalog every 2 hours per your plan   2. Drink at least 20 oz of water per hour   3. Check ketones every 3 hours   - If you start having nausea, vomiting, change in breathing or change in level of consciousness--> go to ER.  Follow-up:  2 weeks.   LOS: >40  spent today reviewing the medical chart, counseling the patient/family, and documenting today's visit.   When a patient is on insulin, intensive monitoring of blood glucose levels is necessary to avoid hyperglycemia and hypoglycemia. Severe hyperglycemia/hypoglycemia can lead to hospital admissions and be life threatening.     Gretchen Short,  FNP-C  Pediatric Specialist  [address]  [place], [address]  Tele: [phone]

## 2022-01-10 ENCOUNTER — Encounter (INDEPENDENT_AMBULATORY_CARE_PROVIDER_SITE_OTHER): Payer: Self-pay

## 2022-01-24 ENCOUNTER — Ambulatory Visit (INDEPENDENT_AMBULATORY_CARE_PROVIDER_SITE_OTHER): Payer: Medicaid Other | Admitting: Family

## 2022-01-24 ENCOUNTER — Encounter (INDEPENDENT_AMBULATORY_CARE_PROVIDER_SITE_OTHER): Payer: Self-pay | Admitting: Family

## 2022-01-24 VITALS — BP 122/80 | HR 102 | Wt 172.6 lb

## 2022-01-24 DIAGNOSIS — Z4681 Encounter for fitting and adjustment of insulin pump: Secondary | ICD-10-CM | POA: Diagnosis not present

## 2022-01-24 DIAGNOSIS — Z91199 Patient's noncompliance with other medical treatment and regimen due to unspecified reason: Secondary | ICD-10-CM | POA: Diagnosis not present

## 2022-01-24 DIAGNOSIS — R739 Hyperglycemia, unspecified: Secondary | ICD-10-CM

## 2022-01-24 DIAGNOSIS — E1065 Type 1 diabetes mellitus with hyperglycemia: Secondary | ICD-10-CM | POA: Diagnosis not present

## 2022-01-24 LAB — POCT GLUCOSE (DEVICE FOR HOME USE): POC Glucose: 141 mg/dl — AB (ref 70–99)

## 2022-01-24 NOTE — Progress Notes (Signed)
Pediatric Endocrinology Diabetes Consultation Follow-up Visit  Ricky Shaw [birth year] [id number]  Chief Complaint: Follow-up type 1 diabetes   Ricky Edward, MD   HPI: Ricky Shaw  is a 21 y.o. male presenting for follow-up of type 1 diabetes. he is accompanied to this visit by his grandmother  .  1. Ricky Shaw was admitted to the pediatric ward at [facility] on 11/13/11 for the above chief complaint. He had had polyuria and polydipsia. His initial BG was 340. Initial serum glucose was 354. Serum CO2 was 18. Venous pH was 7.367.  Hemoglobin A1c was 13.3%. Serum C-peptide was 0.68 (normal 0.80-390). Urine glucose was >1000 and urine ketones were >80. Anti-islet cell antibody was 40 (normal <5). Insulin antibodies were 5.0 (normal <0.4).Anti-GAD antibody was negative at <1.0. TSH was 2.344, free T4 1.36, free T3 3.5.  Tissue transglutaminase IgA was 2.2 (normal <20). Gliadin IgA antibodies were 4.9 (normal <20). He was started on Lantus as a basal insulin and Novolog as a bolus insulin at mealtimes, and also at bedtime and 2 AM if needed. After receiving IV fluids, insulins, and DM education, he was discharged on 11/17/11.   2. Since last visit to [facility] on 12/2021, he has been well since his last visit.   He started wearing his Tslim insulin pump right after his last visit, it has been working well for him. He states that "things have been way better" now that he has both his pump and CGM back on. He has not had nearly as much hyperglycemia. Hypoglycemia has occurred a few times but none severe or requiring glucagon. He reports being afraid of low blood sugars because he does not always have juice or other glucose around if he goes low. He does have glucagon.    Insulin regimen: Tsim insulin pump  - Basal Rates 12AM 1.85  3am 1.95  6am 2.0   8am 2.20   9pm  2.20    50.6 units per day   Insulin to Carbohydrate Ratio 12AM 3AM 10 10   6am 4  8am 3   9pm 4        Insulin Sensitivity  Factor 12AM 24  3am 24  6am 24     8am 9pm 20 20   Target Blood Glucose 12AM 150  6am 110  9pm 150          Hypoglycemia: Is consistently able to feel low blood sugars.practice. No glucagon needed.  Insulin pump download:  Med-alert ID: Bracelet  Injection sites: legs, abdomen  Annual labs due: 2024 Ophthalmology due: 2023. Discussed he is overdue. Needs to have exam.     3. ROS: Greater than 10 systems reviewed with pertinent positives listed in HPI, otherwise neg. Constitutional: Sleeping well. 5 lbs weight gain  Eyes: No changes in vision. No blurry vision.  Ears/Nose/Mouth/Throat: No difficulty swallowing. Denies neck pain  Cardiovascular: No palpitations. Denies chest pain  Respiratory: No increased work of breathing. Denies SOB   Neurologic: Normal sensation, no tremor GI: No abdominal pain. No constipation/diarrhea.  Endocrine: No polydipsia.  No hyperpigmentation Psychiatric: Normal affect today. Denies depression and anxiety   Past Medical History:   Past Medical History:  Diagnosis Date   Diabetes mellitus 11/14/2011   Diabetes mellitus without complication (HCC)    Phreesia 03/03/2020    Medications:  Outpatient Encounter Medications as of 01/24/2022  Medication Sig Note   Continuous Blood Gluc Sensor (DEXCOM G6 SENSOR) MISC 1 Device by Does not apply route continuous.  Continuous Blood Gluc Transmit (DEXCOM G6 TRANSMITTER) MISC Change every 90 days    insulin lispro (HUMALOG) 100 UNIT/ML injection USE 300 UNITS IN INSULIN PUMP EVERY 48 HOURS    glucagon 1 MG injection Use for Severe Hypoglycemia, if unresponsive, unable to swallow, unconscious and/or has seizure (Patient not taking: Reported on 12/12/2021) 12/08/2021: PRN has not used   insulin aspart (NOVOLOG) 100 UNIT/ML FlexPen Inject 10 Units into the skin 3 (three) times daily with meals. ONLY IN CASE OF PUMP FAILURE (Patient not taking: Reported on 12/12/2021)    insulin glargine (LANTUS) 100 UNIT/ML  Solostar Pen Inject 35 Units into the skin daily. Inject 35 units in the skin ONLY in case of pump failure (Patient not taking: Reported on 01/24/2022)    Insulin Pen Needle 32G X 4 MM MISC 1 applicator by Does not apply route 3 (three) times daily. Inject up to 5 x per day. (Patient not taking: Reported on 01/09/2022)    No facility-administered encounter medications on file as of 01/24/2022.    Allergies: Allergies  Allergen Reactions   Amoxicillin Hives    Surgical History: Past Surgical History:  Procedure Laterality Date   nasal cauterization     NASAL HEMORRHAGE CONTROL  05/14/2006    Family History:  Family History  Problem Relation Age of Onset   Asthma Maternal Aunt    Cancer Maternal Grandfather    Diabetes Paternal Grandmother    Hypertension Other    Thyroid disease Neg Hx       Social History: Lives with: mother  Graduated. Unemployed currently   Physical Exam:  Vitals:   01/24/22 1119  BP: 122/80  Pulse: (!) 102  Weight: 172 lb 9.6 oz (78.3 kg)        BP 122/80   Pulse (!) 102   Wt 172 lb 9.6 oz (78.3 kg)   BMI 27.03 kg/m  Body mass index: body mass index is 27.03 kg/m. Blood pressure %iles are not available for patients who are 18 years or older.  Ht Readings from Last 3 Encounters:  12/08/21 5\' 7"  (1.702 m) (18 %, Z= -0.92)*  03/21/21 5\' 5"  (1.651 m) (6 %, Z= -1.59)*  02/07/21 5' 5.35" (1.66 m) (7 %, Z= -1.46)*   * Growth percentiles are based on CDC (Boys, 2-20 Years) data.   Wt Readings from Last 3 Encounters:  01/24/22 172 lb 9.6 oz (78.3 kg) (74 %, Z= 0.64)*  01/09/22 167 lb 6.4 oz (75.9 kg) (68 %, Z= 0.47)*  12/12/21 174 lb 6.4 oz (79.1 kg) (76 %, Z= 0.71)*   * Growth percentiles are based on CDC (Boys, 2-20 Years) data.    PHYSICAL EXAM: General: Well developed, well nourished male in no acute distress.  Head: Normocephalic, atraumatic.   Eyes:  Pupils equal and round. EOMI.  Sclera white.  No eye drainage.    Ears/Nose/Mouth/Throat: Nares patent, no nasal drainage.  Normal dentition, mucous membranes moist.  Neck: supple, no cervical lymphadenopathy, no thyromegaly Cardiovascular: regular rate, normal S1/S2, no murmurs Respiratory: No increased work of breathing.  Lungs clear to auscultation bilaterally.  No wheezes. Abdomen: soft, nontender, nondistended. Normal bowel sounds.  No appreciable masses  Extremities: warm, well perfused, cap refill < 2 sec.   Musculoskeletal: Normal muscle mass.  Normal strength Skin: warm, dry.  No rash or lesions. Neurologic: alert and oriented, normal speech, no tremor   Labs:  Results for orders placed or performed in visit on 01/24/22  POCT Glucose (Device for  Home Use)  Result Value Ref Range   Glucose Fasting, POC     POC Glucose 141 (A) 70 - 99 mg/dl    Assessment/Plan: Daily is a 21 y.o. male with uncontrolled type 1 diabetes using Tandem Tslim insulin pump. His blood glucose contro has signficantly improved since restarted Tandem TSlim and Dexcom CGM. I will reduce his carb ratio to prevent hypoglycemia. TIR has increased to 49% but is below goal of >70%.   1-2. DM w/o complication type I, uncontrolled (HCC)/Hyperglycemia - Reviewed insulin pump and CGM download. Discussed trends and patterns.  - Rotate pump sites to prevent scar tissue.  - bolus 15 minutes prior to eating to limit blood sugar spikes.  - Reviewed carb counting and importance of accurate carb counting.  - Discussed signs and symptoms of hypoglycemia. Always have glucose available.  - POCT glucose and hemoglobin A1c  - Reviewed growth chart.    3. Adjustment reaction/noncompliance  - Praise given for improvements.  - Set goals to wear pump and CGM consistently until next visit.    4. Insulin pump Titration /Insulin pump in place.   Insulin to Carbohydrate Ratio 12AM 10  6am 4--> 6   11am 3 --> 5   9pm 4--> 5         Insulin Sensitivity Factor 12AM 24  3am 24      7am 20--> 25   8am 9pm  20--> 25  20--> 25     Target Blood Glucose - Lantus 45 units  - Novolog ICR 1:5, ISF 1:30 >120 day and >180 night.    Follow-up:  4 weeks.   LOS: >40 spent today reviewing the medical chart, counseling the patient/family, and documenting today's visit.   When a patient is on insulin, intensive monitoring of blood glucose levels is necessary to avoid hyperglycemia and hypoglycemia. Severe hyperglycemia/hypoglycemia can lead to hospital admissions and be life threatening.     Gretchen Short,  FNP-C  Pediatric Specialist  [address]  [place], [address]  Tele: [phone]

## 2022-01-24 NOTE — Patient Instructions (Signed)
-    Insulin to Carbohydrate Ratio 12AM 10  6am 4--> 6   11am 3 --> 5   9pm 4--> 5         Insulin Sensitivity Factor 12AM 24  3am 24     7am 20--> 25   8am 9pm  20--> 25  20--> 25

## 2022-02-20 ENCOUNTER — Ambulatory Visit (INDEPENDENT_AMBULATORY_CARE_PROVIDER_SITE_OTHER): Payer: Medicaid Other | Admitting: Family

## 2022-03-13 ENCOUNTER — Ambulatory Visit (INDEPENDENT_AMBULATORY_CARE_PROVIDER_SITE_OTHER): Payer: Medicaid Other | Admitting: Family

## 2022-03-13 ENCOUNTER — Encounter (INDEPENDENT_AMBULATORY_CARE_PROVIDER_SITE_OTHER): Payer: Self-pay | Admitting: Family

## 2022-03-13 VITALS — BP 120/75 | HR 85 | Wt 176.2 lb

## 2022-03-13 DIAGNOSIS — E1065 Type 1 diabetes mellitus with hyperglycemia: Secondary | ICD-10-CM | POA: Diagnosis not present

## 2022-03-13 DIAGNOSIS — Z91199 Patient's noncompliance with other medical treatment and regimen due to unspecified reason: Secondary | ICD-10-CM | POA: Diagnosis not present

## 2022-03-13 DIAGNOSIS — Z794 Long term (current) use of insulin: Secondary | ICD-10-CM

## 2022-03-13 LAB — POCT GLYCOSYLATED HEMOGLOBIN (HGB A1C): Hemoglobin A1C: 10.8 % — AB (ref 4.0–5.6)

## 2022-03-13 LAB — POCT GLUCOSE (DEVICE FOR HOME USE): POC Glucose: 231 mg/dl — AB (ref 70–99)

## 2022-03-13 NOTE — Progress Notes (Signed)
Pediatric Endocrinology Diabetes Consultation Follow-up Visit  JAIDON ELLERY [birth year] [id number]  Chief Complaint: Follow-up type 1 diabetes   [name], MD   HPI: Birch  is a 21 y.o. male presenting for follow-up of type 1 diabetes. he is accompanied to this visit by his grandmother  .  1. Treyce was admitted to the pediatric ward at [facility] on 11/13/11 for the above chief complaint. He had had polyuria and polydipsia. His initial BG was 340. Initial serum glucose was 354. Serum CO2 was 18. Venous pH was 7.367.  Hemoglobin A1c was 13.3%. Serum C-peptide was 0.68 (normal 0.80-390). Urine glucose was >1000 and urine ketones were >80. Anti-islet cell antibody was 40 (normal <5). Insulin antibodies were 5.0 (normal <0.4).Anti-GAD antibody was negative at <1.0. TSH was 2.344, free T4 1.36, free T3 3.5.  Tissue transglutaminase IgA was 2.2 (normal <20). Gliadin IgA antibodies were 4.9 (normal <20). He was started on Lantus as a basal insulin and Novolog as a bolus insulin at mealtimes, and also at bedtime and 2 AM if needed. After receiving IV fluids, insulins, and DM education, he was discharged on 11/17/11.   2. Since last visit to [facility] on 01/2022, he has been well since his last visit.   He is working at [employer] 3-4 days per week. His tandem insulin pump stopped working 2 weeks ago, it will no longer charge. He has not called Tandem to request a replacement. He has been using MDI since that time. He has been taking 45 units of Lantus and takes around 13 units of Novolog per meal. Has not been wearing Dexcom CGM consistently, states that he ran out of sensors. Occasionally hypoglycemic but rare overall.    Insulin regimen: Tsim insulin pump  - Basal Rates 12AM 1.85  3am 1.95  6am 2.0   8am 2.20   9pm  2.20    50.6 units per day  Insulin to Carbohydrate Ratio 12AM 10  6am 6   11am 5   9pm 5       Insulin Sensitivity Factor 12AM 24  3am 24     7am 25   8am 9pm  25   25      Target Blood Glucose 12AM 110   6am 110  9pm 110           Hypoglycemia: Is consistently able to feel low blood sugars.practice. No glucagon needed.  Insulin pump download:   - Dexcom report shows that he has worn Dexcom less the 14 days out of the past 30.   Med-alert ID: Bracelet  Injection sites: legs, abdomen  Annual labs due: 2024 Ophthalmology due: 2023. Discussed he is overdue. Needs to have exam.     3. ROS: Greater than 10 systems reviewed with pertinent positives listed in HPI, otherwise neg. Constitutional: Sleeping well. 4 lbs weight gain  Eyes: No changes in vision. No blurry vision.  Ears/Nose/Mouth/Throat: No difficulty swallowing. Denies neck pain  Cardiovascular: No palpitations. Denies chest pain  Respiratory: No increased work of breathing. Denies SOB   Neurologic: Normal sensation, no tremor GI: No abdominal pain. No constipation/diarrhea.  Endocrine: No polydipsia.  No hyperpigmentation Psychiatric: Normal affect today. Denies depression and anxiety   Past Medical History:   Past Medical History:  Diagnosis Date   Diabetes mellitus 11/14/2011   Diabetes mellitus without complication (HCC)    Phreesia 03/03/2020    Medications:  Outpatient Encounter Medications as of 03/13/2022  Medication Sig Note   Continuous Blood Gluc  Sensor (DEXCOM G6 SENSOR) MISC 1 Device by Does not apply route continuous.    Continuous Blood Gluc Transmit (DEXCOM G6 TRANSMITTER) MISC Change every 90 days    insulin lispro (HUMALOG) 100 UNIT/ML injection USE 300 UNITS IN INSULIN PUMP EVERY 48 HOURS    glucagon 1 MG injection Use for Severe Hypoglycemia, if unresponsive, unable to swallow, unconscious and/or has seizure (Patient not taking: Reported on 12/12/2021) 12/08/2021: PRN has not used   insulin aspart (NOVOLOG) 100 UNIT/ML FlexPen Inject 10 Units into the skin 3 (three) times daily with meals. ONLY IN CASE OF PUMP FAILURE (Patient not taking: Reported on  12/12/2021)    insulin glargine (LANTUS) 100 UNIT/ML Solostar Pen Inject 35 Units into the skin daily. Inject 35 units in the skin ONLY in case of pump failure (Patient not taking: Reported on 01/24/2022)    Insulin Pen Needle 32G X 4 MM MISC 1 applicator by Does not apply route 3 (three) times daily. Inject up to 5 x per day. (Patient not taking: Reported on 01/09/2022)    No facility-administered encounter medications on file as of 03/13/2022.    Allergies: Allergies  Allergen Reactions   Amoxicillin Hives    Surgical History: Past Surgical History:  Procedure Laterality Date   nasal cauterization     NASAL HEMORRHAGE CONTROL  05/14/2006    Family History:  Family History  Problem Relation Age of Onset   Asthma Maternal Aunt    Cancer Maternal Grandfather    Diabetes Paternal Grandmother    Hypertension Other    Thyroid disease Neg Hx       Social History: Lives with: mother  Graduated. Unemployed currently   Physical Exam:  Vitals:   03/13/22 1024  BP: 120/75  Pulse: 85  Weight: 176 lb 3.2 oz (79.9 kg)         BP 120/75   Pulse 85   Wt 176 lb 3.2 oz (79.9 kg)   BMI 27.60 kg/m  Body mass index: body mass index is 27.6 kg/m. Blood pressure %iles are not available for patients who are 18 years or older.  Ht Readings from Last 3 Encounters:  12/08/21 5\' 7"  (1.702 m) (18 %, Z= -0.92)*  03/21/21 5\' 5"  (1.651 m) (6 %, Z= -1.59)*  02/07/21 5' 5.35" (1.66 m) (7 %, Z= -1.46)*   * Growth percentiles are based on CDC (Boys, 2-20 Years) data.   Wt Readings from Last 3 Encounters:  03/13/22 176 lb 3.2 oz (79.9 kg) (77 %, Z= 0.74)*  01/24/22 172 lb 9.6 oz (78.3 kg) (74 %, Z= 0.64)*  01/09/22 167 lb 6.4 oz (75.9 kg) (68 %, Z= 0.47)*   * Growth percentiles are based on CDC (Boys, 2-20 Years) data.    PHYSICAL EXAM: General: Well developed, well nourished male in no acute distress.   Head: Normocephalic, atraumatic.   Eyes:  Pupils equal and round. EOMI.   Sclera white.  No eye drainage.   Ears/Nose/Mouth/Throat: Nares patent, no nasal drainage.  Normal dentition, mucous membranes moist.  Neck: supple, no cervical lymphadenopathy, no thyromegaly Cardiovascular: regular rate, normal S1/S2, no murmurs Respiratory: No increased work of breathing.  Lungs clear to auscultation bilaterally.  No wheezes. Abdomen: soft, nontender, nondistended. Normal bowel sounds.  No appreciable masses  Extremities: warm, well perfused, cap refill < 2 sec.   Musculoskeletal: Normal muscle mass.  Normal strength Skin: warm, dry.  No rash or lesions. Neurologic: alert and oriented, normal speech, no tremor  Labs:  Results for orders placed or performed in visit on 03/13/22  POCT glycosylated hemoglobin (Hb A1C)  Result Value Ref Range   Hemoglobin A1C 10.8 (A) 4.0 - 5.6 %   HbA1c POC (<> result, manual entry)     HbA1c, POC (prediabetic range)     HbA1c, POC (controlled diabetic range)    POCT Glucose (Device for Home Use)  Result Value Ref Range   Glucose Fasting, POC     POC Glucose 231 (A) 70 - 99 mg/dl    Assessment/Plan: Ahkeem is a 21 y.o. male with uncontrolled type 1 diabetes using Tandem Tslim insulin pump. Has not been wearing insulin pump or CGM consistently leading to more variable blood sugars. His hemoglobin A1c has increased to 10.8% which is higher then ADA goal of <7%.    1-2. DM w/o complication type I, uncontrolled (HCC)/Hyperglycemia - Reviewed insulin pump and CGM download. Discussed trends and patterns.  - Rotate pump sites to prevent scar tissue.  - bolus 15 minutes prior to eating to limit blood sugar spikes.  - Reviewed carb counting and importance of accurate carb counting.  - Discussed signs and symptoms of hypoglycemia. Always have glucose available.  - POCT glucose and hemoglobin A1c  - Reviewed growth chart.    3. Adjustment reaction/noncompliance  - Advised to call Tandem to have pump replaced immediately  -  Discussed importance of improving diabetes care quickly and prevent diabetes related complications.  - Dicussed barriers to care.    4. Insulin pump Titration /Insulin pump in place.  NO changes today.   Target Blood Glucose - Decrease lantus to 43 units to reduce early morning hypoglycemia.  - Novolog ICR 1:5, ISF 1:30 >120 day and >180 night.    Follow-up:  4 weeks.   LOS: >40  spent today reviewing the medical chart, counseling the patient/family, and documenting today's visit.  .   When a patient is on insulin, intensive monitoring of blood glucose levels is necessary to avoid hyperglycemia and hypoglycemia. Severe hyperglycemia/hypoglycemia can lead to hospital admissions and be life threatening.     Hermenia Bers,  FNP-C  Pediatric Specialist  [address] [place]  [place], [address]  Tele: [phone]

## 2022-03-13 NOTE — Patient Instructions (Addendum)
-   Decrease lantus to 43 units.  - Novolog ICR 1:5, ISF 1:30 >120 day and >180 night.  - It was a pleasure seeing you in clinic today. Please do not hesitate to contact me if you have questions or concerns.   Please sign up for MyChart. This is a communication tool that allows you to send an email directly to me. This can be used for questions, prescriptions and blood sugar reports. We will also release labs to you with instructions on MyChart. Please do not use MyChart if you need immediate or emergency assistance. Ask our wonderful front office staff if you need assistance.    Call Dexcom Customer Support to have pump replaced:   - Technical support: 1 [phone] Max basal 2.5 units   Basal Rates 12AM 1.85  3am 1.95  6am 2.0   8am 2.20   9pm  2.20    50.6 units per day   Insulin to Carbohydrate Ratio 12AM 10  6am 6   11am 5   9pm 5      Max bolus 25 units    Insulin Sensitivity Factor 12AM 24  3am 24     7am 25   8am 9pm  25  25      Target Blood Glucose 12AM 110   6am 110  9pm 110

## 2022-04-17 ENCOUNTER — Ambulatory Visit (INDEPENDENT_AMBULATORY_CARE_PROVIDER_SITE_OTHER): Payer: Medicaid Other | Admitting: Family

## 2022-04-17 ENCOUNTER — Encounter (INDEPENDENT_AMBULATORY_CARE_PROVIDER_SITE_OTHER): Payer: Self-pay | Admitting: Family

## 2022-04-17 VITALS — BP 112/70 | HR 86 | Wt 170.0 lb

## 2022-04-17 DIAGNOSIS — E1065 Type 1 diabetes mellitus with hyperglycemia: Secondary | ICD-10-CM | POA: Diagnosis not present

## 2022-04-17 DIAGNOSIS — Z4681 Encounter for fitting and adjustment of insulin pump: Secondary | ICD-10-CM | POA: Diagnosis not present

## 2022-04-17 DIAGNOSIS — Z91199 Patient's noncompliance with other medical treatment and regimen due to unspecified reason: Secondary | ICD-10-CM | POA: Diagnosis not present

## 2022-04-17 LAB — POCT GLUCOSE (DEVICE FOR HOME USE): POC Glucose: 203 mg/dl — AB (ref 70–99)

## 2022-04-17 NOTE — Progress Notes (Signed)
Pediatric Endocrinology Diabetes Consultation Follow-up Visit  Ricky Shaw 2000/12/18 [id number]  Chief Complaint: Follow-up type 1 diabetes   Ricky Benders, MD   HPI: Ricky Shaw  is a 21 y.o. male presenting for follow-up of type 1 diabetes. he is accompanied to this visit by his grandmother  .  1. Ricky Shaw was admitted to the pediatric ward at [facility] on 11/13/11 for the above chief complaint. He had had polyuria and polydipsia. His initial BG was 340. Initial serum glucose was 354. Serum CO2 was 18. Venous pH was 7.367.  Hemoglobin A1c was 13.3%. Serum C-peptide was 0.68 (normal 0.80-390). Urine glucose was >1000 and urine ketones were >80. Anti-islet cell antibody was 40 (normal <5). Insulin antibodies were 5.0 (normal <0.4).Anti-GAD antibody was negative at <1.0. TSH was 2.344, free T4 1.36, free T3 3.5.  Tissue transglutaminase IgA was 2.2 (normal <20). Gliadin IgA antibodies were 4.9 (normal <20). He was started on Lantus as a basal insulin and Novolog as a bolus insulin at mealtimes, and also at bedtime and 2 AM if needed. After receiving IV fluids, insulins, and DM education, he was discharged on 11/17/11.   2. Since last visit to [facility] on 02/2022, he has been well since his last visit.   He is applying for jobs. Reports that he feels like he has been doing better with his diabetes care since his last visit. He started wearing his Tslim insulin pump again and Dexcom CGM which has been helpful. Blood sugars have been better. He is working harder to remember to bolus, occasionally forgets. No severe hypoglycemia, he is able to feel his lows.   Insulin regimen: Tsim insulin pump  - Basal Rates 12AM 1.85  3am 1.95  6am 2.0   8am 2.20   9pm  2.20    50.6 units per day  Insulin to Carbohydrate Ratio 12AM 10  6am 6   11am 5   9pm 5       Insulin Sensitivity Factor 12AM 24  3am 24     6am 25   8am 9pm  25  25      Target Blood Glucose 12AM 110   6am 110  9pm 110            Hypoglycemia: Is consistently able to feel low blood sugars.practice. No glucagon needed.  Insulin pump download:   - Dexcom report shows that he has worn Dexcom less the 14 days out of the past 30.   Med-alert ID: Bracelet  Injection sites: legs, abdomen  Annual labs due: 2024 Ophthalmology due: 2023. Discussed he is overdue. Needs to have exam.     3. ROS: Greater than 10 systems reviewed with pertinent positives listed in HPI, otherwise neg. Constitutional: Sleeping well. 4 lbs weight gain  Eyes: No changes in vision. No blurry vision.  Ears/Nose/Mouth/Throat: No difficulty swallowing. Denies neck pain  Cardiovascular: No palpitations. Denies chest pain  Respiratory: No increased work of breathing. Denies SOB   Neurologic: Normal sensation, no tremor GI: No abdominal pain. No constipation/diarrhea.  Endocrine: No polydipsia.  No hyperpigmentation Psychiatric: Normal affect today. Denies depression and anxiety   Past Medical History:   Past Medical History:  Diagnosis Date   Diabetes mellitus 11/14/2011   Diabetes mellitus without complication (Ricky Shaw)    Phreesia 03/03/2020    Medications:  Outpatient Encounter Medications as of 04/17/2022  Medication Sig Note   Continuous Blood Gluc Sensor (DEXCOM G6 SENSOR) MISC 1 Device by Does not apply route continuous.  Continuous Blood Gluc Transmit (DEXCOM G6 TRANSMITTER) MISC Change every 90 days    glucagon 1 MG injection Use for Severe Hypoglycemia, if unresponsive, unable to swallow, unconscious and/or has seizure 12/08/2021: PRN has not used   insulin aspart (NOVOLOG) 100 UNIT/ML FlexPen Inject 10 Units into the skin 3 (three) times daily with meals. ONLY IN CASE OF PUMP FAILURE    insulin glargine (LANTUS) 100 UNIT/ML Solostar Pen Inject 35 Units into the skin daily. Inject 35 units in the skin ONLY in case of pump failure    insulin lispro (HUMALOG) 100 UNIT/ML injection USE 300 UNITS IN INSULIN PUMP EVERY 48 HOURS     Insulin Pen Needle 32G X 4 MM MISC 1 applicator by Does not apply route 3 (three) times daily. Inject up to 5 x per day.    No facility-administered encounter medications on file as of 04/17/2022.    Allergies: Allergies  Allergen Reactions   Amoxicillin Hives    Surgical History: Past Surgical History:  Procedure Laterality Date   nasal cauterization     NASAL HEMORRHAGE CONTROL  05/14/2006    Family History:  Family History  Problem Relation Age of Onset   Asthma Maternal Aunt    Cancer Maternal Grandfather    Diabetes Paternal Grandmother    Hypertension Other    Thyroid disease Neg Hx       Social History: Lives with: mother  Graduated. Unemployed currently   Physical Exam:  Vitals:   04/17/22 1109  BP: 112/70  Pulse: 86  Weight: 170 lb (77.1 kg)          BP 112/70   Pulse 86   Wt 170 lb (77.1 kg)   BMI 26.63 kg/m  Body mass index: body mass index is 26.63 kg/m. Blood pressure %iles are not available for patients who are 18 years or older.  Ht Readings from Last 3 Encounters:  12/08/21 5\' 7"  (1.702 m) (18 %, Z= -0.92)*  03/21/21 5\' 5"  (1.651 m) (6 %, Z= -1.59)*  02/07/21 5' 5.35" (1.66 m) (7 %, Z= -1.46)*   * Growth percentiles are based on CDC (Boys, 2-20 Years) data.   Wt Readings from Last 3 Encounters:  04/17/22 170 lb (77.1 kg) (70 %, Z= 0.53)*  03/13/22 176 lb 3.2 oz (79.9 kg) (77 %, Z= 0.74)*  01/24/22 172 lb 9.6 oz (78.3 kg) (74 %, Z= 0.64)*   * Growth percentiles are based on CDC (Boys, 2-20 Years) data.    PHYSICAL EXAM: General: Well developed, well nourished male in no acute distress.   Head: Normocephalic, atraumatic.   Eyes:  Pupils equal and round. EOMI.  Sclera white.  No eye drainage.   Ears/Nose/Mouth/Throat: Nares patent, no nasal drainage.  Normal dentition, mucous membranes moist.  Neck: supple, no cervical lymphadenopathy, no thyromegaly Cardiovascular: regular rate, normal S1/S2, no murmurs Respiratory: No  increased work of breathing.  Lungs clear to auscultation bilaterally.  No wheezes. Abdomen: soft, nontender, nondistended. Normal bowel sounds.  No appreciable masses  Extremities: warm, well perfused, cap refill < 2 sec.   Musculoskeletal: Normal muscle mass.  Normal strength Skin: warm, dry.  No rash or lesions. Neurologic: alert and oriented, normal speech, no tremor    Labs:  Results for orders placed or performed in visit on 04/17/22  POCT Glucose (Device for Home Use)  Result Value Ref Range   Glucose Fasting, POC     POC Glucose 203 (A) 70 - 99 mg/dl    Assessment/Plan:  Michall is a 21 y.o. male with uncontrolled type 1 diabetes using Tandem Tslim insulin pump. Ty has made improvements with diabetes care and is consistently wearing his pump and CGM. His average blood sugars has improved and his time in target range has increased to 40%. Pattern of post prandial hyperglycemia after dinner, will adjust pump settings.    1-2. DM w/o complication type I, uncontrolled (HCC)/Hyperglycemia - Reviewed insulin pump and CGM download. Discussed trends and patterns.  - Rotate pump sites to prevent scar tissue.  - bolus 15 minutes prior to eating to limit blood sugar spikes.  - Reviewed carb counting and importance of accurate carb counting.  - Discussed signs and symptoms of hypoglycemia. Always have glucose available.  - POCT glucose and hemoglobin A1c  - Reviewed growth chart.  - Discussed monitoring for pump site failure and what happens when that occurs.  - Labs; CMP, lipid panel, TFTs and microalbumin ordered   3. Adjustment reaction/noncompliance  - Stressed importance of good diabetes care to prevent diabetes related complications.  - Praise given for improvements.    4. Insulin pump Titration /Insulin pump in place.  - Basal Rates 12AM 1.85--> 1.90   3am 1.95--> 2.0   6am 2.0   8am 2.20 --> 2.30   9pm  2.20    52.2  units per day   Insulin to Carbohydrate  Ratio 12AM 10  3am 10  6am 6  8am 6--> 5   9pm 5   Target Blood Glucose - Decrease lantus to 43 units to reduce early morning hypoglycemia.  - Novolog ICR 1:5, ISF 1:30 >120 day and >180 night.    Follow-up:  4 weeks.   LOS: >40  spent today reviewing the medical chart, counseling the patient/family, and documenting today's visit.    When a patient is on insulin, intensive monitoring of blood glucose levels is necessary to avoid hyperglycemia and hypoglycemia. Severe hyperglycemia/hypoglycemia can lead to hospital admissions and be life threatening.     Gretchen Short,  FNP-C  Pediatric Specialist  [address]  [place], [address]  Tele: [phone]

## 2022-04-17 NOTE — Patient Instructions (Signed)
-   Basal Rates 12AM 1.85--> 1.90   3am 1.95--> 2.0   6am 2.0   8am 2.20 --> 2.30   9pm  2.20    52.2  units per day   Insulin to Carbohydrate Ratio 12AM 10  3am 10  6am 6  8am 6--> 5   9pm 5    It was a pleasure seeing you in clinic today. Please do not hesitate to contact me if you have questions or concerns.   Please sign up for MyChart. This is a communication tool that allows you to send an email directly to me. This can be used for questions, prescriptions and blood sugar reports. We will also release labs to you with instructions on MyChart. Please do not use MyChart if you need immediate or emergency assistance. Ask our wonderful front office staff if you need assistance.

## 2022-04-18 LAB — COMPLETE METABOLIC PANEL WITH GFR
AG Ratio: 1.6 (calc) (ref 1.0–2.5)
ALT: 11 U/L (ref 8–46)
AST: 12 U/L (ref 12–32)
Albumin: 4.6 g/dL (ref 3.6–5.1)
Alkaline phosphatase (APISO): 49 U/L (ref 46–169)
BUN: 16 mg/dL (ref 7–20)
CO2: 27 mmol/L (ref 20–32)
Calcium: 10.1 mg/dL (ref 8.9–10.4)
Chloride: 101 mmol/L (ref 98–110)
Creat: 0.82 mg/dL (ref 0.60–1.24)
Globulin: 2.9 g/dL (calc) (ref 2.1–3.5)
Glucose, Bld: 217 mg/dL — ABNORMAL HIGH (ref 65–139)
Potassium: 4.4 mmol/L (ref 3.8–5.1)
Sodium: 138 mmol/L (ref 135–146)
Total Bilirubin: 0.4 mg/dL (ref 0.2–1.1)
Total Protein: 7.5 g/dL (ref 6.3–8.2)
eGFR: 130 mL/min/{1.73_m2} (ref >=60)

## 2022-04-18 LAB — TSH: TSH: 1.58 mIU/L (ref 0.50–4.30)

## 2022-04-18 LAB — LIPID PANEL
Cholesterol: 200 mg/dL — ABNORMAL HIGH (ref <170)
HDL: 39 mg/dL — ABNORMAL LOW (ref >45)
LDL Cholesterol (Calc): 144 mg/dL (calc) — ABNORMAL HIGH (ref <110)
Non-HDL Cholesterol (Calc): 161 mg/dL (calc) — ABNORMAL HIGH (ref <120)
Total CHOL/HDL Ratio: 5.1 (calc) — ABNORMAL HIGH (ref <5.0)
Triglycerides: 71 mg/dL (ref <90)

## 2022-04-18 LAB — T4, FREE: Free T4: 1.2 ng/dL (ref 0.8–1.4)

## 2022-05-17 ENCOUNTER — Ambulatory Visit (INDEPENDENT_AMBULATORY_CARE_PROVIDER_SITE_OTHER): Payer: Medicaid Other | Admitting: Family

## 2022-05-27 IMAGING — CR DG CHEST 1V
1 series · 1 of 1 positions shown · non-contrast
Comparison: Portable exam 2198 hours without priors for comparison

CLINICAL DATA: Chest pain since this morning

EXAM:
CHEST  1 VIEW

[chest ap]
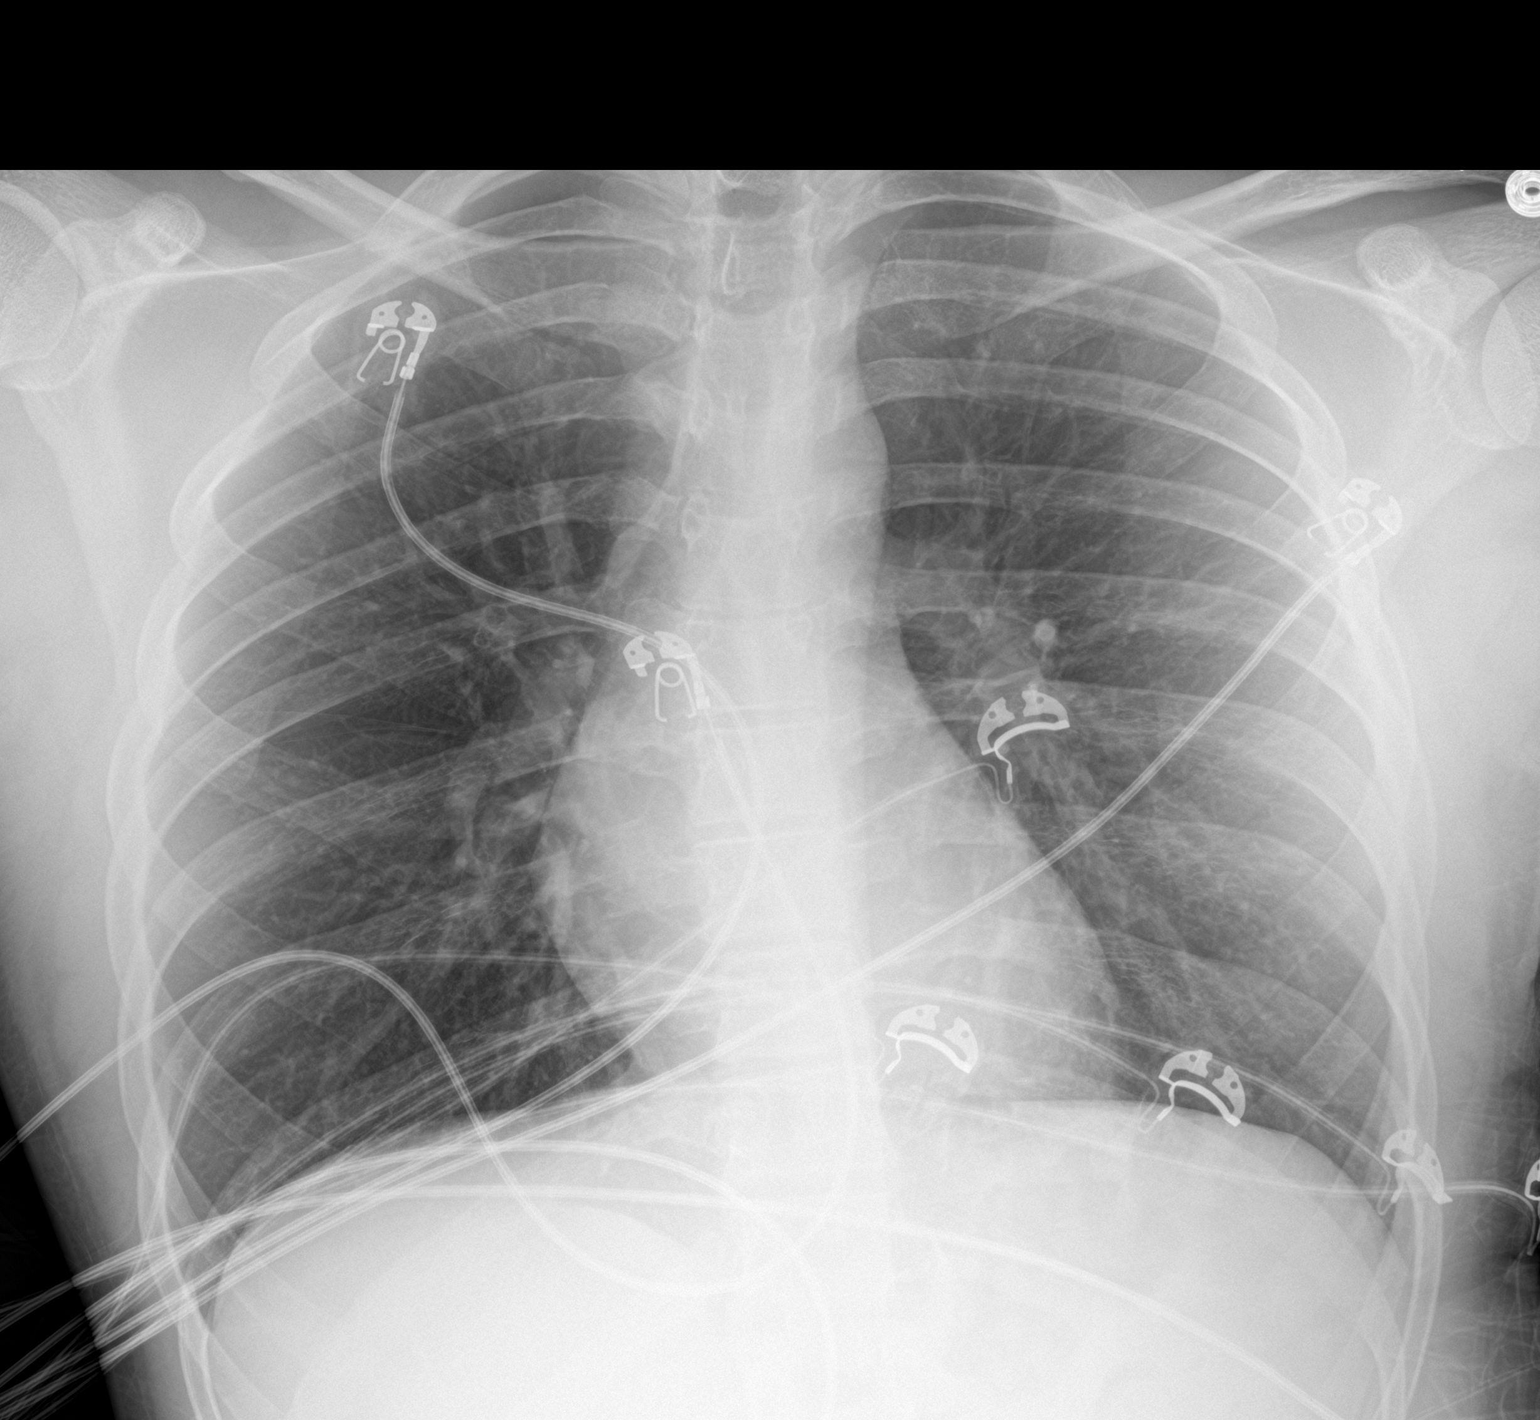

[1 of 1 positions shown; findings below may reference images not displayed]

FINDINGS: Normal heart size, mediastinal contours, and pulmonary vascularity.

Lungs clear.

No pulmonary infiltrate, pleural effusion, or pneumothorax.

Osseous structures unremarkable.
IMPRESSION: No acute abnormalities.

## 2022-08-16 ENCOUNTER — Ambulatory Visit (INDEPENDENT_AMBULATORY_CARE_PROVIDER_SITE_OTHER): Payer: Medicaid Other | Admitting: Family

## 2022-08-28 ENCOUNTER — Inpatient Hospital Stay (HOSPITAL_COMMUNITY)
Admission: EM | Admit: 2022-08-28 | Discharge: 2022-08-29 | DRG: 919 | Disposition: A | Discharge status: Home / Self Care | Payer: Medicaid Other | Attending: Family Medicine | Admitting: Family Medicine

## 2022-08-28 ENCOUNTER — Other Ambulatory Visit: Payer: Self-pay

## 2022-08-28 ENCOUNTER — Encounter (HOSPITAL_COMMUNITY): Payer: Self-pay | Admitting: Internal Medicine

## 2022-08-28 DIAGNOSIS — E875 Hyperkalemia: Secondary | ICD-10-CM | POA: Diagnosis present

## 2022-08-28 DIAGNOSIS — R7989 Other specified abnormal findings of blood chemistry: Secondary | ICD-10-CM | POA: Diagnosis present

## 2022-08-28 DIAGNOSIS — Z91199 Patient's noncompliance with other medical treatment and regimen due to unspecified reason: Secondary | ICD-10-CM | POA: Diagnosis not present

## 2022-08-28 DIAGNOSIS — Z8249 Family history of ischemic heart disease and other diseases of the circulatory system: Secondary | ICD-10-CM | POA: Diagnosis not present

## 2022-08-28 DIAGNOSIS — N179 Acute kidney failure, unspecified: Secondary | ICD-10-CM | POA: Diagnosis present

## 2022-08-28 DIAGNOSIS — E1065 Type 1 diabetes mellitus with hyperglycemia: Secondary | ICD-10-CM | POA: Diagnosis present

## 2022-08-28 DIAGNOSIS — Z794 Long term (current) use of insulin: Secondary | ICD-10-CM | POA: Diagnosis not present

## 2022-08-28 DIAGNOSIS — Y831 Surgical operation with implant of artificial internal device as the cause of abnormal reaction of the patient, or of later complication, without mention of misadventure at the time of the procedure: Secondary | ICD-10-CM | POA: Diagnosis present

## 2022-08-28 DIAGNOSIS — Z833 Family history of diabetes mellitus: Secondary | ICD-10-CM | POA: Diagnosis not present

## 2022-08-28 DIAGNOSIS — E1010 Type 1 diabetes mellitus with ketoacidosis without coma: Secondary | ICD-10-CM | POA: Diagnosis present

## 2022-08-28 DIAGNOSIS — Z881 Allergy status to other antibiotic agents status: Secondary | ICD-10-CM | POA: Diagnosis not present

## 2022-08-28 DIAGNOSIS — E1110 Type 2 diabetes mellitus with ketoacidosis without coma: Secondary | ICD-10-CM | POA: Diagnosis present

## 2022-08-28 DIAGNOSIS — R6510 Systemic inflammatory response syndrome (SIRS) of non-infectious origin without acute organ dysfunction: Secondary | ICD-10-CM | POA: Diagnosis present

## 2022-08-28 DIAGNOSIS — T383X6A Underdosing of insulin and oral hypoglycemic [antidiabetic] drugs, initial encounter: Secondary | ICD-10-CM | POA: Diagnosis present

## 2022-08-28 DIAGNOSIS — T85694A Other mechanical complication of insulin pump, initial encounter: Secondary | ICD-10-CM | POA: Diagnosis present

## 2022-08-28 DIAGNOSIS — Z79899 Other long term (current) drug therapy: Secondary | ICD-10-CM

## 2022-08-28 DIAGNOSIS — E871 Hypo-osmolality and hyponatremia: Secondary | ICD-10-CM | POA: Diagnosis present

## 2022-08-28 DIAGNOSIS — E860 Dehydration: Secondary | ICD-10-CM | POA: Diagnosis present

## 2022-08-28 LAB — URINALYSIS, ROUTINE W REFLEX MICROSCOPIC
Bilirubin Urine: NEGATIVE
Glucose, UA: >=500 mg/dL — AB
Ketones, ur: 80 mg/dL — AB
Leukocytes,Ua: NEGATIVE
Nitrite: NEGATIVE
Protein, ur: 100 mg/dL — AB
Specific Gravity, Urine: 1.024 (ref 1.005–1.030)
pH: 5 (ref 5.0–8.0)

## 2022-08-28 LAB — GLUCOSE, CAPILLARY
Glucose-Capillary: 281 mg/dL — ABNORMAL HIGH (ref 70–99)
Glucose-Capillary: 209 mg/dL — ABNORMAL HIGH (ref 70–99)
Glucose-Capillary: 211 mg/dL — ABNORMAL HIGH (ref 70–99)
Glucose-Capillary: 224 mg/dL — ABNORMAL HIGH (ref 70–99)
Glucose-Capillary: 187 mg/dL — ABNORMAL HIGH (ref 70–99)
Glucose-Capillary: 169 mg/dL — ABNORMAL HIGH (ref 70–99)
Glucose-Capillary: 160 mg/dL — ABNORMAL HIGH (ref 70–99)
Glucose-Capillary: 145 mg/dL — ABNORMAL HIGH (ref 70–99)

## 2022-08-28 LAB — BASIC METABOLIC PANEL
Anion gap: 21 — ABNORMAL HIGH (ref 5–15)
Anion gap: 7 (ref 5–15)
Anion gap: 13 (ref 5–15)
BUN: 18 mg/dL (ref 6–20)
BUN: 16 mg/dL (ref 6–20)
BUN: 15 mg/dL (ref 6–20)
BUN: 14 mg/dL (ref 6–20)
BUN: 13 mg/dL (ref 6–20)
CO2: 9 mmol/L — ABNORMAL LOW (ref 22–32)
CO2: <7 mmol/L — ABNORMAL LOW (ref 22–32)
CO2: <7 mmol/L — ABNORMAL LOW (ref 22–32)
CO2: 12 mmol/L — ABNORMAL LOW (ref 22–32)
CO2: 17 mmol/L — ABNORMAL LOW (ref 22–32)
Calcium: 9.2 mg/dL (ref 8.9–10.3)
Calcium: 8.4 mg/dL — ABNORMAL LOW (ref 8.9–10.3)
Calcium: 8.8 mg/dL — ABNORMAL LOW (ref 8.9–10.3)
Calcium: 8.7 mg/dL — ABNORMAL LOW (ref 8.9–10.3)
Calcium: 9.1 mg/dL (ref 8.9–10.3)
Chloride: 98 mmol/L (ref 98–111)
Chloride: 103 mmol/L (ref 98–111)
Chloride: 106 mmol/L (ref 98–111)
Chloride: 113 mmol/L — ABNORMAL HIGH (ref 98–111)
Chloride: 108 mmol/L (ref 98–111)
Creatinine, Ser: 1.34 mg/dL — ABNORMAL HIGH (ref 0.61–1.24)
Creatinine, Ser: 1.31 mg/dL — ABNORMAL HIGH (ref 0.61–1.24)
Creatinine, Ser: 1.3 mg/dL — ABNORMAL HIGH (ref 0.61–1.24)
Creatinine, Ser: 1.19 mg/dL (ref 0.61–1.24)
Creatinine, Ser: 1.08 mg/dL (ref 0.61–1.24)
GFR, Estimated: >60 mL/min (ref >60)
GFR, Estimated: >60 mL/min (ref >60)
GFR, Estimated: >60 mL/min (ref >60)
GFR, Estimated: >60 mL/min (ref >60)
GFR, Estimated: >60 mL/min (ref >60)
Glucose, Bld: 485 mg/dL — ABNORMAL HIGH (ref 70–99)
Glucose, Bld: 460 mg/dL — ABNORMAL HIGH (ref 70–99)
Glucose, Bld: 258 mg/dL — ABNORMAL HIGH (ref 70–99)
Glucose, Bld: 188 mg/dL — ABNORMAL HIGH (ref 70–99)
Glucose, Bld: 147 mg/dL — ABNORMAL HIGH (ref 70–99)
Potassium: 6.1 mmol/L — ABNORMAL HIGH (ref 3.5–5.1)
Potassium: 6.1 mmol/L — ABNORMAL HIGH (ref 3.5–5.1)
Potassium: 4 mmol/L (ref 3.5–5.1)
Potassium: 4.1 mmol/L (ref 3.5–5.1)
Potassium: 4.5 mmol/L (ref 3.5–5.1)
Sodium: 128 mmol/L — ABNORMAL LOW (ref 135–145)
Sodium: 130 mmol/L — ABNORMAL LOW (ref 135–145)
Sodium: 131 mmol/L — ABNORMAL LOW (ref 135–145)
Sodium: 132 mmol/L — ABNORMAL LOW (ref 135–145)
Sodium: 138 mmol/L (ref 135–145)

## 2022-08-28 LAB — BLOOD GAS, VENOUS
Acid-base deficit: 19.3 mmol/L — ABNORMAL HIGH (ref 0.0–2.0)
Bicarbonate: 9.4 mmol/L — ABNORMAL LOW (ref 20.0–28.0)
O2 Saturation: 79 %
Patient temperature: 37
pCO2, Ven: 31 mmHg — ABNORMAL LOW (ref 44–60)
pH, Ven: 7.09 — CL (ref 7.25–7.43)
pO2, Ven: 49 mmHg — ABNORMAL HIGH (ref 32–45)

## 2022-08-28 LAB — CBC WITH DIFFERENTIAL/PLATELET
Abs Immature Granulocytes: 0.19 10*3/uL — ABNORMAL HIGH (ref 0.00–0.07)
Basophils Absolute: 0.1 10*3/uL (ref 0.0–0.1)
Basophils Relative: 1 %
Eosinophils Absolute: 0 10*3/uL (ref 0.0–0.5)
Eosinophils Relative: 0 %
HCT: 49.6 % (ref 39.0–52.0)
Hemoglobin: 16.6 g/dL (ref 13.0–17.0)
Immature Granulocytes: 1 %
Lymphocytes Relative: 9 %
Lymphs Abs: 1.4 10*3/uL (ref 0.7–4.0)
MCH: 29.7 pg (ref 26.0–34.0)
MCHC: 33.5 g/dL (ref 30.0–36.0)
MCV: 88.9 fL (ref 80.0–100.0)
Monocytes Absolute: 0.7 10*3/uL (ref 0.1–1.0)
Monocytes Relative: 4 %
Neutro Abs: 13.3 10*3/uL — ABNORMAL HIGH (ref 1.7–7.7)
Neutrophils Relative %: 85 %
Platelets: 316 10*3/uL (ref 150–400)
RBC: 5.58 MIL/uL (ref 4.22–5.81)
RDW: 13.8 % (ref 11.5–15.5)
WBC: 15.7 10*3/uL — ABNORMAL HIGH (ref 4.0–10.5)
nRBC: 0 % (ref 0.0–0.2)

## 2022-08-28 LAB — CBC
HCT: 46.8 % (ref 39.0–52.0)
Hemoglobin: 15.4 g/dL (ref 13.0–17.0)
MCH: 29.7 pg (ref 26.0–34.0)
MCHC: 32.9 g/dL (ref 30.0–36.0)
MCV: 90.2 fL (ref 80.0–100.0)
Platelets: 299 10*3/uL (ref 150–400)
RBC: 5.19 MIL/uL (ref 4.22–5.81)
RDW: 14 % (ref 11.5–15.5)
WBC: 20.2 10*3/uL — ABNORMAL HIGH (ref 4.0–10.5)
nRBC: 0 % (ref 0.0–0.2)

## 2022-08-28 LAB — CBG MONITORING, ED
Glucose-Capillary: 507 mg/dL (ref 70–99)
Glucose-Capillary: 463 mg/dL — ABNORMAL HIGH (ref 70–99)
Glucose-Capillary: 412 mg/dL — ABNORMAL HIGH (ref 70–99)
Glucose-Capillary: 315 mg/dL — ABNORMAL HIGH (ref 70–99)

## 2022-08-28 LAB — BETA-HYDROXYBUTYRIC ACID
Beta-Hydroxybutyric Acid: >8 mmol/L — ABNORMAL HIGH (ref 0.05–0.27)
Beta-Hydroxybutyric Acid: >8 mmol/L — ABNORMAL HIGH (ref 0.05–0.27)
Beta-Hydroxybutyric Acid: 3.72 mmol/L — ABNORMAL HIGH (ref 0.05–0.27)

## 2022-08-28 MED ORDER — METOPROLOL TARTRATE 5 MG/5ML IV SOLN: 5.0000 mg | Freq: Four times a day (QID) | INTRAVENOUS | Status: DC | PRN

## 2022-08-28 MED ORDER — CHLORHEXIDINE GLUCONATE CLOTH 2 % EX PADS
6.0000 | MEDICATED_PAD | Freq: Every day | CUTANEOUS | Status: DC
  Administered 2022-08-28 – 2022-08-29 (×2): 6 via TOPICAL

## 2022-08-28 MED ORDER — DEXTROSE IN LACTATED RINGERS 5 % IV SOLN: INTRAVENOUS | Status: DC

## 2022-08-28 MED ORDER — LACTATED RINGERS IV SOLN: INTRAVENOUS | Status: DC

## 2022-08-28 MED ORDER — ACETAMINOPHEN 650 MG RE SUPP: 650.0000 mg | Freq: Four times a day (QID) | RECTAL | Status: DC | PRN

## 2022-08-28 MED ORDER — ONDANSETRON HCL 4 MG PO TABS: 4.0000 mg | ORAL_TABLET | Freq: Four times a day (QID) | ORAL | Status: DC | PRN

## 2022-08-28 MED ORDER — DEXTROSE 50 % IV SOLN
0.0000 mL | INTRAVENOUS | Status: DC | PRN
  Filled 2022-08-28: qty 50

## 2022-08-28 MED ORDER — ALBUTEROL SULFATE (2.5 MG/3ML) 0.083% IN NEBU: 2.5000 mg | INHALATION_SOLUTION | RESPIRATORY_TRACT | Status: DC | PRN

## 2022-08-28 MED ORDER — DIPHENHYDRAMINE HCL 50 MG/ML IJ SOLN
25.0000 mg | Freq: Once | INTRAMUSCULAR | Status: AC
  Administered 2022-08-28: 25 mg via INTRAVENOUS
  Filled 2022-08-28: qty 1

## 2022-08-28 MED ORDER — ONDANSETRON HCL 4 MG/2ML IJ SOLN
4.0000 mg | Freq: Four times a day (QID) | INTRAMUSCULAR | Status: DC | PRN
  Filled 2022-08-28: qty 2

## 2022-08-28 MED ORDER — METOCLOPRAMIDE HCL 5 MG/ML IJ SOLN
10.0000 mg | Freq: Once | INTRAMUSCULAR | Status: AC
  Administered 2022-08-28: 10 mg via INTRAVENOUS
  Filled 2022-08-28: qty 2

## 2022-08-28 MED ORDER — TRAZODONE HCL 50 MG PO TABS: 25.0000 mg | ORAL_TABLET | Freq: Every evening | ORAL | Status: DC | PRN

## 2022-08-28 MED ORDER — ORAL CARE MOUTH RINSE: 15.0000 mL | OROMUCOSAL | Status: DC | PRN

## 2022-08-28 MED ORDER — INSULIN REGULAR(HUMAN) IN NACL 100-0.9 UT/100ML-% IV SOLN
INTRAVENOUS | Status: DC
  Administered 2022-08-28: 3.8 [IU]/h via INTRAVENOUS
  Administered 2022-08-28: 10.5 [IU]/h via INTRAVENOUS
  Filled 2022-08-28 (×2): qty 100

## 2022-08-28 MED ORDER — ENOXAPARIN SODIUM 40 MG/0.4ML IJ SOSY
40.0000 mg | PREFILLED_SYRINGE | INTRAMUSCULAR | Status: DC
  Administered 2022-08-28 – 2022-08-29 (×2): 40 mg via SUBCUTANEOUS
  Filled 2022-08-28 (×2): qty 0.4

## 2022-08-28 MED ORDER — ACETAMINOPHEN 325 MG PO TABS: 650.0000 mg | ORAL_TABLET | Freq: Four times a day (QID) | ORAL | Status: DC | PRN

## 2022-08-28 MED ORDER — LACTATED RINGERS IV BOLUS
2000.0000 mL | Freq: Once | INTRAVENOUS | Status: AC
  Administered 2022-08-28: 2000 mL via INTRAVENOUS

## 2022-08-28 NOTE — ED Notes (Signed)
ED TO INPATIENT HANDOFF REPORT  ED Nurse Name and Phone #: Arnetha Gula Name/Age/Gender Ricky Shaw 22 y.o. male Room/Bed: WA02/WA02  Code Status   Code Status: Full Code  Home/SNF/Other Home Patient oriented to: self, place, time, and situation Is this baseline? Yes   Triage Complete: Triage complete  Chief Complaint DKA (diabetic ketoacidosis) [E11.10]  Triage Note Patient coming from home arrived via EMS for hyperglycemia  CBG 341 BP 140/78  98% HR 110     Allergies Allergies  Allergen Reactions   Amoxicillin Hives    Level of Care/Admitting Diagnosis ED Disposition     ED Disposition  Admit   Condition  --   Comment  Hospital Area: [facility] [[id number]]  Level of Care: Stepdown [14]  Admit to SDU based on following criteria: Severe physiological/psychological symptoms:  Any diagnosis requiring assessment & intervention at least every 4 hours on an ongoing basis to obtain desired patient outcomes including stability and rehabilitation  May admit patient to [facility] or [facility] if equivalent level of care is available:: No  Covid Evaluation: Asymptomatic - no recent exposure (last 10 days) testing not required  Diagnosis: DKA (diabetic ketoacidosis) [409811]  Admitting Physician: Maryln Gottron [[id number]]  Attending Physician: Olexa.Dam, MIR Jaxson.Roy [[id number]]  Certification:: I certify this patient will need inpatient services for at least 2 midnights  Estimated Length of Stay: 3          B Medical/Surgery History Past Medical History:  Diagnosis Date   Diabetes mellitus 11/14/2011   Diabetes mellitus without complication (HCC)    Phreesia 03/03/2020   Past Surgical History:  Procedure Laterality Date   nasal cauterization     NASAL HEMORRHAGE CONTROL  05/14/2006     A IV Location/Drains/Wounds Patient Lines/Drains/Airways Status     Active Line/Drains/Airways     Name Placement date Placement time  Site Days   Peripheral IV 08/28/22 20 G Anterior;Distal;Left;Upper Arm 08/28/22  0755  Arm  less than 1   Peripheral IV 08/28/22 Right Antecubital 08/28/22  1020  Antecubital  less than 1            Intake/Output Last 24 hours No intake or output data in the 24 hours ending 08/28/22 1252  Labs/Imaging Results for orders placed or performed during the hospital encounter of 08/28/22 (from the past 48 hour(s))  CBG monitoring, ED     Status: Abnormal   Collection Time: 08/28/22  7:53 AM  Result Value Ref Range   Glucose-Capillary 507 (HH) 70 - 99 mg/dL    Comment: Glucose reference range applies only to samples taken after fasting for at least 8 hours.   Comment 1 Notify RN   CBC with Differential     Status: Abnormal   Collection Time: 08/28/22  7:59 AM  Result Value Ref Range   WBC 15.7 (H) 4.0 - 10.5 K/uL   RBC 5.58 4.22 - 5.81 MIL/uL   Hemoglobin 16.6 13.0 - 17.0 g/dL   HCT 65.7 84.6 - 96.2 %   MCV 88.9 80.0 - 100.0 fL   MCH 29.7 26.0 - 34.0 pg   MCHC 33.5 30.0 - 36.0 g/dL   RDW 95.2 84.1 - 32.4 %   Platelets 316 150 - 400 K/uL   nRBC 0.0 0.0 - 0.2 %   Neutrophils Relative % 85 %   Neutro Abs 13.3 (H) 1.7 - 7.7 K/uL   Lymphocytes Relative 9 %   Lymphs Abs 1.4 0.7 -  4.0 K/uL   Monocytes Relative 4 %   Monocytes Absolute 0.7 0.1 - 1.0 K/uL   Eosinophils Relative 0 %   Eosinophils Absolute 0.0 0.0 - 0.5 K/uL   Basophils Relative 1 %   Basophils Absolute 0.1 0.0 - 0.1 K/uL   Immature Granulocytes 1 %   Abs Immature Granulocytes 0.19 (H) 0.00 - 0.07 K/uL    Comment: Performed at [facility], [address] [place], [place] [address]  Basic metabolic panel     Status: Abnormal   Collection Time: 08/28/22  7:59 AM  Result Value Ref Range   Sodium 128 (L) 135 - 145 mmol/L    Comment: ELECTROLYTES REPEATED TO VERIFY   Potassium 6.1 (H) 3.5 - 5.1 mmol/L    Comment: MODERATE HEMOLYSIS   Chloride 98 98 - 111 mmol/L    Comment: ELECTROLYTES REPEATED TO  VERIFY   CO2 9 (L) 22 - 32 mmol/L    Comment: ELECTROLYTES REPEATED TO VERIFY   Glucose, Bld 485 (H) 70 - 99 mg/dL    Comment: Glucose reference range applies only to samples taken after fasting for at least 8 hours.   BUN 18 6 - 20 mg/dL   Creatinine, Ser 5.64 (H) 0.61 - 1.24 mg/dL   Calcium 9.2 8.9 - 33.2 mg/dL   GFR, Estimated >95 >18 mL/min    Comment: (NOTE) Calculated using the CKD-EPI Creatinine Equation (2021)    Anion gap 21 (H) 5 - 15    Comment: ELECTROLYTES REPEATED TO VERIFY Performed at [facility], [address], [place], [place] [address]   Blood gas, venous (at [facility] and [facility])     Status: Abnormal   Collection Time: 08/28/22  7:59 AM  Result Value Ref Range   pH, Ven 7.09 (LL) 7.25 - 7.43    Comment: CRITICAL RESULT CALLED TO, READ BACK BY AND VERIFIED WITH: Abrahm Mancia,S. RN AT 0830 08/28/22 MULLINS,T    pCO2, Ven 31 (L) 44 - 60 mmHg   pO2, Ven 49 (H) 32 - 45 mmHg   Bicarbonate 9.4 (L) 20.0 - 28.0 mmol/L   Acid-base deficit 19.3 (H) 0.0 - 2.0 mmol/L   O2 Saturation 79 %   Patient temperature 37.0     Comment: Performed at [facility], [address] [place], [place] [address]  Beta-hydroxybutyric acid     Status: Abnormal   Collection Time: 08/28/22  8:00 AM  Result Value Ref Range   Beta-Hydroxybutyric Acid >8.00 (H) 0.05 - 0.27 mmol/L    Comment: RESULT CONFIRMED BY MANUAL DILUTION Performed at [facility], [address] [place], [place] [address]   Urinalysis, Routine w reflex microscopic -Urine, Clean Catch     Status: Abnormal   Collection Time: 08/28/22  8:57 AM  Result Value Ref Range   Color, Urine STRAW (A) YELLOW   APPearance CLEAR CLEAR   Specific Gravity, Urine 1.024 1.005 - 1.030   pH 5.0 5.0 - 8.0   Glucose, UA >=500 (A) NEGATIVE mg/dL   Hgb urine dipstick SMALL (A) NEGATIVE   Bilirubin Urine NEGATIVE NEGATIVE   Ketones, ur 80 (A) NEGATIVE mg/dL   Protein, ur 323 (A) NEGATIVE mg/dL    Nitrite NEGATIVE NEGATIVE   Leukocytes,Ua NEGATIVE NEGATIVE   RBC / HPF 0-5 0 - 5 RBC/hpf   WBC, UA 0-5 0 - 5 WBC/hpf   Bacteria, UA RARE (A) NONE SEEN   Squamous Epithelial / HPF 0-5 0 - 5 /HPF  Mucus PRESENT     Comment: Performed at [facility], [address], [place], [place] [address]  CBG monitoring, ED     Status: Abnormal   Collection Time: 08/28/22 10:20 AM  Result Value Ref Range   Glucose-Capillary 463 (H) 70 - 99 mg/dL    Comment: Glucose reference range applies only to samples taken after fasting for at least 8 hours.  Basic metabolic panel     Status: Abnormal   Collection Time: 08/28/22 11:02 AM  Result Value Ref Range   Sodium 130 (L) 135 - 145 mmol/L   Potassium 6.1 (H) 3.5 - 5.1 mmol/L    Comment: MODERATE HEMOLYSIS   Chloride 103 98 - 111 mmol/L   CO2 <7 (L) 22 - 32 mmol/L   Glucose, Bld 460 (H) 70 - 99 mg/dL    Comment: Glucose reference range applies only to samples taken after fasting for at least 8 hours.   BUN 16 6 - 20 mg/dL   Creatinine, Ser 2.95 (H) 0.61 - 1.24 mg/dL   Calcium 8.4 (L) 8.9 - 10.3 mg/dL   GFR, Estimated >62 >13 mL/min    Comment: (NOTE) Calculated using the CKD-EPI Creatinine Equation (2021)    Anion gap NOT CALCULATED 5 - 15    Comment: Performed at [facility], [address], [place], [place] [address]  CBC     Status: Abnormal   Collection Time: 08/28/22 11:02 AM  Result Value Ref Range   WBC 20.2 (H) 4.0 - 10.5 K/uL   RBC 5.19 4.22 - 5.81 MIL/uL   Hemoglobin 15.4 13.0 - 17.0 g/dL   HCT 84.6 96.2 - 95.2 %   MCV 90.2 80.0 - 100.0 fL   MCH 29.7 26.0 - 34.0 pg   MCHC 32.9 30.0 - 36.0 g/dL   RDW 84.1 32.4 - 40.1 %   Platelets 299 150 - 400 K/uL   nRBC 0.0 0.0 - 0.2 %    Comment: Performed at [facility], [address] [place], [place] [address]  CBG monitoring, ED     Status: Abnormal   Collection Time: 08/28/22 11:37 AM  Result Value Ref Range    Glucose-Capillary 412 (H) 70 - 99 mg/dL    Comment: Glucose reference range applies only to samples taken after fasting for at least 8 hours.  CBG monitoring, ED     Status: Abnormal   Collection Time: 08/28/22 12:44 PM  Result Value Ref Range   Glucose-Capillary 315 (H) 70 - 99 mg/dL    Comment: Glucose reference range applies only to samples taken after fasting for at least 8 hours.   No results found.  Pending Labs Unresulted Labs (From admission, onward)     Start     Ordered   09/04/22 0500  Creatinine, serum  (enoxaparin (LOVENOX)    CrCl >/= 30 ml/min)  Weekly,   R     Comments: while on enoxaparin therapy    08/28/22 1004   08/29/22 0500  CBC  Tomorrow morning,   R        08/28/22 1004   08/28/22 1216  Basic metabolic panel  Once,   R        08/28/22 1216   08/28/22 1200  Basic metabolic panel  (Diabetes Ketoacidosis (DKA))  STAT Now then every 4 hours ,   STAT      08/28/22 0936   08/28/22 1200  Beta-hydroxybutyric acid  (Diabetes Ketoacidosis (DKA))  Now then  every 8 hours,   STAT (with URGENT occurrences)      08/28/22 0936            Vitals/Pain Today's Vitals   08/28/22 0742 08/28/22 0743 08/28/22 1200 08/28/22 1203  BP: (!) 140/81  (!) 142/73   Pulse: (!) 102  (!) 121   Resp: 18     Temp: 98.9 F (37.2 C)   98.2 F (36.8 C)  TempSrc: Oral   Oral  SpO2: 98%  98%   PainSc:  7       Isolation Precautions No active isolations  Medications Medications  insulin regular, human (MYXREDLIN) 100 units/ 100 mL infusion (11.5 Units/hr Intravenous Rate/Dose Change 08/28/22 1247)  lactated ringers infusion ( Intravenous New Bag/Given 08/28/22 1029)  dextrose 5 % in lactated ringers infusion (has no administration in time range)  dextrose 50 % solution 0-50 mL (has no administration in time range)  enoxaparin (LOVENOX) injection 40 mg (40 mg Subcutaneous Given 08/28/22 1246)  acetaminophen (TYLENOL) tablet 650 mg (has no administration in time range)    Or   acetaminophen (TYLENOL) suppository 650 mg (has no administration in time range)  traZODone (DESYREL) tablet 25 mg (has no administration in time range)  ondansetron (ZOFRAN) tablet 4 mg (has no administration in time range)    Or  ondansetron (ZOFRAN) injection 4 mg (has no administration in time range)  albuterol (PROVENTIL) (2.5 MG/3ML) 0.083% nebulizer solution 2.5 mg (has no administration in time range)  metoprolol tartrate (LOPRESSOR) injection 5 mg (has no administration in time range)  lactated ringers bolus 2,000 mL (0 mLs Intravenous Stopped 08/28/22 1034)  metoCLOPramide (REGLAN) injection 10 mg (10 mg Intravenous Given 08/28/22 0759)  diphenhydrAMINE (BENADRYL) injection 25 mg (25 mg Intravenous Given 08/28/22 0759)    Mobility walks     Focused Assessments Cardiac Assessment Handoff:    No results found for: "CKTOTAL", "CKMB", "CKMBINDEX", "TROPONINI" No results found for: "DDIMER" Does the Patient currently have chest pain? No    R Recommendations: See Admitting Provider Note  Report given to:   Additional Notes:

## 2022-08-28 NOTE — H&P (Signed)
History and Physical  Ricky Shaw ZOX:[id number] DOB: 16-Nov-2000 DOA: 08/28/2022  PCP: [name], MD   Chief Complaint: vomiting   HPI: Ricky Shaw is a 22 y.o. male with medical history significant for type 1 diabetes with t:slim pump and Dexcom sensor being admitted to the hospital with diabetic ketoacidosis.  Patient was in his usual state of health until early this morning, he was awakened at 4 AM and is insulin pump was beeping, saying there was a malfunction.  He changes that pump site, but soon started to feel unwell, with nausea and vomiting.  He was afraid he was going into diabetic ketoacidosis, and called EMS.  He denies any cough, shortness of breath, fevers.  No blood in his emesis.  Mother is at the bedside this morning, states he has been compliant with his pump.  Pump and glucose monitor were removed by EMS.  ED Course: Upon arrival in the ER, he was hypertensive and tachycardic.  Lab work reveals leukocytosis, hyponatremia, potassium 6.1, initial blood sugar 507.  He was given LR bolus, started on maintenance IV fluids, has been placed on insulin drip.  Currently comfortable, with no complaints of nausea or abdominal pain.  Review of Systems: Please see HPI for pertinent positives and negatives. A complete 10 system review of systems are otherwise negative.  Past Medical History:  Diagnosis Date   Diabetes mellitus 11/14/2011   Diabetes mellitus without complication (HCC)    Phreesia 03/03/2020   Past Surgical History:  Procedure Laterality Date   nasal cauterization     NASAL HEMORRHAGE CONTROL  05/14/2006    Social History:  reports that he has never smoked. He has never used smokeless tobacco. He reports current drug use. Drug: Marijuana. He reports that he does not drink alcohol.   Allergies  Allergen Reactions   Amoxicillin Hives    Family History  Problem Relation Age of Onset   Asthma Maternal Aunt    Cancer Maternal Grandfather     Diabetes Paternal Grandmother    Hypertension Other    Thyroid disease Neg Hx      Prior to Admission medications   Medication Sig Start Date End Date Taking? Authorizing Provider  Continuous Blood Gluc Sensor (DEXCOM G6 SENSOR) MISC 1 Device by Does not apply route continuous. 12/12/21   Gretchen Short, NP  Continuous Blood Gluc Transmit (DEXCOM G6 TRANSMITTER) MISC Change every 90 days 12/12/21   Gretchen Short, NP  glucagon 1 MG injection Use for Severe Hypoglycemia, if unresponsive, unable to swallow, unconscious and/or has seizure 09/05/16   Gretchen Short, NP  insulin aspart (NOVOLOG) 100 UNIT/ML FlexPen Inject 10 Units into the skin 3 (three) times daily with meals. ONLY IN CASE OF PUMP FAILURE 12/11/21   Evlyn Kanner, MD  insulin glargine (LANTUS) 100 UNIT/ML Solostar Pen Inject 35 Units into the skin daily. Inject 35 units in the skin ONLY in case of pump failure 12/11/21   Mapp, Tavien, MD  insulin lispro (HUMALOG) 100 UNIT/ML injection USE 300 UNITS IN INSULIN PUMP EVERY 48 HOURS 01/09/22   Gretchen Short, NP  Insulin Pen Needle 32G X 4 MM MISC 1 applicator by Does not apply route 3 (three) times daily. Inject up to 5 x per day. 12/12/21   Gretchen Short, NP    Physical Exam: BP (!) 140/81 (BP Location: Right Arm)   Pulse (!) 102   Temp 98.9 F (37.2 C) (Oral)   Resp 18   SpO2 98%   General:  Alert, oriented, calm, in no acute distress, looks tired but not acutely ill.  Thin, well-formed, well-developed, young man.  Mother at the bedside. Eyes: EOMI, clear conjuctivae, white sclerea Neck: supple, no masses, trachea mildline  Cardiovascular: RRR, no murmurs or rubs, no peripheral edema  Respiratory: clear to auscultation bilaterally, no wheezes, no crackles  Abdomen: soft, nontender, nondistended, normal bowel tones heard  Skin: dry, no rashes  Musculoskeletal: no joint effusions, normal range of motion  Psychiatric: appropriate affect, normal speech  Neurologic:  extraocular muscles intact, clear speech, moving all extremities with intact sensorium          Labs on Admission:  Basic Metabolic Panel: Recent Labs  Lab 08/28/22 0759  NA 128*  K 6.1*  CL 98  CO2 9*  GLUCOSE 485*  BUN 18  CREATININE 1.34*  CALCIUM 9.2   Liver Function Tests: No results for input(s): "AST", "ALT", "ALKPHOS", "BILITOT", "PROT", "ALBUMIN" in the last 168 hours. No results for input(s): "LIPASE", "AMYLASE" in the last 168 hours. No results for input(s): "AMMONIA" in the last 168 hours. CBC: Recent Labs  Lab 08/28/22 0759  WBC 15.7*  NEUTROABS 13.3*  HGB 16.6  HCT 49.6  MCV 88.9  PLT 316   Cardiac Enzymes: No results for input(s): "CKTOTAL", "CKMB", "CKMBINDEX", "TROPONINI" in the last 168 hours.  BNP (last 3 results) No results for input(s): "BNP" in the last 8760 hours.  ProBNP (last 3 results) No results for input(s): "PROBNP" in the last 8760 hours.  CBG: Recent Labs  Lab 08/28/22 0753  GLUCAP 507*    Radiological Exams on Admission: No results found.  Assessment/Plan Principal Problem:   DKA (diabetic ketoacidosis)-with anion gap metabolic acidosis, elevated beta hydroxybutyrate, and the attendant electrolyte abnormalities. -Inpatient admission to SDU -N.p.o. -Continue IV fluids -BMP every 4 hours per drip protocol -Anticipate patient will remain n.p.o. on insulin drip until anion gap closes x 2  Active Problems:   Uncontrolled type 1 diabetes mellitus with hyperglycemia He does have a documented history of noncompliance with diabetes treatment   AKI (acute kidney injury)-creatinine up to 1.3, from normal baseline likely due to dehydration   Hyperkalemia-will recheck stat BMP now, anticipate this will improve with hydration and insulin administration   Pseudohyponatremia-due to hyperglycemia, will follow  DVT prophylaxis: Lovenox     Code Status: Prior  Consults called: None  Admission status: The appropriate patient status  for this patient is INPATIENT. Inpatient status is judged to be reasonable and necessary in order to provide the required intensity of service to ensure the patient's safety. The patient's presenting symptoms, physical exam findings, and initial radiographic and laboratory data in the context of their chronic comorbidities is felt to place them at high risk for further clinical deterioration. Furthermore, it is not anticipated that the patient will be medically stable for discharge from the hospital within 2 midnights of admission.    I certify that at the point of admission it is my clinical judgment that the patient will require inpatient hospital care spanning beyond 2 midnights from the point of admission due to high intensity of service, high risk for further deterioration and high frequency of surveillance required   Time spent: 45 minutes  Rodman Recupero Sharlette Dense MD [facility] Pager [phone]  If 7PM-7AM, please contact night-coverage [email or web address] Password [facility]  08/28/2022, 10:05 AM

## 2022-08-28 NOTE — Inpatient Diabetes Management (Addendum)
Inpatient Diabetes Program Recommendations  AACE/ADA: New Consensus Statement on Inpatient Glycemic Control (2015)  Target Ranges:  Prepandial:   less than 140 mg/dL      Peak postprandial:   less than 180 mg/dL (1-2 hours)      Critically ill patients:  140 - 180 mg/dL   Lab Results  Component Value Date   GLUCAP 507 (HH) 08/28/2022   HGBA1C 10.8 (A) 03/13/2022    Review of Glycemic Control  Latest Reference Range & Units 08/28/22 07:53  Glucose-Capillary 70 - 99 mg/dL 161 (HH)  (HH): Data is critically high  Diabetes history: DM1(does not make insulin.  Needs correction, basal and meal coverage)  Outpatient Diabetes medications:  T-Slim insulin pump with the Dexcom CGM Basal Rates 12AM 1.90   3am 2.0   6am 2.0   8am 2.30   9pm   2.20     52.2  units per day    Insulin to Carbohydrate Ratio 12AM 10  3am 10  6am 6  8am 5   9pm 5    Target Blood Glucose ISF 1:30 >120 day and >180 night.   Current orders for Inpatient glycemic control: IV insulin  Spoke with patient at bedside along with admitting MD.  Called EMS early this morning for hyperglycemia.  EMS removed insulin pump and Dexcom at home.  Mom is at bedside.  Patient states his pump was alarming and warning of an occulusion.  He states he changed his site at that time.  He does have back up rapid insulin at home for pump failure.  Will speak with him later when he is more alert.    ENDO-Spencer Dalbert [name] with [facility].  Last visit was 04/17/22.  He had an appointment 2 weeks ago but was rescheduled for May due to provider being out sick.    Please consider transitioning patient back to pump once MD is ready and acidosis has cleared.  Mom is aware to bring insulin and pump supplies to the hospital.   Will continue to follow while inpatient.  Thank you, Dulce Sellar, MSN, CDCES Diabetes Coordinator Inpatient Diabetes Program [phone] (team pager from 8a-5p)

## 2022-08-28 NOTE — ED Triage Notes (Signed)
Patient coming from home arrived via EMS for hyperglycemia  CBG 341 BP 140/78  98% HR 110

## 2022-08-28 NOTE — ED Provider Notes (Signed)
[place] EMERGENCY DEPARTMENT AT [facility] Provider Note   CSN: [id number] Arrival date & time: 08/28/22  4098     History  Chief Complaint  Patient presents with   Hyperglycemia    Ricky Shaw is a 22 y.o. male.  22 yo M with a chief complaints of concerned that he is in diabetic ketoacidosis.  He thinks it started a couple hours ago.  He said yesterday was fine.  Denies any change to his diabetes regimen.  Denies any difficulty taking treatment.  He denies cough congestion or fever.  Having nausea and vomiting consistent with his DKA previously.   Hyperglycemia      Home Medications Prior to Admission medications   Medication Sig Start Date End Date Taking? Authorizing Provider  Continuous Blood Gluc Sensor (DEXCOM G6 SENSOR) MISC 1 Device by Does not apply route continuous. 12/12/21   Gretchen Short, NP  Continuous Blood Gluc Transmit (DEXCOM G6 TRANSMITTER) MISC Change every 90 days 12/12/21   Gretchen Short, NP  glucagon 1 MG injection Use for Severe Hypoglycemia, if unresponsive, unable to swallow, unconscious and/or has seizure 09/05/16   Gretchen Short, NP  insulin aspart (NOVOLOG) 100 UNIT/ML FlexPen Inject 10 Units into the skin 3 (three) times daily with meals. ONLY IN CASE OF PUMP FAILURE 12/11/21   Evlyn Kanner, MD  insulin glargine (LANTUS) 100 UNIT/ML Solostar Pen Inject 35 Units into the skin daily. Inject 35 units in the skin ONLY in case of pump failure 12/11/21   Mapp, Tavien, MD  insulin lispro (HUMALOG) 100 UNIT/ML injection USE 300 UNITS IN INSULIN PUMP EVERY 48 HOURS 01/09/22   Gretchen Short, NP  Insulin Pen Needle 32G X 4 MM MISC 1 applicator by Does not apply route 3 (three) times daily. Inject up to 5 x per day. 12/12/21   Gretchen Short, NP      Allergies    Amoxicillin    Review of Systems   Review of Systems  Physical Exam Updated Vital Signs BP (!) 140/81 (BP Location: Right Arm)   Pulse (!) 102   Temp 98.9 F  (37.2 C) (Oral)   Resp 18   SpO2 98%  Physical Exam Vitals and nursing note reviewed.  Constitutional:      Appearance: He is well-developed.  HENT:     Head: Normocephalic and atraumatic.  Eyes:     Pupils: Pupils are equal, round, and reactive to light.  Neck:     Vascular: No JVD.  Cardiovascular:     Rate and Rhythm: Normal rate and regular rhythm.     Heart sounds: No murmur heard.    No friction rub. No gallop.  Pulmonary:     Effort: No respiratory distress.     Breath sounds: No wheezing.  Abdominal:     General: There is no distension.     Tenderness: There is no abdominal tenderness. There is no guarding or rebound.  Musculoskeletal:        General: Normal range of motion.     Cervical back: Normal range of motion and neck supple.  Skin:    Coloration: Skin is not pale.     Findings: No rash.  Neurological:     Mental Status: He is alert and oriented to person, place, and time.  Psychiatric:        Behavior: Behavior normal.     ED Results / Procedures / Treatments   Labs (all labs ordered are listed, but only abnormal results  are displayed) Labs Reviewed  CBC WITH DIFFERENTIAL/PLATELET - Abnormal; Notable for the following components:      Result Value   WBC 15.7 (*)    Neutro Abs 13.3 (*)    Abs Immature Granulocytes 0.19 (*)    All other components within normal limits  BASIC METABOLIC PANEL - Abnormal; Notable for the following components:   Sodium 128 (*)    Potassium 6.1 (*)    CO2 9 (*)    Glucose, Bld 485 (*)    Creatinine, Ser 1.34 (*)    Anion gap 21 (*)    All other components within normal limits  URINALYSIS, ROUTINE W REFLEX MICROSCOPIC - Abnormal; Notable for the following components:   Color, Urine STRAW (*)    Glucose, UA >=500 (*)    Hgb urine dipstick SMALL (*)    Ketones, ur 80 (*)    Protein, ur 100 (*)    Bacteria, UA RARE (*)    All other components within normal limits  BETA-HYDROXYBUTYRIC ACID - Abnormal; Notable for the  following components:   Beta-Hydroxybutyric Acid >8.00 (*)    All other components within normal limits  BLOOD GAS, VENOUS - Abnormal; Notable for the following components:   pH, Ven 7.09 (*)    pCO2, Ven 31 (*)    pO2, Ven 49 (*)    Bicarbonate 9.4 (*)    Acid-base deficit 19.3 (*)    All other components within normal limits  CBG MONITORING, ED - Abnormal; Notable for the following components:   Glucose-Capillary 507 (*)    All other components within normal limits  CBG MONITORING, ED - Abnormal; Notable for the following components:   Glucose-Capillary 463 (*)    All other components within normal limits  BASIC METABOLIC PANEL  BASIC METABOLIC PANEL  BASIC METABOLIC PANEL  BETA-HYDROXYBUTYRIC ACID  BETA-HYDROXYBUTYRIC ACID  BASIC METABOLIC PANEL  CBC    EKG None  Radiology No results found.  Procedures .Critical Care  Performed by: Melene Plan, DO Authorized by: Melene Plan, DO   Critical care provider statement:    Critical care time (minutes):  35   Critical care time was exclusive of:  Separately billable procedures and treating other patients   Critical care was time spent personally by me on the following activities:  Development of treatment plan with patient or surrogate, discussions with consultants, evaluation of patient's response to treatment, examination of patient, ordering and review of laboratory studies, ordering and review of radiographic studies, ordering and performing treatments and interventions, pulse oximetry, re-evaluation of patient's condition and review of old charts   Care discussed with: admitting provider       Medications Ordered in ED Medications  insulin regular, human (MYXREDLIN) 100 units/ 100 mL infusion (10.5 Units/hr Intravenous New Bag/Given 08/28/22 1026)  lactated ringers infusion ( Intravenous New Bag/Given 08/28/22 1029)  dextrose 5 % in lactated ringers infusion (has no administration in time range)  dextrose 50 % solution  0-50 mL (has no administration in time range)  enoxaparin (LOVENOX) injection 40 mg (has no administration in time range)  acetaminophen (TYLENOL) tablet 650 mg (has no administration in time range)    Or  acetaminophen (TYLENOL) suppository 650 mg (has no administration in time range)  traZODone (DESYREL) tablet 25 mg (has no administration in time range)  ondansetron (ZOFRAN) tablet 4 mg (has no administration in time range)    Or  ondansetron (ZOFRAN) injection 4 mg (has no administration in time range)  albuterol (PROVENTIL) (2.5 MG/3ML) 0.083% nebulizer solution 2.5 mg (has no administration in time range)  metoprolol tartrate (LOPRESSOR) injection 5 mg (has no administration in time range)  lactated ringers bolus 2,000 mL (0 mLs Intravenous Stopped 08/28/22 1034)  metoCLOPramide (REGLAN) injection 10 mg (10 mg Intravenous Given 08/28/22 0759)  diphenhydrAMINE (BENADRYL) injection 25 mg (25 mg Intravenous Given 08/28/22 0759)    ED Course/ Medical Decision Making/ A&P                             Medical Decision Making Amount and/or Complexity of Data Reviewed Labs: ordered.  Risk Prescription drug management. Decision regarding hospitalization.   22 yo M with concerns that maybe he is in DKA.  Has only had symptoms for 2 hours.  He is complaining of nausea and vomiting.  Will obtain a laboratory evaluation to assess.  Patient does appear to be in diabetic ketoacidosis, blood sugar greater than 400, beta hydroxybutyric acid greater than 8 pH is 7.09.  Bicarb of 9.  Started on insulin infusion.  Will discuss with medicine for admission.  The patients results and plan were reviewed and discussed.   Any x-rays performed were independently reviewed by myself.   Differential diagnosis were considered with the presenting HPI.  Medications  insulin regular, human (MYXREDLIN) 100 units/ 100 mL infusion (10.5 Units/hr Intravenous New Bag/Given 08/28/22 1026)  lactated ringers infusion  ( Intravenous New Bag/Given 08/28/22 1029)  dextrose 5 % in lactated ringers infusion (has no administration in time range)  dextrose 50 % solution 0-50 mL (has no administration in time range)  enoxaparin (LOVENOX) injection 40 mg (has no administration in time range)  acetaminophen (TYLENOL) tablet 650 mg (has no administration in time range)    Or  acetaminophen (TYLENOL) suppository 650 mg (has no administration in time range)  traZODone (DESYREL) tablet 25 mg (has no administration in time range)  ondansetron (ZOFRAN) tablet 4 mg (has no administration in time range)    Or  ondansetron (ZOFRAN) injection 4 mg (has no administration in time range)  albuterol (PROVENTIL) (2.5 MG/3ML) 0.083% nebulizer solution 2.5 mg (has no administration in time range)  metoprolol tartrate (LOPRESSOR) injection 5 mg (has no administration in time range)  lactated ringers bolus 2,000 mL (0 mLs Intravenous Stopped 08/28/22 1034)  metoCLOPramide (REGLAN) injection 10 mg (10 mg Intravenous Given 08/28/22 0759)  diphenhydrAMINE (BENADRYL) injection 25 mg (25 mg Intravenous Given 08/28/22 0759)    Vitals:   08/28/22 0742  BP: (!) 140/81  Pulse: (!) 102  Resp: 18  Temp: 98.9 F (37.2 C)  TempSrc: Oral  SpO2: 98%    Final diagnoses:  Diabetic ketoacidosis without coma associated with type 1 diabetes mellitus    Admission/ observation were discussed with the admitting physician, patient and/or family and they are comfortable with the plan.          Final Clinical Impression(s) / ED Diagnoses Final diagnoses:  Diabetic ketoacidosis without coma associated with type 1 diabetes mellitus    Rx / DC Orders ED Discharge Orders     None         Melene Plan, DO 08/28/22 1047

## 2022-08-29 DIAGNOSIS — E1010 Type 1 diabetes mellitus with ketoacidosis without coma: Secondary | ICD-10-CM | POA: Diagnosis not present

## 2022-08-29 LAB — CBC
HCT: 44.5 % (ref 39.0–52.0)
Hemoglobin: 15.2 g/dL (ref 13.0–17.0)
MCH: 29.5 pg (ref 26.0–34.0)
MCHC: 34.2 g/dL (ref 30.0–36.0)
MCV: 86.2 fL (ref 80.0–100.0)
Platelets: 276 10*3/uL (ref 150–400)
RBC: 5.16 MIL/uL (ref 4.22–5.81)
RDW: 14 % (ref 11.5–15.5)
WBC: 9.7 10*3/uL (ref 4.0–10.5)
nRBC: 0 % (ref 0.0–0.2)

## 2022-08-29 LAB — GLUCOSE, CAPILLARY
Glucose-Capillary: 123 mg/dL — ABNORMAL HIGH (ref 70–99)
Glucose-Capillary: 124 mg/dL — ABNORMAL HIGH (ref 70–99)
Glucose-Capillary: 127 mg/dL — ABNORMAL HIGH (ref 70–99)
Glucose-Capillary: 132 mg/dL — ABNORMAL HIGH (ref 70–99)
Glucose-Capillary: 139 mg/dL — ABNORMAL HIGH (ref 70–99)
Glucose-Capillary: 141 mg/dL — ABNORMAL HIGH (ref 70–99)
Glucose-Capillary: 143 mg/dL — ABNORMAL HIGH (ref 70–99)
Glucose-Capillary: 151 mg/dL — ABNORMAL HIGH (ref 70–99)
Glucose-Capillary: 152 mg/dL — ABNORMAL HIGH (ref 70–99)
Glucose-Capillary: 154 mg/dL — ABNORMAL HIGH (ref 70–99)
Glucose-Capillary: 163 mg/dL — ABNORMAL HIGH (ref 70–99)
Glucose-Capillary: 187 mg/dL — ABNORMAL HIGH (ref 70–99)

## 2022-08-29 LAB — BASIC METABOLIC PANEL
Anion gap: 12 (ref 5–15)
Anion gap: 9 (ref 5–15)
BUN: 14 mg/dL (ref 6–20)
BUN: 14 mg/dL (ref 6–20)
CO2: 18 mmol/L — ABNORMAL LOW (ref 22–32)
CO2: 19 mmol/L — ABNORMAL LOW (ref 22–32)
Calcium: 9 mg/dL (ref 8.9–10.3)
Calcium: 9.2 mg/dL (ref 8.9–10.3)
Chloride: 108 mmol/L (ref 98–111)
Chloride: 111 mmol/L (ref 98–111)
Creatinine, Ser: 0.96 mg/dL (ref 0.61–1.24)
Creatinine, Ser: 1.02 mg/dL (ref 0.61–1.24)
GFR, Estimated: >60 mL/min (ref >60)
GFR, Estimated: >60 mL/min (ref >60)
Glucose, Bld: 132 mg/dL — ABNORMAL HIGH (ref 70–99)
Glucose, Bld: 152 mg/dL — ABNORMAL HIGH (ref 70–99)
Potassium: 4.1 mmol/L (ref 3.5–5.1)
Potassium: 4.2 mmol/L (ref 3.5–5.1)
Sodium: 138 mmol/L (ref 135–145)
Sodium: 139 mmol/L (ref 135–145)

## 2022-08-29 LAB — BETA-HYDROXYBUTYRIC ACID: Beta-Hydroxybutyric Acid: 2.19 mmol/L — ABNORMAL HIGH (ref 0.05–0.27)

## 2022-08-29 MED ORDER — INSULIN PUMP: Freq: Three times a day (TID) | SUBCUTANEOUS | Status: DC

## 2022-08-29 MED ORDER — INSULIN ASPART 100 UNIT/ML IJ SOLN: 0.0000 [IU] | Freq: Every day | INTRAMUSCULAR | Status: DC

## 2022-08-29 MED ORDER — INSULIN LISPRO (1 UNIT DIAL) 100 UNIT/ML (KWIKPEN): 10.0000 [IU] | PEN_INJECTOR | Freq: Three times a day (TID) | SUBCUTANEOUS | 1 refills | Status: DC

## 2022-08-29 MED ORDER — INSULIN ASPART 100 UNIT/ML IJ SOLN
0.0000 [IU] | Freq: Three times a day (TID) | INTRAMUSCULAR | Status: DC
  Administered 2022-08-29: 2 [IU] via SUBCUTANEOUS

## 2022-08-29 MED ORDER — INSULIN GLARGINE 100 UNIT/ML SOLOSTAR PEN: 35.0000 [IU] | PEN_INJECTOR | Freq: Every day | SUBCUTANEOUS | 11 refills | Status: DC

## 2022-08-29 MED ORDER — INSULIN ASPART 100 UNIT/ML IJ SOLN
4.0000 [IU] | Freq: Three times a day (TID) | INTRAMUSCULAR | Status: DC
  Administered 2022-08-29: 4 [IU] via SUBCUTANEOUS

## 2022-08-29 MED ORDER — DEXCOM G6 TRANSMITTER MISC: 6 refills | Status: DC

## 2022-08-29 MED ORDER — INSULIN DETEMIR 100 UNIT/ML ~~LOC~~ SOLN
10.0000 [IU] | Freq: Two times a day (BID) | SUBCUTANEOUS | Status: DC
  Administered 2022-08-29: 10 [IU] via SUBCUTANEOUS
  Filled 2022-08-29 (×2): qty 0.1

## 2022-08-29 NOTE — Progress Notes (Signed)
  Transition of Care (TOC) Screening Note   Patient Details  Name: ALTUS ZAINO Date of Birth: [birth year]   Transition of Care [facility]) CM/SW Contact:    Lavenia Atlas, RN Phone Number: 08/29/2022, 4:34 PM    Transition of Care Department [facility]) has reviewed patient and no TOC needs have been identified at this time. We will continue to monitor patient advancement through interdisciplinary progression rounds. If new patient transition needs arise, please place a TOC consult.

## 2022-08-29 NOTE — Inpatient Diabetes Management (Signed)
Inpatient Diabetes Program Recommendations  AACE/ADA: New Consensus Statement on Inpatient Glycemic Control (2015)  Target Ranges:  Prepandial:   less than 140 mg/dL      Peak postprandial:   less than 180 mg/dL (1-2 hours)      Critically ill patients:  140 - 180 mg/dL   Lab Results  Component Value Date   GLUCAP 187 (H) 08/29/2022   HGBA1C 10.8 (A) 03/13/2022    Review of Glycemic Control  Latest Reference Range & Units 08/29/22 07:44 08/29/22 08:40 08/29/22 10:38 08/29/22 12:32  Glucose-Capillary 70 - 99 mg/dL 960 (H) 454 (H) 098 (H) 187 (H)   Diabetes history: DM1(does not make insulin.  Needs correction, basal and meal coverage)  Outpatient Diabetes medications:  T-Slim insulin pump with the Dexcom CGM Basal Rates 12AM 1.90   3am 2.0   6am 2.0   8am 2.30   9pm   2.20     52.2  units per day    Insulin to Carbohydrate Ratio 12AM 10  3am 10  6am 6  8am 5   9pm 5    Target Blood Glucose ISF 1:30 >120 day and >180 night.   Current orders for Inpatient glycemic control:  Levemir 10 units bid Novolog 0-9 units tid + hs Novolog 4 units tid meal coverage  Spoke with patient at bedside pt insulin pump needs charging. Pts mom will get charger and CGM (dexcom G6) from home for pt to apply insulin pump this evening. Pt has plenty of supplies for application at bedside. Reviewed pt A1c. Pt needs to follow up with his Endocrinologist.  Thanks,  Christena Deem RN, MSN, BC-ADM Inpatient Diabetes Coordinator Team Pager [phone] (8a-5p)

## 2022-08-29 NOTE — Plan of Care (Signed)

## 2022-08-29 NOTE — Progress Notes (Addendum)
PROGRESS NOTE    Ricky Shaw  WUJ:[id number] DOB: Sep 01, 2000 DOA: 08/28/2022 PCP: [name], MD  Chief Complaint  Patient presents with   Hyperglycemia    Brief Narrative:   Ricky Shaw is Ricky Shaw 22 y.o. male with medical history significant for type 1 diabetes with t:slim pump and Dexcom sensor being admitted to the hospital with diabetic ketoacidosis in the setting of pump malfunction.  Assessment & Plan:   Principal Problem:   DKA (diabetic ketoacidosis) Active Problems:   Uncontrolled type 1 diabetes mellitus with hyperglycemia   Noncompliance with diabetes treatment   AKI (acute kidney injury)   Hyperkalemia   Pseudohyponatremia  DKA CO2 9, AG 21, pH 7.09 at presentation Resolved at this time Plan for transition back to pump - addendum, pump is dead, no charger -> will use basal bolus regimen for now Sounds like precipitated by pump malfunction  SIRS Due to above, leukocytosis to 20.2, tachycardia at presentation Suspect suspect leukocytosis due to stress response, tachycardia related to hypovolemia No obvious si/sx infection - UA not concerning for UTI. no CXR done, not currently indicated.  No blood cultures, not currently indicated.  AKI  Resolved, due to above  Hyperkalemia Resolved, due to above  Hyponatremia Mild, due to hyperglycemia      DVT prophylaxis: lovenox Code Status: full Family Communication: none Disposition:   Status is: Inpatient Remains inpatient appropriate because: need for inpatient management of DKA   Consultants:  noen  Procedures:  none  Antimicrobials:  Anti-infectives (From admission, onward)    None       Subjective: No complaints, but limited discussion due to phone call He thinks he has pump - ( I told him we'd plan to transition to pump)  Objective: Vitals:   08/29/22 0400 08/29/22 0500 08/29/22 0600 08/29/22 0745  BP: 112/60 124/73 130/65   Pulse: 71 80 80   Resp: Temp: (!)  97.5 F (36.4 C)   98.2 F (36.8 C)  TempSrc: Oral   Oral  SpO2: 100% 100% 100%   Weight:      Height:        Intake/Output Summary (Last 24 hours) at 08/29/2022 0836 Last data filed at 08/29/2022 9562 Gross per 24 hour  Intake 2596.12 ml  Output 500 ml  Net 2096.12 ml   Filed Weights   08/28/22 1415  Weight: 72.8 kg    Examination:   General exam: Appears calm and comfortable, patient answered phone, on phone while I was at bedside Respiratory system: unlabored Cardiovascular system: sinus on tele Central nervous system: Alert and oriented. No focal neurological deficits. Skin: No visible rashes, lesions or ulcers  Data Reviewed: I have personally reviewed following labs and imaging studies  CBC: Recent Labs  Lab 08/28/22 0759 08/28/22 1102 08/29/22 0432  WBC 15.7* 20.2* 9.7  NEUTROABS 13.3*  --   --   HGB 16.6 15.4 15.2  HCT 49.6 46.8 44.5  MCV 88.9 90.2 86.2  PLT 316 299 276    Basic Metabolic Panel: Recent Labs  Lab 08/28/22 1409 08/28/22 1737 08/28/22 2044 08/28/22 2348 08/29/22 0432  NA 131* 132* 138 138 139  K 4.0 4.1 4.5 4.2 4.1  CL 106 113* 108 111 108  CO2 <7* 12* 17* 18* 19*  GLUCOSE 258* 188* 147* 152* 132*  BUN CREATININE 1.30* 1.19 1.08 1.02 0.96  CALCIUM 8.8* 8.7* 9.1 9.0 9.2    GFR: Estimated  Creatinine Clearance: 110.8 mL/min (by C-G formula based on SCr of 0.96 mg/dL).  Liver Function Tests: No results for input(s): "AST", "ALT", "ALKPHOS", "BILITOT", "PROT", "ALBUMIN" in the last 168 hours.  CBG: Recent Labs  Lab 08/29/22 0304 08/29/22 0417 08/29/22 0457 08/29/22 0635 08/29/22 0744  GLUCAP 139* 143* 123* 151* 154*     No results found for this or any previous visit (from the past 240 hour(s)).       Radiology Studies: No results found.      Scheduled Meds:  Chlorhexidine Gluconate Cloth  6 each Topical Daily   enoxaparin (LOVENOX) injection  40 mg Subcutaneous Q24H   insulin pump    Subcutaneous TID WC, HS, 0200   Continuous Infusions:  dextrose 5% lactated ringers 125 mL/hr at 08/29/22 0745   insulin 2.4 Units/hr (08/29/22 1610)   lactated ringers Stopped (08/28/22 1501)     LOS: 1 day    Time spent: over 30 min    Lacretia Nicks, MD [facility]   To contact the attending provider between 7A-7P or the covering provider during after hours 7P-7A, please log into the web site [email or web address] and access using universal [place] password for that web site. If you do not have the password, please call the hospital operator.  08/29/2022, 8:36 AM

## 2022-08-29 NOTE — Discharge Instructions (Addendum)
Insulin regimen: Tsim insulin pump  - Basal Rates 12AM 1.90  3am 2.0  6am 2.0   8am 2.30  9pm   2.20     50.6 units per day  Insulin to Carbohydrate Ratio 12AM 10  3 am 10   6 am 6  11 am 5   9 pm 5    Insulin Sensitivity Factor 12AM 24  3am 24       6am 25   8am 9pm   25  25         Target Blood Glucose 12AM 110   6am 110  9pm 110              In case of pump failure: Take a Novolog injection first to correct your glucose. 1 units of insulin drops you 25 points. Your target is 110  Whatever your glucose is at the time - 110 (your target) =  take this number and divide it by 25 (how much insulin drops you) then this is how much insulin you should take  Lantus 35 units Novolog 10 units tid before meals

## 2022-08-29 NOTE — Discharge Summary (Signed)
Physician Discharge Summary  Ricky Shaw:[id number] DOB: April 19, 2001 DOA: 08/28/2022  PCP: [name], MD  Admit date: 08/28/2022 Discharge date: 08/29/2022  Time spent: 40 minutes  Recommendations for Outpatient Follow-up:  Follow outpatient CBC/CMP  Follow with endocrine outpatient    Discharge Diagnoses:  Principal Problem:   DKA (diabetic ketoacidosis) Active Problems:   Uncontrolled type 1 diabetes mellitus with hyperglycemia   Noncompliance with diabetes treatment   AKI (acute kidney injury)   Hyperkalemia   Pseudohyponatremia   Discharge Condition: stable  Diet recommendation: diabetic diet   Filed Weights   08/28/22 1415  Weight: 72.8 kg    History of present illness:   Ricky Shaw is Ricky Shaw 22 y.o. male with medical history significant for type 1 diabetes with t:slim pump and Dexcom sensor being admitted to the hospital with diabetic ketoacidosis in the setting of pump malfunction.   Currently out of DKA, symptoms improved.  Returning home today on pump.    Hospital Course:  Assessment and Plan:  DKA CO2 9, AG 21, pH 7.09 at presentation Resolved at this time Transitioned back to pump at discharge Refilled short acting insulin in case of pump failure.  He has lantus at home in case of pump failure.  Refilled transmitter. Sounds like precipitated by pump malfunction   SIRS Due to above, leukocytosis to 20.2, tachycardia at presentation ---> resolved at the time of discharge Suspect suspect leukocytosis due to stress response, tachycardia related to hypovolemia No obvious si/sx infection - UA not concerning for UTI. no CXR done, not currently indicated.  No blood cultures, not currently indicated.   AKI  Resolved, due to above   Hyperkalemia Resolved, due to above   Hyponatremia Mild, due to hyperglycemia      Procedures: none   Consultations: none  Discharge Exam: Vitals:   08/29/22 1200 08/29/22 1600  BP:    Pulse:     Resp: 14   Temp: 98.1 F (36.7 C) 97.6 F (36.4 C)  SpO2:     No new complaints Eating and drinking well Feels comfortable with d/c Significant other and mom at bedside  Discharge Instructions   Discharge Instructions     Call MD for:  difficulty breathing, headache or visual disturbances   Complete by: As directed    Call MD for:  extreme fatigue   Complete by: As directed    Call MD for:  hives   Complete by: As directed    Call MD for:  persistant dizziness or light-headedness   Complete by: As directed    Call MD for:  persistant nausea and vomiting   Complete by: As directed    Call MD for:  redness, tenderness, or signs of infection (pain, swelling, redness, odor or green/yellow discharge around incision site)   Complete by: As directed    Call MD for:  severe uncontrolled pain   Complete by: As directed    Call MD for:  temperature >100.4   Complete by: As directed    Diet - low sodium heart healthy   Complete by: As directed    Discharge instructions   Complete by: As directed    You were seen for DKA.  It sounds like this developed in the setting of your insulin pump failing.  Continue to use your pump as prescribed at home.    If your pump fails, you can transition to using lantus 35 units daily and 10 units of lispro with meals.  You'll also  use correctional insulin to get you to your target blood sugar (see the instructions from the diabetic coordinator).  Please let us know before you discharge if you have questions regarding this regimen.   Please follow up with your endocrinologist outpatient.  Return for new, recurrent, or worsening symptoms.  Please ask your PCP to request records from this hospitalization so they know what was done and what the next steps will be.   Increase activity slowly   Complete by: As directed       Allergies as of 08/29/2022       Reactions   Amoxicillin Hives        Medication List     STOP taking these  medications    insulin aspart 100 UNIT/ML FlexPen Commonly known as: NOVOLOG       TAKE these medications    Dexcom G6 Sensor Misc 1 Device by Does not apply route continuous.   Dexcom G6 Transmitter Misc Use as directed What changed: additional instructions   glucagon 1 MG injection Use for Severe Hypoglycemia, if unresponsive, unable to swallow, unconscious and/or has seizure What changed:  how much to take how to take this when to take this reasons to take this additional instructions   insulin glargine 100 UNIT/ML Solostar Pen Commonly known as: LANTUS Inject 35 Units into the skin daily. ONLY use in the setting of pump failure.   insulin lispro 100 UNIT/ML injection Commonly known as: HUMALOG USE 300 UNITS IN INSULIN PUMP EVERY 48 HOURS What changed:  how much to take how to take this when to take this additional instructions   insulin lispro 100 UNIT/ML KwikPen Commonly known as: HUMALOG Inject 10 Units into the skin 3 (three) times daily. ONLY use in case of pump failure.  Refer to instructions for how to take and what amount. What changed: You were already taking Ricky Shaw medication with the same name, and this prescription was added. Make sure you understand how and when to take each.   Insulin Pen Needle 32G X 4 MM Misc 1 applicator by Does not apply route 3 (three) times daily. Inject up to 5 x per day.       Allergies  Allergen Reactions   Amoxicillin Hives      The results of significant diagnostics from this hospitalization (including imaging, microbiology, ancillary and laboratory) are listed below for reference.    Significant Diagnostic Studies: No results found.  Microbiology: No results found for this or any previous visit (from the past 240 hour(s)).   Labs: Basic Metabolic Panel: Recent Labs  Lab 08/28/22 1409 08/28/22 1737 08/28/22 2044 08/28/22 2348 08/29/22 0432  NA 131* 132* 138 138 139  K 4.0 4.1 4.5 4.2 4.1  CL 106 113*  108 111 108  CO2 <7* 12* 17* 18* 19*  GLUCOSE 258* 188* 147* 152* 132*  BUN CREATININE 1.30* 1.19 1.08 1.02 0.96  CALCIUM 8.8* 8.7* 9.1 9.0 9.2   Liver Function Tests: No results for input(s): "AST", "ALT", "ALKPHOS", "BILITOT", "PROT", "ALBUMIN" in the last 168 hours. No results for input(s): "LIPASE", "AMYLASE" in the last 168 hours. No results for input(s): "AMMONIA" in the last 168 hours. CBC: Recent Labs  Lab 08/28/22 0759 08/28/22 1102 08/29/22 0432  WBC 15.7* 20.2* 9.7  NEUTROABS 13.3*  --   --   HGB 16.6 15.4 15.2  HCT 49.6 46.8 44.5  MCV 88.9 90.2 86.2  PLT 316 299 276   Cardiac  Enzymes: No results for input(s): "CKTOTAL", "CKMB", "CKMBINDEX", "TROPONINI" in the last 168 hours. BNP: BNP (last 3 results) No results for input(s): "BNP" in the last 8760 hours.  ProBNP (last 3 results) No results for input(s): "PROBNP" in the last 8760 hours.  CBG: Recent Labs  Lab 08/29/22 0744 08/29/22 0840 08/29/22 1038 08/29/22 1232 08/29/22 1624  GLUCAP 154* 141* 124* 187* 163*       Signed:  Lacretia Nicks MD.  [facility] 08/29/2022, 5:38 PM

## 2022-09-20 ENCOUNTER — Encounter (INDEPENDENT_AMBULATORY_CARE_PROVIDER_SITE_OTHER): Payer: Self-pay | Admitting: Family

## 2022-09-20 ENCOUNTER — Ambulatory Visit (INDEPENDENT_AMBULATORY_CARE_PROVIDER_SITE_OTHER): Payer: Medicaid Other | Admitting: Family

## 2022-09-20 VITALS — BP 130/90 | HR 86 | Wt 180.0 lb

## 2022-09-20 DIAGNOSIS — R824 Acetonuria: Secondary | ICD-10-CM | POA: Diagnosis not present

## 2022-09-20 DIAGNOSIS — E1065 Type 1 diabetes mellitus with hyperglycemia: Secondary | ICD-10-CM | POA: Diagnosis not present

## 2022-09-20 DIAGNOSIS — Z4681 Encounter for fitting and adjustment of insulin pump: Secondary | ICD-10-CM

## 2022-09-20 DIAGNOSIS — Z91199 Patient's noncompliance with other medical treatment and regimen due to unspecified reason: Secondary | ICD-10-CM

## 2022-09-20 LAB — POCT URINALYSIS DIPSTICK (MANUAL)

## 2022-09-20 LAB — POCT GLYCOSYLATED HEMOGLOBIN (HGB A1C): Hemoglobin A1C: 9.8 % — AB (ref 4.0–5.6)

## 2022-09-20 LAB — POCT GLUCOSE (DEVICE FOR HOME USE): Glucose Fasting, POC: 411 mg/dL — AB (ref 70–99)

## 2022-09-20 NOTE — Progress Notes (Signed)
Pediatric Endocrinology Diabetes Consultation Follow-up Visit  Ricky Shaw Jul 18, 2000 [id number]  Chief Complaint: Follow-up type 1 diabetes   Ricky Edward, MD   HPI: Ricky Shaw  is a 22 y.o. male presenting for follow-up of type 1 diabetes. he is accompanied to this visit by his grandmother  .  1. Ricky Shaw was admitted to the pediatric ward at [facility] on 11/13/11 for the above chief complaint. He had had polyuria and polydipsia. His initial BG was 340. Initial serum glucose was 354. Serum CO2 was 18. Venous pH was 7.367.  Hemoglobin A1c was 13.3%. Serum C-peptide was 0.68 (normal 0.80-390). Urine glucose was >1000 and urine ketones were >80. Anti-islet cell antibody was 40 (normal <5). Insulin antibodies were 5.0 (normal <0.4).Anti-GAD antibody was negative at <1.0. TSH was 2.344, free T4 1.36, free T3 3.5.  Tissue transglutaminase IgA was 2.2 (normal <20). Gliadin IgA antibodies were 4.9 (normal <20). He was started on Lantus as a basal insulin and Novolog as a bolus insulin at mealtimes, and also at bedtime and 2 AM if needed. After receiving IV fluids, insulins, and DM education, he was discharged on 11/17/11.   2. Since last visit to [facility] on 04/2022, he was admitted to [facility] on 08/2022 due to DKA which he reported was caused by insulin pump failure. He had no show for visit with me on 05/17/2022 and a cancellation on 08/16/2022.   He is working at [employer] out on [place] about 5 days per week.   He reports wearing his insulin pump consistently. He is not as consistent wearing Dexcom CGM. He thinks his hospitalization was due to a pump site failure, he has not had any since that time. He usually boluses after eating, denies skipping boluses at meals. Hypoglycemia does not occur often. He is able to feel symptoms of low blood sugars.    Insulin regimen: Tsim insulin pump  - Basal Rates 12AM 1.90   3am 2.0   6am 2.0   8am 2.30   9pm  2.20    52.2  units per day   Insulin to  Carbohydrate Ratio 12AM 10  3am 10  6am 6  8am 5   9pm 5    Insulin Sensitivity Factor 12AM 24  3am 24     6am 25   8am 9pm  25  25      Target Blood Glucose 12AM 110   6am 110  9pm 110           Hypoglycemia: Is consistently able to feel low blood sugars.practice. No glucagon needed.  Insulin pump download:     Med-alert ID: Bracelet  Injection sites: legs, abdomen  Annual labs due: 2024--> lab not in clinic today, will get at next visit.  Ophthalmology due: 2023. Discussed he is overdue. Needs to have exam. Stressed importance.     3. ROS: Greater than 10 systems reviewed with pertinent positives listed in HPI, otherwise neg. Constitutional: Sleeping well. + 10 lbs weight gain  Eyes: No changes in vision. No blurry vision.  Ears/Nose/Mouth/Throat: No difficulty swallowing. Denies neck pain  Cardiovascular: No palpitations. Denies chest pain  Respiratory: No increased work of breathing. Denies SOB   Neurologic: Normal sensation, no tremor GI: No abdominal pain. No constipation/diarrhea.  Endocrine: No polydipsia.  No hyperpigmentation Psychiatric: Normal affect today. Denies depression and anxiety   Past Medical History:   Past Medical History:  Diagnosis Date   Diabetes mellitus 11/14/2011   Diabetes mellitus without complication (HCC)  Phreesia 03/03/2020    Medications:  Outpatient Encounter Medications as of 09/20/2022  Medication Sig Note   Continuous Blood Gluc Sensor (DEXCOM G6 SENSOR) MISC 1 Device by Does not apply route continuous.    Continuous Glucose Transmitter (DEXCOM G6 TRANSMITTER) MISC Use as directed    insulin lispro (HUMALOG) 100 UNIT/ML injection USE 300 UNITS IN INSULIN PUMP EVERY 48 HOURS (Patient taking differently: Inject 250 Units into the skin See admin instructions. Load pump with 250 units every 3 days.) 08/28/2022: Had pump on this morning.    glucagon 1 MG injection Use for Severe Hypoglycemia, if unresponsive, unable to  swallow, unconscious and/or has seizure (Patient not taking: Reported on 09/20/2022)    insulin glargine (LANTUS) 100 UNIT/ML Solostar Pen Inject 35 Units into the skin daily. ONLY use in the setting of pump failure. (Patient not taking: Reported on 09/20/2022)    insulin lispro (HUMALOG) 100 UNIT/ML KwikPen Inject 10 Units into the skin 3 (three) times daily. ONLY use in case of pump failure.  Refer to instructions for how to take and what amount. (Patient not taking: Reported on 09/20/2022)    Insulin Pen Needle 32G X 4 MM MISC 1 applicator by Does not apply route 3 (three) times daily. Inject up to 5 x per day. (Patient not taking: Reported on 09/20/2022)    No facility-administered encounter medications on file as of 09/20/2022.    Allergies: Allergies  Allergen Reactions   Amoxicillin Hives    Surgical History: Past Surgical History:  Procedure Laterality Date   nasal cauterization     NASAL HEMORRHAGE CONTROL  05/14/2006    Family History:  Family History  Problem Relation Age of Onset   Asthma Maternal Aunt    Cancer Maternal Grandfather    Diabetes Paternal Grandmother    Hypertension Other    Thyroid disease Neg Hx       Social History: Lives with: mother  Graduated. Unemployed currently   Physical Exam:  Vitals:   09/20/22 1155  BP: (!) 130/90  Pulse: 86  Weight: 180 lb (81.6 kg)     BP (!) 130/90   Pulse 86   Wt 180 lb (81.6 kg)   BMI 29.05 kg/m  Body mass index: body mass index is 29.05 kg/m. Growth %ile SmartLinks can only be used for patients less than 30 years old.  Ht Readings from Last 3 Encounters:  08/28/22 5\' 6"  (1.676 m)  12/08/21 5\' 7"  (1.702 m) (18 %, Z= -0.92)*  03/21/21 5\' 5"  (1.651 m) (6 %, Z= -1.59)*   * Growth percentiles are based on CDC (Boys, 2-20 Years) data.   Wt Readings from Last 3 Encounters:  09/20/22 180 lb (81.6 kg)  08/28/22 160 lb 7.9 oz (72.8 kg)  04/17/22 170 lb (77.1 kg) (70 %, Z= 0.53)*   * Growth percentiles are  based on CDC (Boys, 2-20 Years) data.    PHYSICAL EXAM: General: Well developed, well nourished male in no acute distress.  Head: Normocephalic, atraumatic.   Eyes:  Pupils equal and round. EOMI.  Sclera white.  No eye drainage.   Ears/Nose/Mouth/Throat: Nares patent, no nasal drainage.  Normal dentition, mucous membranes moist.  Neck: supple, no cervical lymphadenopathy, no thyromegaly Cardiovascular: regular rate, normal S1/S2, no murmurs Respiratory: No increased work of breathing.  Lungs clear to auscultation bilaterally.  No wheezes. Abdomen: soft, nontender, nondistended. Normal bowel sounds.  No appreciable masses  Extremities: warm, well perfused, cap refill < 2 sec.   Musculoskeletal:  Normal muscle mass.  Normal strength Skin: warm, dry.  No rash or lesions. Neurologic: alert and oriented, normal speech, no tremor    Labs:  Results for orders placed or performed in visit on 09/20/22  POCT glycosylated hemoglobin (Hb A1C)  Result Value Ref Range   Hemoglobin A1C 9.8 (A) 4.0 - 5.6 %   HbA1c POC (<> result, manual entry)     HbA1c, POC (prediabetic range)     HbA1c, POC (controlled diabetic range)    POCT Glucose (Device for Home Use)  Result Value Ref Range   Glucose Fasting, POC 411 (A) 70 - 99 mg/dL   POC Glucose    POCT Urinalysis Dip Manual  Result Value Ref Range   Spec Grav, UA     pH, UA     Leukocytes, UA     Nitrite, UA     Poct Protein     Poct Glucose     Poct Ketones ++ moderate (A) Negative   Poct Urobilinogen     Poct Bilirubin     Poct Blood      Assessment/Plan: Ricky Shaw is a 22 y.o. male with uncontrolled type 1 diabetes using Tandem Tslim insulin pump. Recently hospitalized with DKA due to pump site failure and poor compliance. His hemoglobin A1c is 9.8% which is higher then ADA goal of <7%. He is hyperglycemic with ketonuria in clinic today.    1-2. DM w/o complication type I, uncontrolled (HCC)/Hyperglycemia - Reviewed insulin pump and  CGM download. Discussed trends and patterns.  - Rotate pump sites to prevent scar tissue.  - bolus 15 minutes prior to eating to limit blood sugar spikes.  - Reviewed carb counting and importance of accurate carb counting.  - Discussed signs and symptoms of hypoglycemia. Always have glucose available.  - POCT glucose and hemoglobin A1c  - Reviewed growth chart.  - Discussed failed pump sites and what to do when pump site failures occur.   3. Adjustment reaction/noncompliance  -Discussed barriers to care  - Discussed possible complications of uncontrolled T1DM.  - Offered appointment for behavioral health counseling.   4. Insulin pump Titration /Insulin pump in place.   Basal Rates 12AM 1.90 --> 2. 0   3am 2.0 --> 2.10   6am 2.0 --> 2.10   8am 2.30 --> 2.40   9pm  2.20 --> 2.30    54.6 units per day   Insulin to Carbohydrate Ratio 12AM 10--> 8   3am 10--> 8   6am 6  8am 5   9pm 5    Insulin Sensitivity Factor 12AM 24  3am 24     6am 25   8am 9pm  25 --> 20  25      4. Ketonuria  - pump site changed in clinic.   Ketone Protocol  - Until ketones are clear:  - Check blood sugar every 2-3 hours and give insulin  - Check ketones every 3 hours  - Drink at least 20 hours of water per hour  If you develop nausea, vomiting, change in breathing or LOC--> go to ER.   Follow-up:  4 weeks.   LOS: >40  spent today reviewing the medical chart, counseling the patient/family, and documenting today's visit.  When a patient is on insulin, intensive monitoring of blood glucose levels is necessary to avoid hyperglycemia and hypoglycemia. Severe hyperglycemia/hypoglycemia can lead to hospital admissions and be life threatening.     Gretchen Short,  FNP-C  Pediatric Specialist  [address]  [address] [place], [address]  Tele: [phone]

## 2022-09-20 NOTE — Patient Instructions (Signed)
Basal Rates 12AM 1.90 --> 2. 0   3am 2.0 --> 2.10   6am 2.0 --> 2.10   8am 2.30 --> 2.40   9pm  2.20 --> 2.30    54.6 units per day   Insulin to Carbohydrate Ratio 12AM 10--> 8   3am 10--> 8   6am 6  8am 5   9pm 5    Insulin Sensitivity Factor 12AM 24  3am 24     6am 25   8am 9pm  25 --> 20  25     Ketone Protocol  - Until ketones are clear:  - Check blood sugar every 2-3 hours and give insulin  - Check ketones every 3 hours  - Drink at least 20 hours of water per hour   If you develop nausea, vomiting, change in breathing or LOC--> go to ER.

## 2022-10-13 ENCOUNTER — Other Ambulatory Visit (INDEPENDENT_AMBULATORY_CARE_PROVIDER_SITE_OTHER): Payer: Self-pay | Admitting: Family

## 2022-11-06 ENCOUNTER — Ambulatory Visit (INDEPENDENT_AMBULATORY_CARE_PROVIDER_SITE_OTHER): Payer: 59 | Admitting: Family

## 2022-11-06 ENCOUNTER — Encounter (INDEPENDENT_AMBULATORY_CARE_PROVIDER_SITE_OTHER): Payer: Self-pay | Admitting: Family

## 2022-11-06 VITALS — BP 122/80 | HR 94 | Wt 172.2 lb

## 2022-11-06 DIAGNOSIS — E1065 Type 1 diabetes mellitus with hyperglycemia: Secondary | ICD-10-CM | POA: Diagnosis not present

## 2022-11-06 DIAGNOSIS — Z4681 Encounter for fitting and adjustment of insulin pump: Secondary | ICD-10-CM | POA: Diagnosis not present

## 2022-11-06 DIAGNOSIS — F4320 Adjustment disorder, unspecified: Secondary | ICD-10-CM

## 2022-11-06 LAB — POCT GLUCOSE (DEVICE FOR HOME USE): POC Glucose: 149 mg/dl — AB (ref 70–99)

## 2022-11-06 NOTE — Progress Notes (Signed)
Pediatric Endocrinology Diabetes Consultation Follow-up Visit  JOSEALFREDO ADKINS 2000-10-09 [id number]  Chief Complaint: Follow-up type 1 diabetes   [name], MD   HPI: Forest  is a 22 y.o. male presenting for follow-up of type 1 diabetes. he is accompanied to this visit by his grandmother  .  1. Sherrick was admitted to the pediatric ward at [facility] on 11/13/11 for the above chief complaint. He had had polyuria and polydipsia. His initial BG was 340. Initial serum glucose was 354. Serum CO2 was 18. Venous pH was 7.367.  Hemoglobin A1c was 13.3%. Serum C-peptide was 0.68 (normal 0.80-390). Urine glucose was >1000 and urine ketones were >80. Anti-islet cell antibody was 40 (normal <5). Insulin antibodies were 5.0 (normal <0.4).Anti-GAD antibody was negative at <1.0. TSH was 2.344, free T4 1.36, free T3 3.5.  Tissue transglutaminase IgA was 2.2 (normal <20). Gliadin IgA antibodies were 4.9 (normal <20). He was started on Lantus as a basal insulin and Novolog as a bolus insulin at mealtimes, and also at bedtime and 2 AM if needed. After receiving IV fluids, insulins, and DM education, he was discharged on 11/17/11.   2. Since last visit to [facility] on 09/2022, he has been well.   He has been staying busy at work. Reports that a friend recently passed away from a drug overdose. He is having a hard time dealing with the loss, using his friends and family to talk to.   Using Tandem insulin pump and Dexcom CGM, he reports wearing both consistently. Occasionally has pump site failures but he monitors closely. He boluses before eating about 50% of the time. Estimates his carbs, he feels 60-70 grams per meal is normal. Hypoglycemia does not occur often, none severe. He is able to feel symptoms when under 80.    Insulin regimen: Tsim insulin pump  Basal Rates 12AM 2. 0   3am 2.10   6am 2.10   8am 2.40   9pm  2.30    54.6 units per day   Insulin to Carbohydrate Ratio 12AM 8   3am 8   6am 6   8am 5   9pm 5    Insulin Sensitivity Factor 12AM 24  3am 24     6am 25   8am 9pm  20  25     Target Blood Glucose 12AM 110   6am 110  9pm 110           Hypoglycemia: Is consistently able to feel low blood sugars.practice. No glucagon needed.  Insulin pump download:     Med-alert ID: Bracelet  Injection sites: legs, abdomen  Annual labs due: 2024-->ordered  Ophthalmology due: 2023. Advised he needs to go annually.     3. ROS: Greater than 10 systems reviewed with pertinent positives listed in HPI, otherwise neg. Constitutional: Sleeping well.  Eyes: No changes in vision. No blurry vision.  Ears/Nose/Mouth/Throat: No difficulty swallowing. Denies neck pain  Cardiovascular: No palpitations. Denies chest pain  Respiratory: No increased work of breathing. Denies SOB   Neurologic: Normal sensation, no tremor GI: No abdominal pain. No constipation/diarrhea.  Endocrine: No polydipsia.  No hyperpigmentation Psychiatric: Normal affect today. Denies depression and anxiety   Past Medical History:   Past Medical History:  Diagnosis Date   Diabetes mellitus 11/14/2011   Diabetes mellitus without complication (HCC)    Phreesia 03/03/2020    Medications:  Outpatient Encounter Medications as of 11/06/2022  Medication Sig   Continuous Blood Gluc Sensor (DEXCOM G6 SENSOR) MISC 1  Device by Does not apply route continuous.   Continuous Glucose Transmitter (DEXCOM G6 TRANSMITTER) MISC Use as directed   insulin lispro (HUMALOG) 100 UNIT/ML injection USE 300 UNITS VIA INSULIN PUMP EVERY 48 HOURS   glucagon 1 MG injection Use for Severe Hypoglycemia, if unresponsive, unable to swallow, unconscious and/or has seizure (Patient not taking: Reported on 09/20/2022)   insulin glargine (LANTUS) 100 UNIT/ML Solostar Pen Inject 35 Units into the skin daily. ONLY use in the setting of pump failure. (Patient not taking: Reported on 09/20/2022)   insulin lispro (HUMALOG) 100 UNIT/ML KwikPen  Inject 10 Units into the skin 3 (three) times daily. ONLY use in case of pump failure.  Refer to instructions for how to take and what amount. (Patient not taking: Reported on 09/20/2022)   Insulin Pen Needle 32G X 4 MM MISC 1 applicator by Does not apply route 3 (three) times daily. Inject up to 5 x per day. (Patient not taking: Reported on 09/20/2022)   No facility-administered encounter medications on file as of 11/06/2022.    Allergies: Allergies  Allergen Reactions   Amoxicillin Hives    Surgical History: Past Surgical History:  Procedure Laterality Date   nasal cauterization     NASAL HEMORRHAGE CONTROL  05/14/2006    Family History:  Family History  Problem Relation Age of Onset   Asthma Maternal Aunt    Cancer Maternal Grandfather    Diabetes Paternal Grandmother    Hypertension Other    Thyroid disease Neg Hx       Social History: Lives with: mother  Graduated. Unemployed currently   Physical Exam:  Vitals:   11/06/22 1038  BP: 122/80  Pulse: 94  Weight: 172 lb 3.2 oz (78.1 kg)      BP 122/80   Pulse 94   Wt 172 lb 3.2 oz (78.1 kg)   BMI 27.79 kg/m  Body mass index: body mass index is 27.79 kg/m. Growth %ile SmartLinks can only be used for patients less than 89 years old.  Ht Readings from Last 3 Encounters:  08/28/22 5\' 6"  (1.676 m)  12/08/21 5\' 7"  (1.702 m) (18 %, Z= -0.92)*  03/21/21 5\' 5"  (1.651 m) (6 %, Z= -1.59)*   * Growth percentiles are based on CDC (Boys, 2-20 Years) data.   Wt Readings from Last 3 Encounters:  11/06/22 172 lb 3.2 oz (78.1 kg)  09/20/22 180 lb (81.6 kg)  08/28/22 160 lb 7.9 oz (72.8 kg)    PHYSICAL EXAM: General: Well developed, well nourished male in no acute distress.   Head: Normocephalic, atraumatic.   Eyes:  Pupils equal and round. EOMI.  Sclera white.  No eye drainage.   Ears/Nose/Mouth/Throat: Nares patent, no nasal drainage.  Normal dentition, mucous membranes moist.  Neck: supple, no cervical  lymphadenopathy, no thyromegaly Cardiovascular: regular rate, normal S1/S2, no murmurs Respiratory: No increased work of breathing.  Lungs clear to auscultation bilaterally.  No wheezes. Abdomen: soft, nontender, nondistended. Normal bowel sounds.  No appreciable masses  Extremities: warm, well perfused, cap refill < 2 sec.   Musculoskeletal: Normal muscle mass.  Normal strength Skin: warm, dry.  No rash or lesions. Neurologic: alert and oriented, normal speech, no tremor    Labs:  Results for orders placed or performed in visit on 11/06/22  POCT Glucose (Device for Home Use)  Result Value Ref Range   Glucose Fasting, POC     POC Glucose 149 (A) 70 - 99 mg/dl    Assessment/Plan: Ladislao is  a 22 y.o. male with uncontrolled type 1 diabetes using Tandem Tslim insulin pump. His pump shows that he is entering 30-45 grams of carbs per meal instead of the 60-70 grams he actually consumes; this is leading to post prandial hyperglycemia. His time in target range is 40% which is below goal of >70%.     1-2. DM w/o complication type I, uncontrolled (HCC)/Hyperglycemia - Reviewed insulin pump and CGM download. Discussed trends and patterns.  - Rotate pump sites to prevent scar tissue.  - bolus 15 minutes prior to eating to limit blood sugar spikes.  - Reviewed carb counting and importance of accurate carb counting.  - Discussed signs and symptoms of hypoglycemia. Always have glucose available.  - POCT glucose and hemoglobin A1c  - Reviewed growth chart.  - Advised to enter the full amount of carbs consumed when bolusing. If he is having lows, contact me for pump adjustments.  Lab Orders         COMPLETE METABOLIC PANEL WITH GFR         Lipid panel         Microalbumin / creatinine urine ratio         T4, free         TSH         POCT Glucose (Device for Home Use)      3. Adjustment reaction/noncompliance  -Discussed barriers to care including loss of friend recently  - Encouraged  counseling/behavioral health.   4. Insulin pump Titration /Insulin pump in place.   Basal Rates 12AM 2. 0 --> 2.10  3am 2.10 --> 2.15  6am 2.10 --> 2.20   8am 2.40 --> 2.50   9pm  2.30 --> 2.40    56.85  units per day   Insulin to Carbohydrate Ratio 12AM 8   3am 8   6am 6  8am 5   9pm 5    Insulin Sensitivity Factor 12AM 24  3am 24     6am 25   8am 9pm  20  25     Target Blood Glucose 12AM 110   6am 110  9pm 110            Follow-up:  4 weeks.   LOS: >40  spent today reviewing the medical chart, counseling the patient/family, and documenting today's visit. When a patient is on insulin, intensive monitoring of blood glucose levels is necessary to avoid hyperglycemia and hypoglycemia. Severe hyperglycemia/hypoglycemia can lead to hospital admissions and be life threatening.     Gretchen Short,  FNP-C  Pediatric Specialist  [address]  [place], [address]  Tele: [phone]

## 2022-11-06 NOTE — Patient Instructions (Signed)
Basal Rates 12AM 2. 0 --> 2.10  3am 2.10 --> 2.5  6am 2.10 --> 2.20   8am 2.40 --> 2.50   9pm  2.30 --> 2.40    56.85  units per day   Insulin to Carbohydrate Ratio 12AM 8   3am 8   6am 6  8am 5   9pm 5    Insulin Sensitivity Factor 12AM 24  3am 24     6am 25   8am 9pm  20  25     Target Blood Glucose 12AM 110   6am 110  9pm 110           It was a pleasure seeing you in clinic today. Please do not hesitate to contact me if you have questions or concerns.   Please sign up for MyChart. This is a communication tool that allows you to send an email directly to me. This can be used for questions, prescriptions and blood sugar reports. We will also release labs to you with instructions on MyChart. Please do not use MyChart if you need immediate or emergency assistance. Ask our wonderful front office staff if you need assistance.

## 2022-11-07 LAB — COMPLETE METABOLIC PANEL WITH GFR
AG Ratio: 1.7 (calc) (ref 1.0–2.5)
ALT: 14 U/L (ref 9–46)
AST: 12 U/L (ref 10–40)
Albumin: 4.7 g/dL (ref 3.6–5.1)
Alkaline phosphatase (APISO): 55 U/L (ref 36–130)
BUN: 13 mg/dL (ref 7–25)
CO2: 29 mmol/L (ref 20–32)
Calcium: 10.3 mg/dL (ref 8.6–10.3)
Chloride: 101 mmol/L (ref 98–110)
Creat: 0.87 mg/dL (ref 0.60–1.24)
Globulin: 2.8 g/dL (calc) (ref 1.9–3.7)
Glucose, Bld: 137 mg/dL — ABNORMAL HIGH (ref 65–99)
Potassium: 3.9 mmol/L (ref 3.5–5.3)
Sodium: 140 mmol/L (ref 135–146)
Total Bilirubin: 0.4 mg/dL (ref 0.2–1.2)
Total Protein: 7.5 g/dL (ref 6.1–8.1)
eGFR: 127 mL/min/{1.73_m2} (ref >=60)

## 2022-11-07 LAB — T4, FREE: Free T4: 1.2 ng/dL (ref 0.8–1.4)

## 2022-11-07 LAB — LIPID PANEL
Cholesterol: 177 mg/dL (ref <200)
HDL: 29 mg/dL — ABNORMAL LOW (ref >=40)
LDL Cholesterol (Calc): 126 mg/dL (calc) — ABNORMAL HIGH
Non-HDL Cholesterol (Calc): 148 mg/dL (calc) — ABNORMAL HIGH (ref <130)
Total CHOL/HDL Ratio: 6.1 (calc) — ABNORMAL HIGH (ref <5.0)
Triglycerides: 110 mg/dL (ref <150)

## 2022-11-07 LAB — MICROALBUMIN / CREATININE URINE RATIO
Creatinine, Urine: 261 mg/dL (ref 20–320)
Microalb Creat Ratio: 141 mg/g creat — ABNORMAL HIGH (ref <30)
Microalb, Ur: 36.7 mg/dL

## 2022-11-07 LAB — TSH: TSH: 1.08 mIU/L (ref 0.40–4.50)

## 2022-11-12 ENCOUNTER — Other Ambulatory Visit (INDEPENDENT_AMBULATORY_CARE_PROVIDER_SITE_OTHER): Payer: Self-pay | Admitting: Family

## 2022-11-12 MED ORDER — LISINOPRIL 5 MG PO TABS: 5.0000 mg | ORAL_TABLET | Freq: Every day | ORAL | 3 refills | Status: DC

## 2022-11-29 ENCOUNTER — Encounter (INDEPENDENT_AMBULATORY_CARE_PROVIDER_SITE_OTHER): Payer: Self-pay

## 2022-12-17 ENCOUNTER — Other Ambulatory Visit (INDEPENDENT_AMBULATORY_CARE_PROVIDER_SITE_OTHER): Payer: Self-pay | Admitting: Family

## 2022-12-17 DIAGNOSIS — E1065 Type 1 diabetes mellitus with hyperglycemia: Secondary | ICD-10-CM

## 2022-12-20 ENCOUNTER — Encounter (INDEPENDENT_AMBULATORY_CARE_PROVIDER_SITE_OTHER): Payer: Self-pay

## 2022-12-20 ENCOUNTER — Ambulatory Visit (INDEPENDENT_AMBULATORY_CARE_PROVIDER_SITE_OTHER): Payer: 59 | Admitting: Family

## 2022-12-20 DIAGNOSIS — E1065 Type 1 diabetes mellitus with hyperglycemia: Secondary | ICD-10-CM

## 2023-01-24 ENCOUNTER — Encounter: Payer: Self-pay | Admitting: *Deleted

## 2023-01-31 ENCOUNTER — Other Ambulatory Visit (INDEPENDENT_AMBULATORY_CARE_PROVIDER_SITE_OTHER): Payer: Self-pay | Admitting: Family

## 2023-02-01 ENCOUNTER — Observation Stay (HOSPITAL_COMMUNITY)
Admission: EM | Admit: 2023-02-01 | Discharge: 2023-02-02 | Disposition: A | Discharge status: Home / Self Care | Payer: 59 | Attending: Internal Medicine | Admitting: Internal Medicine

## 2023-02-01 ENCOUNTER — Encounter (HOSPITAL_COMMUNITY): Payer: Self-pay

## 2023-02-01 ENCOUNTER — Other Ambulatory Visit: Payer: Self-pay

## 2023-02-01 DIAGNOSIS — Z794 Long term (current) use of insulin: Secondary | ICD-10-CM | POA: Diagnosis not present

## 2023-02-01 DIAGNOSIS — E1010 Type 1 diabetes mellitus with ketoacidosis without coma: Principal | ICD-10-CM | POA: Insufficient documentation

## 2023-02-01 DIAGNOSIS — I10 Essential (primary) hypertension: Secondary | ICD-10-CM | POA: Diagnosis not present

## 2023-02-01 DIAGNOSIS — Z79899 Other long term (current) drug therapy: Secondary | ICD-10-CM | POA: Insufficient documentation

## 2023-02-01 LAB — BLOOD GAS, VENOUS
Acid-base deficit: 20.3 mmol/L — ABNORMAL HIGH (ref 0.0–2.0)
Bicarbonate: 8.7 mmol/L — ABNORMAL LOW (ref 20.0–28.0)
O2 Saturation: 85.7 %
Patient temperature: 37
pCO2, Ven: 30 mmHg — ABNORMAL LOW (ref 44–60)
pH, Ven: 7.07 — CL (ref 7.25–7.43)
pO2, Ven: 58 mmHg — ABNORMAL HIGH (ref 32–45)

## 2023-02-01 LAB — COMPREHENSIVE METABOLIC PANEL
ALT: 16 U/L (ref 0–44)
AST: 16 U/L (ref 15–41)
Albumin: 4.4 g/dL (ref 3.5–5.0)
Alkaline Phosphatase: 65 U/L (ref 38–126)
Anion gap: 21 — ABNORMAL HIGH (ref 5–15)
BUN: 16 mg/dL (ref 6–20)
CO2: 11 mmol/L — ABNORMAL LOW (ref 22–32)
Calcium: 9.4 mg/dL (ref 8.9–10.3)
Chloride: 100 mmol/L (ref 98–111)
Creatinine, Ser: 1.13 mg/dL (ref 0.61–1.24)
GFR, Estimated: >60 mL/min (ref >60)
Glucose, Bld: 493 mg/dL — ABNORMAL HIGH (ref 70–99)
Potassium: 5 mmol/L (ref 3.5–5.1)
Sodium: 132 mmol/L — ABNORMAL LOW (ref 135–145)
Total Bilirubin: 1.4 mg/dL — ABNORMAL HIGH (ref 0.3–1.2)
Total Protein: 8.1 g/dL (ref 6.5–8.1)

## 2023-02-01 LAB — URINALYSIS, ROUTINE W REFLEX MICROSCOPIC
Bacteria, UA: NONE SEEN
Bilirubin Urine: NEGATIVE
Glucose, UA: >=500 mg/dL — AB
Ketones, ur: 80 mg/dL — AB
Leukocytes,Ua: NEGATIVE
Nitrite: NEGATIVE
Protein, ur: 30 mg/dL — AB
Specific Gravity, Urine: 1.026 (ref 1.005–1.030)
pH: 5 (ref 5.0–8.0)

## 2023-02-01 LAB — BASIC METABOLIC PANEL
Anion gap: 15 (ref 5–15)
BUN: 14 mg/dL (ref 6–20)
CO2: 12 mmol/L — ABNORMAL LOW (ref 22–32)
Calcium: 8.6 mg/dL — ABNORMAL LOW (ref 8.9–10.3)
Chloride: 104 mmol/L (ref 98–111)
Creatinine, Ser: 1.04 mg/dL (ref 0.61–1.24)
GFR, Estimated: >60 mL/min (ref >60)
Glucose, Bld: 236 mg/dL — ABNORMAL HIGH (ref 70–99)
Potassium: 4.3 mmol/L (ref 3.5–5.1)
Sodium: 131 mmol/L — ABNORMAL LOW (ref 135–145)

## 2023-02-01 LAB — CBG MONITORING, ED
Glucose-Capillary: 167 mg/dL — ABNORMAL HIGH (ref 70–99)
Glucose-Capillary: 202 mg/dL — ABNORMAL HIGH (ref 70–99)
Glucose-Capillary: 242 mg/dL — ABNORMAL HIGH (ref 70–99)
Glucose-Capillary: 250 mg/dL — ABNORMAL HIGH (ref 70–99)
Glucose-Capillary: 322 mg/dL — ABNORMAL HIGH (ref 70–99)
Glucose-Capillary: 361 mg/dL — ABNORMAL HIGH (ref 70–99)
Glucose-Capillary: 494 mg/dL — ABNORMAL HIGH (ref 70–99)
Glucose-Capillary: 499 mg/dL — ABNORMAL HIGH (ref 70–99)

## 2023-02-01 LAB — I-STAT CHEM 8, ED
BUN: 16 mg/dL (ref 6–20)
Calcium, Ion: 1.19 mmol/L (ref 1.15–1.40)
Chloride: 105 mmol/L (ref 98–111)
Creatinine, Ser: 0.9 mg/dL (ref 0.61–1.24)
Glucose, Bld: 486 mg/dL — ABNORMAL HIGH (ref 70–99)
HCT: 50 % (ref 39.0–52.0)
Hemoglobin: 17 g/dL (ref 13.0–17.0)
Potassium: 5 mmol/L (ref 3.5–5.1)
Sodium: 133 mmol/L — ABNORMAL LOW (ref 135–145)
TCO2: 12 mmol/L — ABNORMAL LOW (ref 22–32)

## 2023-02-01 LAB — CBC
HCT: 47.4 % (ref 39.0–52.0)
Hemoglobin: 15.2 g/dL (ref 13.0–17.0)
MCH: 28.6 pg (ref 26.0–34.0)
MCHC: 32.1 g/dL (ref 30.0–36.0)
MCV: 89.3 fL (ref 80.0–100.0)
Platelets: 298 K/uL (ref 150–400)
RBC: 5.31 MIL/uL (ref 4.22–5.81)
RDW: 13.9 % (ref 11.5–15.5)
WBC: 12.7 K/uL — ABNORMAL HIGH (ref 4.0–10.5)
nRBC: 0 % (ref 0.0–0.2)

## 2023-02-01 LAB — LIPASE, BLOOD: Lipase: 16 U/L (ref 11–51)

## 2023-02-01 LAB — LACTIC ACID, PLASMA: Lactic Acid, Venous: 1.6 mmol/L (ref 0.5–1.9)

## 2023-02-01 LAB — BETA-HYDROXYBUTYRIC ACID: Beta-Hydroxybutyric Acid: 7.78 mmol/L — ABNORMAL HIGH (ref 0.05–0.27)

## 2023-02-01 MED ORDER — LACTATED RINGERS IV SOLN: INTRAVENOUS | Status: DC

## 2023-02-01 MED ORDER — INSULIN REGULAR(HUMAN) IN NACL 100-0.9 UT/100ML-% IV SOLN
INTRAVENOUS | Status: DC
  Administered 2023-02-01: 12 [IU]/h via INTRAVENOUS
  Administered 2023-02-02: 1.9 [IU]/h via INTRAVENOUS
  Filled 2023-02-01 (×2): qty 100

## 2023-02-01 MED ORDER — SODIUM CHLORIDE 0.9 % IV BOLUS: 1000.0000 mL | Freq: Once | INTRAVENOUS | Status: DC

## 2023-02-01 MED ORDER — ONDANSETRON HCL 4 MG/2ML IJ SOLN
4.0000 mg | Freq: Once | INTRAMUSCULAR | Status: AC
  Administered 2023-02-01: 4 mg via INTRAVENOUS
  Filled 2023-02-01: qty 2

## 2023-02-01 MED ORDER — SODIUM CHLORIDE 0.9 % IV BOLUS
1000.0000 mL | Freq: Once | INTRAVENOUS | Status: AC
  Administered 2023-02-01: 1000 mL via INTRAVENOUS

## 2023-02-01 MED ORDER — POTASSIUM CHLORIDE 10 MEQ/100ML IV SOLN
10.0000 meq | INTRAVENOUS | Status: AC
  Administered 2023-02-01 – 2023-02-02 (×2): 10 meq via INTRAVENOUS
  Filled 2023-02-01: qty 100

## 2023-02-01 MED ORDER — DEXTROSE 50 % IV SOLN: 0.0000 mL | INTRAVENOUS | Status: DC | PRN

## 2023-02-01 MED ORDER — POTASSIUM CHLORIDE 10 MEQ/100ML IV SOLN
10.0000 meq | INTRAVENOUS | Status: AC
  Administered 2023-02-01: 10 meq via INTRAVENOUS
  Filled 2023-02-01 (×2): qty 100

## 2023-02-01 MED ORDER — DEXTROSE IN LACTATED RINGERS 5 % IV SOLN: INTRAVENOUS | Status: DC

## 2023-02-01 MED ORDER — LACTATED RINGERS IV BOLUS
20.0000 mL/kg | Freq: Once | INTRAVENOUS | Status: AC
  Administered 2023-02-01: 1600 mL via INTRAVENOUS

## 2023-02-01 NOTE — ED Notes (Signed)
CBG 167

## 2023-02-01 NOTE — ED Provider Notes (Signed)
[place] EMERGENCY DEPARTMENT AT [facility] Provider Note   CSN: [id number] Arrival date & time: 02/01/23  1425     History  Chief Complaint  Patient presents with   Hyperglycemia    Ricky Shaw is a 22 y.o. male with history of T1DM who presents to the ER for hyperglycemia. Reports has not been able to pay for his insulin. Started vomiting. Today. CBG with EMS was 385, given 500 mL bolus during transport.  Patient states that he knew he was running out of his insulin, so he has been rationing it for the past few days.  Complaining of some generalized abdominal pain.  No fever, chills, chest pain, diarrhea.   Hyperglycemia Associated symptoms: abdominal pain, nausea and vomiting        Home Medications Prior to Admission medications   Medication Sig Start Date End Date Taking? Authorizing Provider  lisinopril (ZESTRIL) 5 MG tablet Take 1 tablet (5 mg total) by mouth daily. 11/12/22  Yes Gretchen Short, NP  Continuous Glucose Sensor (DEXCOM G6 SENSOR) MISC USE AS DIRECTED 12/17/22   Gretchen Short, NP  Continuous Glucose Transmitter (DEXCOM G6 TRANSMITTER) MISC Use as directed 08/29/22   Zigmund Daniel., MD  insulin lispro (HUMALOG) 100 UNIT/ML injection USE 300 UNITS VIA INSULIN PUMP EVERY 48 HOURS 02/01/23   Gretchen Short, NP  insulin lispro (HUMALOG) 100 UNIT/ML KwikPen Inject 10 Units into the skin 3 (three) times daily. ONLY use in case of pump failure.  Refer to instructions for how to take and what amount. Patient not taking: Reported on 09/20/2022 08/29/22   Zigmund Daniel., MD  Insulin Pen Needle 32G X 4 MM MISC 1 applicator by Does not apply route 3 (three) times daily. Inject up to 5 x per day. Patient not taking: Reported on 09/20/2022 12/12/21   Gretchen Short, NP      Allergies    Amoxicillin    Review of Systems   Review of Systems  Gastrointestinal:  Positive for abdominal pain, nausea and vomiting.  All other systems reviewed  and are negative.   Physical Exam Updated Vital Signs BP (!) 115/54   Pulse (!) 113   Temp 98.1 F (36.7 C) (Oral)   Resp (!) 22   Ht 5\' 6"  (1.676 m)   Wt 80 kg   SpO2 100%   BMI 28.47 kg/m  Physical Exam Vitals and nursing note reviewed.  Constitutional:      Appearance: Normal appearance.  HENT:     Head: Normocephalic and atraumatic.  Eyes:     Conjunctiva/sclera: Conjunctivae normal.  Cardiovascular:     Rate and Rhythm: Regular rhythm. Tachycardia present.  Pulmonary:     Effort: Pulmonary effort is normal. No respiratory distress.     Breath sounds: Normal breath sounds.  Abdominal:     General: There is no distension.     Palpations: Abdomen is soft.     Tenderness: There is generalized abdominal tenderness.  Skin:    General: Skin is warm and dry.  Neurological:     General: No focal deficit present.     Mental Status: He is alert.     ED Results / Procedures / Treatments   Labs (all labs ordered are listed, but only abnormal results are displayed) Labs Reviewed  CBC - Abnormal; Notable for the following components:      Result Value   WBC 12.7 (*)    All other components within normal limits  URINALYSIS, ROUTINE W REFLEX MICROSCOPIC - Abnormal; Notable for the following components:   Color, Urine STRAW (*)    Glucose, UA >=500 (*)    Hgb urine dipstick SMALL (*)    Ketones, ur 80 (*)    Protein, ur 30 (*)    All other components within normal limits  COMPREHENSIVE METABOLIC PANEL - Abnormal; Notable for the following components:   Sodium 132 (*)    CO2 11 (*)    Glucose, Bld 493 (*)    Total Bilirubin 1.4 (*)    Anion gap 21 (*)    All other components within normal limits  BLOOD GAS, VENOUS - Abnormal; Notable for the following components:   pH, Ven 7.07 (*)    pCO2, Ven 30 (*)    pO2, Ven 58 (*)    Bicarbonate 8.7 (*)    Acid-base deficit 20.3 (*)    All other components within normal limits  CBG MONITORING, ED - Abnormal; Notable for  the following components:   Glucose-Capillary 494 (*)    All other components within normal limits  CBG MONITORING, ED - Abnormal; Notable for the following components:   Glucose-Capillary 499 (*)    All other components within normal limits  I-STAT CHEM 8, ED - Abnormal; Notable for the following components:   Sodium 133 (*)    Glucose, Bld 486 (*)    TCO2 12 (*)    All other components within normal limits  LIPASE, BLOOD  LACTIC ACID, PLASMA  BETA-HYDROXYBUTYRIC ACID  BETA-HYDROXYBUTYRIC ACID  CBG MONITORING, ED    EKG None  Radiology No results found.  Procedures .Critical Care  Performed by: Su Monks, PA-C Authorized by: Su Monks, PA-C   Critical care provider statement:    Critical care time (minutes):  30   Critical care was necessary to treat or prevent imminent or life-threatening deterioration of the following conditions:  Metabolic crisis   Critical care was time spent personally by me on the following activities:  Development of treatment plan with patient or surrogate, discussions with consultants, evaluation of patient's response to treatment, examination of patient, ordering and review of laboratory studies, ordering and review of radiographic studies, ordering and performing treatments and interventions, pulse oximetry, re-evaluation of patient's condition and review of old charts   Care discussed with: admitting provider   Comments:     DKA requiring endotool     Medications Ordered in ED Medications  insulin regular, human (MYXREDLIN) 100 units/ 100 mL infusion (12 Units/hr Intravenous New Bag/Given 02/01/23 1726)  lactated ringers infusion (has no administration in time range)  dextrose 5 % in lactated ringers infusion (has no administration in time range)  dextrose 50 % solution 0-50 mL (has no administration in time range)  potassium chloride 10 mEq in 100 mL IVPB (10 mEq Intravenous New Bag/Given 02/01/23 1724)  sodium chloride 0.9  % bolus 1,000 mL (0 mLs Intravenous Stopped 02/01/23 1722)  ondansetron (ZOFRAN) injection 4 mg (4 mg Intravenous Given 02/01/23 1728)  lactated ringers bolus 1,600 mL (1,600 mLs Intravenous New Bag/Given 02/01/23 1723)    ED Course/ Medical Decision Making/ A&P                                 Medical Decision Making Amount and/or Complexity of Data Reviewed Labs: ordered.  Risk Prescription drug management. Decision regarding hospitalization.   This patient is a 22 y.o. male  who presents to the ED for concern of vomiting.    Differential diagnoses prior to evaluation: The emergent differential diagnosis includes, but is not limited to,  DKA, Sepsis, Drug-related (toxicity, THC hyperemesis, ETOH, withdrawal), Appendicitis, Bowel obstruction, Electrolyte abnormalities, Pancreatitis, Biliary colic, Gastroenteritis, Gastroparesis, Hepatitis, Migraine, Thyroid disease, Renal colic, GERD/PUD, UTI. This is not an exhaustive differential.   Past Medical History / Co-morbidities / Social History: T1DM  Additional history: Chart reviewed. Pertinent results include: Most recently admitted for DKA in April 2024, prior to that July 2023  Physical Exam: Physical exam performed. The pertinent findings include: Tachycardic, otherwise normal vital signs.  Afebrile.  In no acute distress.  Abdomen soft with generalized tenderness.  No guarding or rebound.  Lab Tests/Imaging studies: I personally interpreted labs/imaging and the pertinent results include: Leukocytosis of 12.7, stable hemoglobin.  CMP with sodium 132, CO2 11, glucose 493, anion gap 21.  pH 7.07.  Normal lactic acid at 1.6.  Urinalysis with hemoglobin and ketones.  Cardiac monitoring: EKG obtained and interpreted by myself and attending physician which shows: Sinus tachycardia   Medications: I ordered medication including IV fluids, Zofran, Endo tool, potassium.  I have reviewed the patients home medicines and have made adjustments  as needed.  Consultations obtained: I consulted with hospitalist Dr. Jerral Ralph who will admit   Disposition: After consideration of the diagnostic results and the patients response to treatment, I feel that patient is requiring medical admission for DKA, type 1 diabetes.  Final Clinical Impression(s) / ED Diagnoses Final diagnoses:  Diabetic ketoacidosis without coma associated with type 1 diabetes mellitus (HCC)    Rx / DC Orders ED Discharge Orders     None      Portions of this report may have been transcribed using voice recognition software. Every effort was made to ensure accuracy; however, inadvertent computerized transcription errors may be present.    Jeanella Flattery 02/01/23 1745    Rolan Bucco, MD 02/01/23 1925

## 2023-02-01 NOTE — ED Triage Notes (Addendum)
Patient BIB [facility] from home. Has not been able to pay for insulin. Type 1 diabetes. Has been vomiting since today. CBG 385.  EMS 130/80 HR 120 97% room air 18G left AC fluid

## 2023-02-01 NOTE — H&P (Signed)
History and Physical    Ricky Shaw VQQ:[id number] DOB: April 23, 2001 DOA: 02/01/2023  PCP: [name], MD  Patient coming from: Home  I have personally briefly reviewed patient's old medical records available.   Chief Complaint: Nausea, headache and high blood sugars  HPI: Ricky Shaw is a 22 y.o. male with medical history significant of type 1 diabetes and hypertension who is supposed to be on insulin pump and last few days trying to ration off his insulin through the pump because he is not able to get any refills woke up well today, at about 11 he started feeling nausea but no vomiting, dry heaving, dull moderate headache.  Also associated palpitations and feeling bad. He realized he is in DKA, he called EMS.  Blood sugar was 385.  He was given fluid boluses and brought to the ER. ED Course: Hemodynamically stable.  Tachycardic.  pH 7.07, anion gap 20, blood sugars 486.  Given isotonic fluid boluses and started on insulin.  Admission requested. Patient denies any fever, recent [name] symptoms.  Denies any other issues including abdominal pain or diarrhea.  Denies any dehydration.   Patient tells me that he has some complex issues with insurance and they are not supplying appropriate insulin doses for him. He feels much better after receiving fluid.  Review of Systems: all systems are reviewed and pertinent positive as per HPI otherwise rest are negative.    Past Medical History:  Diagnosis Date   Diabetes mellitus 11/14/2011   Diabetes mellitus without complication (HCC)    [employer] 03/03/2020    Past Surgical History:  Procedure Laterality Date   nasal cauterization     NASAL HEMORRHAGE CONTROL  05/14/2006    Social history   reports that he has never smoked. He has never used smokeless tobacco. He reports current drug use. Drug: Marijuana. He reports that he does not drink alcohol.  Allergies  Allergen Reactions   Amoxicillin Hives    Family History   Problem Relation Age of Onset   Asthma Maternal Aunt    Cancer Maternal Grandfather    Diabetes Paternal Grandmother    Hypertension Other    Thyroid disease Neg Hx      Prior to Admission medications   Medication Sig Start Date End Date Taking? Authorizing Provider  lisinopril (ZESTRIL) 5 MG tablet Take 1 tablet (5 mg total) by mouth daily. 11/12/22  Yes Gretchen Short, NP  Continuous Glucose Sensor (DEXCOM G6 SENSOR) MISC USE AS DIRECTED 12/17/22   Gretchen Short, NP  Continuous Glucose Transmitter (DEXCOM G6 TRANSMITTER) MISC Use as directed 08/29/22   Zigmund Daniel., MD  insulin lispro (HUMALOG) 100 UNIT/ML injection USE 300 UNITS VIA INSULIN PUMP EVERY 48 HOURS 02/01/23   Gretchen Short, NP  insulin lispro (HUMALOG) 100 UNIT/ML KwikPen Inject 10 Units into the skin 3 (three) times daily. ONLY use in case of pump failure.  Refer to instructions for how to take and what amount. Patient not taking: Reported on 09/20/2022 08/29/22   Zigmund Daniel., MD  Insulin Pen Needle 32G X 4 MM MISC 1 applicator by Does not apply route 3 (three) times daily. Inject up to 5 x per day. Patient not taking: Reported on 09/20/2022 12/12/21   Gretchen Short, NP    Physical Exam: Vitals:   02/01/23 1440 02/01/23 1441 02/01/23 1600  BP: (!) 139/91  (!) 115/54  Pulse: (!) 126  (!) 113  Resp: (!) 24  (!) 22  Temp: 98.1  F (36.7 C)    TempSrc: Oral    SpO2: 99%  100%  Weight:  80 kg   Height:  5\' 6"  (1.676 m)     Constitutional: NAD, calm, comfortable, he does have ketotic smell. Vitals:   02/01/23 1440 02/01/23 1441 02/01/23 1600  BP: (!) 139/91  (!) 115/54  Pulse: (!) 126  (!) 113  Resp: (!) 24  (!) 22  Temp: 98.1 F (36.7 C)    TempSrc: Oral    SpO2: 99%  100%  Weight:  80 kg   Height:  5\' 6"  (1.676 m)    Eyes: PERRL, lids and conjunctivae normal ENMT: Mucous membranes are moist. Posterior pharynx clear of any exudate or lesions.Normal dentition.  Neck: normal, supple, no  masses, no thyromegaly Respiratory: clear to auscultation bilaterally, no wheezing, no crackles. Normal respiratory effort. No accessory muscle use.  Cardiovascular: Regular rate and rhythm, no murmurs / rubs / gallops. No extremity edema. 2+ pedal pulses. No carotid bruits.  Abdomen: no tenderness, no masses palpated. No hepatosplenomegaly. Bowel sounds positive.  Musculoskeletal: no clubbing / cyanosis. No joint deformity upper and lower extremities. Good ROM, no contractures. Normal muscle tone.  Skin: no rashes, lesions, ulcers. No induration Neurologic: CN 2-12 grossly intact. Sensation intact, DTR normal. Strength 5/5 in all 4.  Psychiatric: Normal judgment and insight. Alert and oriented x 3. Normal mood.     Labs on Admission: I have personally reviewed following labs and imaging studies  CBC: Recent Labs  Lab 02/01/23 1500 02/01/23 1505  WBC 12.7*  --   HGB 15.2 17.0  HCT 47.4 50.0  MCV 89.3  --   PLT 298  --    Basic Metabolic Panel: Recent Labs  Lab 02/01/23 1500 02/01/23 1505  NA 132* 133*  K 5.0 5.0  CL 100 105  CO2 11*  --   GLUCOSE 493* 486*  BUN 16 16  CREATININE 1.13 0.90  CALCIUM 9.4  --    GFR: Estimated Creatinine Clearance: 130.2 mL/min (by C-G formula based on SCr of 0.9 mg/dL). Liver Function Tests: Recent Labs  Lab 02/01/23 1500  AST 16  ALT 16  ALKPHOS 65  BILITOT 1.4*  PROT 8.1  ALBUMIN 4.4   Recent Labs  Lab 02/01/23 1500  LIPASE 16   No results for input(s): "AMMONIA" in the last 168 hours. Coagulation Profile: No results for input(s): "INR", "PROTIME" in the last 168 hours. Cardiac Enzymes: No results for input(s): "CKTOTAL", "CKMB", "CKMBINDEX", "TROPONINI" in the last 168 hours. BNP (last 3 results) No results for input(s): "PROBNP" in the last 8760 hours. HbA1C: No results for input(s): "HGBA1C" in the last 72 hours. CBG: Recent Labs  Lab 02/01/23 1438 02/01/23 1708  GLUCAP 494* 499*   Lipid Profile: No results  for input(s): "CHOL", "HDL", "LDLCALC", "TRIG", "CHOLHDL", "LDLDIRECT" in the last 72 hours. Thyroid Function Tests: No results for input(s): "TSH", "T4TOTAL", "FREET4", "T3FREE", "THYROIDAB" in the last 72 hours. Anemia Panel: No results for input(s): "VITAMINB12", "FOLATE", "FERRITIN", "TIBC", "IRON", "RETICCTPCT" in the last 72 hours. Urine analysis:    Component Value Date/Time   COLORURINE STRAW (A) 02/01/2023 1608   APPEARANCEUR CLEAR 02/01/2023 1608   LABSPEC 1.026 02/01/2023 1608   PHURINE 5.0 02/01/2023 1608   GLUCOSEU >=500 (A) 02/01/2023 1608   HGBUR SMALL (A) 02/01/2023 1608   BILIRUBINUR NEGATIVE 02/01/2023 1608   KETONESUR 80 (A) 02/01/2023 1608   PROTEINUR 30 (A) 02/01/2023 1608   UROBILINOGEN 0.2 09/04/2014 1240  NITRITE NEGATIVE 02/01/2023 1608   LEUKOCYTESUR NEGATIVE 02/01/2023 1608    Radiological Exams on Admission: No results found.  EKG: Independently reviewed.  Sinus tachycardia, QTc 440.  Assessment/Plan Principal Problem:   DKA, type 1 (HCC)     Type 1 diabetes that is uncontrolled, diabetic ketoacidosis: Diabetic ketoacidosis: Admit to monitored unit given severity of symptoms.  Will admit to ICU. Vital signs every 4 hours. N.p.o. until anion gap is closed. Blood sugars every hour until on IV insulin. BMP every 4 hours until on IV insulin.  Keep potassium 3.5-5, supplement as per protocol. Check magnesium and phosphorus. Bolus IV fluids given in the ER. Keep on isotonic saline until blood sugars more than 250. When blood sugars less than 250, changed to 5% dextrose and continue insulin regimen. Will transition to subcu insulin once patient is able to take by mouth as well anion gap is closed. Hemoglobin A1c, no recent A1c available.  Will check.  Consult diabetic coordinator to coordinate with supplier/PCP to find root cause of his not getting insulin.   2.  Essential hypertension: On lisinopril.  Holding lisinopril due to dehydration.   Will monitor.  3.  Pseudohyponatremia: Corrected sodium normal.   DVT prophylaxis: Lovenox subcu Code Status: Full code Family Communication: None at the bedside Disposition Plan: Home Consults called: None Admission status: Observation, ICU   Dorcas Carrow MD [facility] Pager [phone]

## 2023-02-01 NOTE — ED Notes (Signed)
Glucose 242

## 2023-02-01 NOTE — ED Notes (Signed)
Lab to add on BHA

## 2023-02-01 NOTE — ED Notes (Signed)
ED TO INPATIENT HANDOFF REPORT  Name/Age/Gender Ricky Shaw 22 y.o. male  Code Status    Code Status Orders  (From admission, onward)           Start     Ordered   02/01/23 1807  Full code  Continuous       Question:  By:  Answer:  Consent: discussion documented in EHR   02/01/23 1807           Code Status History     Date Active Date Inactive Code Status Order ID Comments User Context   08/28/2022 1005 08/29/2022 2302 Full Code [id number]  Maryln Gottron, MD ED   12/08/2021 1306 12/11/2021 2003 Full Code [id number]  Rehman, Areeg N, DO ED   03/21/2021 1438 03/23/2021 1458 Full Code [id number]  Emeline General, MD ED   04/20/2020 1056 04/21/2020 2203 Full Code [id number]  Collene Gobble I, MD ED   08/19/2016 1713 08/21/2016 1413 Full Code [id number]  Hilzendager, Suzan Slick, MD ED   09/04/2014 1325 09/07/2014 2017 Full Code [id number]  Glee Arvin, MD ED   07/28/2014 1212 07/30/2014 1759 Full Code [id number]  Higinio Plan, MD ED   11/14/2011 0205 11/17/2011 1734 Full Code [id number]  Permar, Barkley Bruns, MD ED       Home/SNF/Other Home  Chief Complaint DKA, type 1 (HCC) [E10.10]  Level of Care/Admitting Diagnosis ED Disposition     ED Disposition  Admit   Condition  --   Comment  Hospital Area: [facility] [100102]  Level of Care: ICU [6]  May place patient in observation at [facility] or [facility] if equivalent level of care is available:: Yes  Covid Evaluation: Asymptomatic - no recent exposure (last 10 days) testing not required  Diagnosis: DKA, type 1 [facility]) [875643]  Admitting Physician: Dorcas Carrow [[id number]]  Attending Physician: Dorcas Carrow [[id number]]          Medical History Past Medical History:  Diagnosis Date   Diabetes mellitus 11/14/2011   Diabetes mellitus without complication (HCC)    Phreesia 03/03/2020    Allergies Allergies  Allergen Reactions   Amoxicillin Hives    IV Location/Drains/Wounds Patient  Lines/Drains/Airways Status     Active Line/Drains/Airways     Name Placement date Placement time Site Days   Peripheral IV 02/01/23 18 G Left Antecubital 02/01/23  1500  Antecubital  less than 1   Peripheral IV 02/01/23 20 G 1" Right Antecubital 02/01/23  1732  Antecubital  less than 1            Labs/Imaging Results for orders placed or performed during the hospital encounter of 02/01/23 (from the past 48 hour(s))  CBG monitoring, ED     Status: Abnormal   Collection Time: 02/01/23  2:38 PM  Result Value Ref Range   Glucose-Capillary 494 (H) 70 - 99 mg/dL    Comment: Glucose reference range applies only to samples taken after fasting for at least 8 hours.  CBC     Status: Abnormal   Collection Time: 02/01/23  3:00 PM  Result Value Ref Range   WBC 12.7 (H) 4.0 - 10.5 K/uL   RBC 5.31 4.22 - 5.81 MIL/uL   Hemoglobin 15.2 13.0 - 17.0 g/dL   HCT 01.6 01.0 - 93.2 %   MCV 89.3 80.0 - 100.0 fL   MCH 28.6 26.0 - 34.0 pg   MCHC 32.1 30.0 - 36.0 g/dL   RDW 35.5 73.2 - 20.2 %  Platelets 298 150 - 400 K/uL   nRBC 0.0 0.0 - 0.2 %    Comment: Performed at [facility], [address], [place], [place] [address]  Comprehensive metabolic panel     Status: Abnormal   Collection Time: 02/01/23  3:00 PM  Result Value Ref Range   Sodium 132 (L) 135 - 145 mmol/L   Potassium 5.0 3.5 - 5.1 mmol/L   Chloride 100 98 - 111 mmol/L   CO2 11 (L) 22 - 32 mmol/L   Glucose, Bld 493 (H) 70 - 99 mg/dL    Comment: Glucose reference range applies only to samples taken after fasting for at least 8 hours.   BUN 16 6 - 20 mg/dL   Creatinine, Ser 6.04 0.61 - 1.24 mg/dL   Calcium 9.4 8.9 - 54.0 mg/dL   Total Protein 8.1 6.5 - 8.1 g/dL   Albumin 4.4 3.5 - 5.0 g/dL   AST 16 15 - 41 U/L   ALT 16 0 - 44 U/L   Alkaline Phosphatase 65 38 - 126 U/L   Total Bilirubin 1.4 (H) 0.3 - 1.2 mg/dL   GFR, Estimated >98 >11 mL/min    Comment: (NOTE) Calculated using the CKD-EPI Creatinine Equation  (2021)    Anion gap 21 (H) 5 - 15    Comment: ELECTROLYTES REPEATED TO VERIFY Performed at [facility], [address] [place], [place] [address]   Lipase, blood     Status: None   Collection Time: 02/01/23  3:00 PM  Result Value Ref Range   Lipase 16 11 - 51 U/L    Comment: Performed at [facility], [address] [place], [place] [address]  Beta-hydroxybutyric acid     Status: Abnormal   Collection Time: 02/01/23  3:00 PM  Result Value Ref Range   Beta-Hydroxybutyric Acid 7.78 (H) 0.05 - 0.27 mmol/L    Comment: RESULT CONFIRMED BY MANUAL DILUTION Performed at [facility], [address] [place], [place] [address]   I-stat chem 8, ed     Status: Abnormal   Collection Time: 02/01/23  3:05 PM  Result Value Ref Range   Sodium 133 (L) 135 - 145 mmol/L   Potassium 5.0 3.5 - 5.1 mmol/L   Chloride 105 98 - 111 mmol/L   BUN 16 6 - 20 mg/dL   Creatinine, Ser 5.78 0.61 - 1.24 mg/dL   Glucose, Bld 469 (H) 70 - 99 mg/dL    Comment: Glucose reference range applies only to samples taken after fasting for at least 8 hours.   Calcium, Ion 1.19 1.15 - 1.40 mmol/L   TCO2 12 (L) 22 - 32 mmol/L   Hemoglobin 17.0 13.0 - 17.0 g/dL   HCT 62.9 52.8 - 41.3 %  Lactic acid, plasma     Status: None   Collection Time: 02/01/23  3:18 PM  Result Value Ref Range   Lactic Acid, Venous 1.6 0.5 - 1.9 mmol/L    Comment: Performed at [facility], [address] [place], [place] [address]  Urinalysis, Routine w reflex microscopic -Urine, Clean Catch     Status: Abnormal   Collection Time: 02/01/23  4:08 PM  Result Value Ref Range   Color, Urine STRAW (A) YELLOW   APPearance CLEAR CLEAR   Specific Gravity, Urine 1.026 1.005 - 1.030   pH 5.0 5.0 - 8.0   Glucose, UA >=500 (A) NEGATIVE mg/dL   Hgb urine dipstick SMALL (A) NEGATIVE  Bilirubin Urine NEGATIVE NEGATIVE   Ketones, ur 80 (A) NEGATIVE mg/dL   Protein, ur 30 (A)  NEGATIVE mg/dL   Nitrite NEGATIVE NEGATIVE   Leukocytes,Ua NEGATIVE NEGATIVE   RBC / HPF 0-5 0 - 5 RBC/hpf   WBC, UA 0-5 0 - 5 WBC/hpf   Bacteria, UA NONE SEEN NONE SEEN   Squamous Epithelial / HPF 0-5 0 - 5 /HPF   Mucus PRESENT     Comment: Performed at [facility], [address], [place], [place] [address]  CBG monitoring, ED     Status: Abnormal   Collection Time: 02/01/23  5:08 PM  Result Value Ref Range   Glucose-Capillary 499 (H) 70 - 99 mg/dL    Comment: Glucose reference range applies only to samples taken after fasting for at least 8 hours.  Blood gas, venous (at [facility])     Status: Abnormal   Collection Time: 02/01/23  5:17 PM  Result Value Ref Range   pH, Ven 7.07 (LL) 7.25 - 7.43    Comment: CRITICAL RESULT CALLED TO, READ BACK BY AND VERIFIED WITH: L. SINCLAIR, RN 02/01/23 1729 J. COLE    pCO2, Ven 30 (L) 44 - 60 mmHg   pO2, Ven 58 (H) 32 - 45 mmHg   Bicarbonate 8.7 (L) 20.0 - 28.0 mmol/L   Acid-base deficit 20.3 (H) 0.0 - 2.0 mmol/L   O2 Saturation 85.7 %   Patient temperature 37.0     Comment: Performed at [facility], [address], [place], [place] [address]  POC CBG, ED     Status: Abnormal   Collection Time: 02/01/23  6:27 PM  Result Value Ref Range   Glucose-Capillary 361 (H) 70 - 99 mg/dL    Comment: Glucose reference range applies only to samples taken after fasting for at least 8 hours.  CBG monitoring, ED     Status: Abnormal   Collection Time: 02/01/23  7:24 PM  Result Value Ref Range   Glucose-Capillary 322 (H) 70 - 99 mg/dL    Comment: Glucose reference range applies only to samples taken after fasting for at least 8 hours.  CBG monitoring, ED     Status: Abnormal   Collection Time: 02/01/23  8:26 PM  Result Value Ref Range   Glucose-Capillary 242 (H) 70 - 99 mg/dL    Comment: Glucose reference range applies only to samples taken after fasting for at least 8 hours.  Basic metabolic panel     Status:  Abnormal   Collection Time: 02/01/23  8:54 PM  Result Value Ref Range   Sodium 131 (L) 135 - 145 mmol/L   Potassium 4.3 3.5 - 5.1 mmol/L   Chloride 104 98 - 111 mmol/L   CO2 12 (L) 22 - 32 mmol/L   Glucose, Bld 236 (H) 70 - 99 mg/dL    Comment: Glucose reference range applies only to samples taken after fasting for at least 8 hours.   BUN 14 6 - 20 mg/dL   Creatinine, Ser 0.98 0.61 - 1.24 mg/dL   Calcium 8.6 (L) 8.9 - 10.3 mg/dL   GFR, Estimated >11 >91 mL/min    Comment: (NOTE) Calculated using the CKD-EPI Creatinine Equation (2021)    Anion gap 15 5 - 15    Comment: Performed at [facility], [address], [place], [place] [address]  CBG monitoring, ED     Status: Abnormal   Collection Time: 02/01/23  9:28 PM  Result Value  Ref Range   Glucose-Capillary 202 (H) 70 - 99 mg/dL    Comment: Glucose reference range applies only to samples taken after fasting for at least 8 hours.  CBG monitoring, ED     Status: Abnormal   Collection Time: 02/01/23 10:36 PM  Result Value Ref Range   Glucose-Capillary 250 (H) 70 - 99 mg/dL    Comment: Glucose reference range applies only to samples taken after fasting for at least 8 hours.   No results found.  Pending Labs Unresulted Labs (From admission, onward)     Start     Ordered   02/01/23 1815  Hemoglobin A1c  Add-on,   AD        02/01/23 1814   02/01/23 1659  Beta-hydroxybutyric acid  (Diabetes Ketoacidosis (DKA))  Now then every 8 hours,   STAT (with TIMED, URGENT occurrences)      02/01/23 1659   Signed and Held  HIV Antibody (routine testing w rflx)  (HIV Antibody (Routine testing w reflex) panel)  Once,   R        Signed and Held   Signed and Held  Basic metabolic panel  (Diabetes Ketoacidosis (DKA))  STAT Now then every 4 hours ,   R      Signed and Held   Signed and Held  Beta-hydroxybutyric acid  (Diabetes Ketoacidosis (DKA))  Now then every 8 hours,   R      Signed and Held   Signed and Held  Magnesium   (Diabetes Ketoacidosis (DKA))  Once,   R        Signed and Held   Signed and Held  Phosphorus  (Diabetes Ketoacidosis (DKA))  Once,   R        Signed and Held            Vitals/Pain Today's Vitals   02/01/23 2015 02/01/23 2100 02/01/23 2145 02/01/23 2239  BP: 120/73 107/63 (!) 105/53 120/80  Pulse: 97 (!) 105 (!) 108 100  Resp: 18 17 18 18   Temp:    98.8 F (37.1 C)  TempSrc:      SpO2: 100% 99% 99% 100%  Weight:      Height:      PainSc:        Isolation Precautions No active isolations  Medications Medications  insulin regular, human (MYXREDLIN) 100 units/ 100 mL infusion (9.5 Units/hr Intravenous Rate/Dose Change 02/01/23 2238)  lactated ringers infusion (0 mLs Intravenous Paused 02/01/23 2037)  dextrose 5 % in lactated ringers infusion ( Intravenous New Bag/Given 02/01/23 2039)  dextrose 50 % solution 0-50 mL (has no administration in time range)  potassium chloride 10 mEq in 100 mL IVPB (0 mEq Intravenous Stopped 02/01/23 1827)  potassium chloride 10 mEq in 100 mL IVPB (10 mEq Intravenous New Bag/Given 02/01/23 2241)  sodium chloride 0.9 % bolus 1,000 mL (0 mLs Intravenous Stopped 02/01/23 1722)  ondansetron (ZOFRAN) injection 4 mg (4 mg Intravenous Given 02/01/23 1728)  lactated ringers bolus 1,600 mL ( Intravenous Stopped 02/01/23 1923)    Mobility walks

## 2023-02-02 DIAGNOSIS — E1010 Type 1 diabetes mellitus with ketoacidosis without coma: Secondary | ICD-10-CM | POA: Diagnosis not present

## 2023-02-02 LAB — BASIC METABOLIC PANEL
Anion gap: 10 (ref 5–15)
Anion gap: 9 (ref 5–15)
Anion gap: 13 (ref 5–15)
BUN: 12 mg/dL (ref 6–20)
BUN: 10 mg/dL (ref 6–20)
BUN: 10 mg/dL (ref 6–20)
CO2: 17 mmol/L — ABNORMAL LOW (ref 22–32)
CO2: 21 mmol/L — ABNORMAL LOW (ref 22–32)
CO2: 18 mmol/L — ABNORMAL LOW (ref 22–32)
Calcium: 8.7 mg/dL — ABNORMAL LOW (ref 8.9–10.3)
Calcium: 8.8 mg/dL — ABNORMAL LOW (ref 8.9–10.3)
Calcium: 8.9 mg/dL (ref 8.9–10.3)
Chloride: 107 mmol/L (ref 98–111)
Chloride: 106 mmol/L (ref 98–111)
Chloride: 104 mmol/L (ref 98–111)
Creatinine, Ser: 0.87 mg/dL (ref 0.61–1.24)
Creatinine, Ser: 0.87 mg/dL (ref 0.61–1.24)
Creatinine, Ser: 0.75 mg/dL (ref 0.61–1.24)
GFR, Estimated: >60 mL/min (ref >60)
GFR, Estimated: >60 mL/min (ref >60)
GFR, Estimated: >60 mL/min (ref >60)
Glucose, Bld: 133 mg/dL — ABNORMAL HIGH (ref 70–99)
Glucose, Bld: 115 mg/dL — ABNORMAL HIGH (ref 70–99)
Glucose, Bld: 172 mg/dL — ABNORMAL HIGH (ref 70–99)
Potassium: 4.3 mmol/L (ref 3.5–5.1)
Potassium: 3.8 mmol/L (ref 3.5–5.1)
Potassium: 4.1 mmol/L (ref 3.5–5.1)
Sodium: 134 mmol/L — ABNORMAL LOW (ref 135–145)
Sodium: 136 mmol/L (ref 135–145)
Sodium: 135 mmol/L (ref 135–145)

## 2023-02-02 LAB — GLUCOSE, CAPILLARY
Glucose-Capillary: 122 mg/dL — ABNORMAL HIGH (ref 70–99)
Glucose-Capillary: 125 mg/dL — ABNORMAL HIGH (ref 70–99)
Glucose-Capillary: 130 mg/dL — ABNORMAL HIGH (ref 70–99)
Glucose-Capillary: 133 mg/dL — ABNORMAL HIGH (ref 70–99)
Glucose-Capillary: 143 mg/dL — ABNORMAL HIGH (ref 70–99)
Glucose-Capillary: 148 mg/dL — ABNORMAL HIGH (ref 70–99)
Glucose-Capillary: 165 mg/dL — ABNORMAL HIGH (ref 70–99)
Glucose-Capillary: 166 mg/dL — ABNORMAL HIGH (ref 70–99)
Glucose-Capillary: 168 mg/dL — ABNORMAL HIGH (ref 70–99)
Glucose-Capillary: 180 mg/dL — ABNORMAL HIGH (ref 70–99)
Glucose-Capillary: 181 mg/dL — ABNORMAL HIGH (ref 70–99)
Glucose-Capillary: 238 mg/dL — ABNORMAL HIGH (ref 70–99)
Glucose-Capillary: 282 mg/dL — ABNORMAL HIGH (ref 70–99)

## 2023-02-02 LAB — MAGNESIUM: Magnesium: 1.8 mg/dL (ref 1.7–2.4)

## 2023-02-02 LAB — BETA-HYDROXYBUTYRIC ACID
Beta-Hydroxybutyric Acid: 2.16 mmol/L — ABNORMAL HIGH (ref 0.05–0.27)
Beta-Hydroxybutyric Acid: 1.77 mmol/L — ABNORMAL HIGH (ref 0.05–0.27)
Beta-Hydroxybutyric Acid: 2.85 mmol/L — ABNORMAL HIGH (ref 0.05–0.27)

## 2023-02-02 LAB — HIV ANTIBODY (ROUTINE TESTING W REFLEX): HIV Screen 4th Generation wRfx: NONREACTIVE

## 2023-02-02 LAB — HEMOGLOBIN A1C
Hgb A1c MFr Bld: 11.1 % — ABNORMAL HIGH (ref 4.8–5.6)
Mean Plasma Glucose: 271.87 mg/dL

## 2023-02-02 LAB — MRSA NEXT GEN BY PCR, NASAL: MRSA by PCR Next Gen: NOT DETECTED

## 2023-02-02 LAB — PHOSPHORUS: Phosphorus: 3.3 mg/dL (ref 2.5–4.6)

## 2023-02-02 MED ORDER — ACETAMINOPHEN 650 MG RE SUPP: 650.0000 mg | Freq: Four times a day (QID) | RECTAL | Status: DC | PRN

## 2023-02-02 MED ORDER — CHLORHEXIDINE GLUCONATE CLOTH 2 % EX PADS: 6.0000 | MEDICATED_PAD | Freq: Every day | CUTANEOUS | Status: DC

## 2023-02-02 MED ORDER — INSULIN PUMP
Freq: Three times a day (TID) | SUBCUTANEOUS | Status: DC
  Filled 2023-02-02: qty 1

## 2023-02-02 MED ORDER — ACETAMINOPHEN 325 MG PO TABS: 650.0000 mg | ORAL_TABLET | Freq: Four times a day (QID) | ORAL | Status: DC | PRN

## 2023-02-02 MED ORDER — CARMEX CLASSIC LIP BALM EX OINT
TOPICAL_OINTMENT | CUTANEOUS | Status: DC | PRN
  Filled 2023-02-02: qty 10

## 2023-02-02 MED ORDER — ONDANSETRON HCL 4 MG/2ML IJ SOLN: 4.0000 mg | Freq: Four times a day (QID) | INTRAMUSCULAR | Status: DC | PRN

## 2023-02-02 MED ORDER — ONDANSETRON HCL 4 MG PO TABS: 4.0000 mg | ORAL_TABLET | Freq: Four times a day (QID) | ORAL | Status: DC | PRN

## 2023-02-02 MED ORDER — ENOXAPARIN SODIUM 40 MG/0.4ML IJ SOSY
40.0000 mg | PREFILLED_SYRINGE | INTRAMUSCULAR | Status: DC
  Administered 2023-02-02: 40 mg via SUBCUTANEOUS
  Filled 2023-02-02: qty 0.4

## 2023-02-02 MED ORDER — INSULIN ASPART 100 UNIT/ML IJ SOLN: INTRAMUSCULAR | 1 refills | Status: DC

## 2023-02-02 MED FILL — Insulin Aspart Inj Soln 100 Unit/ML: INTRAMUSCULAR | Qty: 10 | Status: AC

## 2023-02-02 NOTE — Inpatient Diabetes Management (Signed)
Inpatient Diabetes Program Recommendations  AACE/ADA: New Consensus Statement on Inpatient Glycemic Control (2015)  Target Ranges:  Prepandial:   less than 140 mg/dL      Peak postprandial:   less than 180 mg/dL (1-2 hours)      Critically ill patients:  140 - 180 mg/dL   Lab Results  Component Value Date   GLUCAP 130 (H) 02/02/2023   HGBA1C 11.1 (H) 02/02/2023    Review of Glycemic Control  Latest Reference Range & Units 02/01/23 23:41 02/02/23 00:44 02/02/23 01:48 02/02/23 03:54 02/02/23 04:58 02/02/23 06:04  Glucose-Capillary 70 - 99 mg/dL 161 (H) 096 (H) 045 (H) 122 (H) 125 (H) 130 (H)   Diabetes history: DM type 1 Outpatient Diabetes medications: Tandem insulin pump with Dexcom G6 sensor and transmitter Basal Rates 12AM 2.10   3am 2.15  6am 2.20   8am 2.50   9pm   2.40     56.85 units per day    Insulin to Carbohydrate Ratio 12AM 8   3am 8   6am 6  8am 5   9pm 5    Insulin Sensitivity Factor 12AM 24  3am 24       6am 25   8am 9pm   20  25       Target Blood Glucose 12AM 110   6am 110  9pm 110                Current orders for Inpatient glycemic control:  Endotool/IV insulin  Endocrinology: Barron Alvine, NP last visit on 6/25  Inpatient Diabetes Program Recommendations:    -   restart insulin pump -   order insulin pump order set  Will need Novolog vial at time of discharge Follow up with Endocrinology on Monday (call office)  Spoke with pt over the phone regarding circumstances around DKA. Pt reports insurance is no longer covering Humalog. Pt reports being covered by [employer].  Pt has all pump supplies at bedside. Pt to restart insulin pump and overlap it with one hour with IV insulin gtt.  Thanks,  Christena Deem RN, MSN, BC-ADM Inpatient Diabetes Coordinator Team Pager [phone] (8a-5p)

## 2023-02-02 NOTE — Discharge Summary (Addendum)
Physician Discharge Summary  Ricky Shaw:[id number] DOB: 2001/04/02  PCP: [name], MD  Admitted from: Home Discharged to: Home  Admit date: 02/01/2023 Discharge date: 02/02/2023  Recommendations for Outpatient Follow-up:    Follow-up Information     [name], MD. Schedule an appointment as soon as possible for a visit in 1 week(s).   Specialty: Pediatrics Why: To be seen with repeat labs (CBC & BMP). Contact information: [address] [place] [address] [phone]         Gretchen Short, NP. Schedule an appointment as soon as possible for a visit on 02/04/2023.   Specialty: Family Medicine Why: Call on Monday morning to discuss your care and for an appointment to be seen early. Contact information: [address] [place] [address] [phone]                  Home Health: None    Equipment/Devices: None    Discharge Condition: Improved and stable.   Code Status: Full Code Diet recommendation:  Discharge Diet Orders (From admission, onward)     Start     Ordered   02/02/23 0000  Diet - low sodium heart healthy        02/02/23 1143   02/02/23 0000  Diet Carb Modified        02/02/23 1143             Discharge Diagnoses:  Principal Problem:   DKA, type 1 (HCC)   Brief Summary: 22 year old male, lives with his mother and sibling, looking for new employment, medical history significant for type I DM on insulin pump for approximately 3 years, HTN, reportedly had been trying to ration his insulins due to difficulty procuring refills (did not know that his refills had expired and prescriptions had to be called in from his MDs office and also some insurance related issues).  On day of admission, at around 11 AM, he started feeling nauseous without vomiting, dry heaving, dull moderate headache, palpitations and feeling bad.  He realized that he was in DKA, and called EMS.  Blood sugar was 385.  He was  given fluid boluses and brought to the ED.  ED course: Afebrile, transiently tachypneic up to 24/min, mild sinus tachycardia mostly in the 100s-110s, normotensive, not hypoxic. Lab work significant for glucose 132, bicarbonate 11, glucose 493, normal BUN and creatinine, anion gap 21, total bilirubin 1.4, WBC 12.7, beta hydroxybutyrate 7.78.  Venous pH of 7.07.  Venous lactate 1.6.  Urine microscopy showed glycosuria and ketones of 80.  Hospital admission was requested for DKA.  Assessment and plan:  DKA in type I DM, not at goal: Questionable compliance to meds.  He was bolused with 2 L of IV fluids, started on maintenance IV fluids, started on insulin drip per DKA protocol, frequent BMP monitoring.  This morning, bicarb was up to 21, anion gap had normalized overnight, good glycemic control on insulin drip, beta hydroxybutyrate down to 1.77.  He was transitioned off of insulin drip to home insulin pump, diet initiated and pending tolerance, will be discharged home with close outpatient follow-up with his endocrinologist.  A1c of 11.1 suggests poor control.  On review of his chart, do not see any A1c less than 9.7 since 2021 at least.  Diabetes coordinator consulted.  As per her discussion with patient, patient reportedly has enough Humalog to use in his insulin pump until end of this month but stated that after that his insurance  will cover NovoLog but not Humalog.  Diabetes coordinator recommended discharging him with NovoLog that he can use after he runs out of the Humalog.  To me the patient indicated that he called [employer] his insurance company about midday on day of admission.  They were questioning that he had a second insurance company and he advised them that he did not.  They told him that they would review his case and get back to him in 24 hours.  Patient is advised to contact his endocrinologist first thing on Monday to coordinate close outpatient follow-up and care including any  issues with insulins etc.  He verbalized understanding.  As per review of records, patient last seen by his endocrinologist on 11/06/2022.  Noted issues with noncompliance.  Addendum:  Through the course of today, there has been quite a bit of confusion as to which of his insulins is covered by his insurance or not i.e. Humalog versus NovoLog.  Patient's ICU RN spent a significant amount of time on the phone calling his pharmacy and maybe even his insurance company.  RN was able to figure out that Humalog is actually covered by patient's insurance but patient forgot to update his supplemental insurance card [employer]) with the pharmacy.  He even had Humalog prescription waiting for him to be picked up provided he updated his supplemental insurance card.  I am informed that this has now been taken care of, Humalog is covered by his insurance and he can pick it up.  Mother was going to pick up the Humalog so that could be used in the pump prior to discharge.  After all of this, discontinued the NovoLog ordered at discharge and called his pharmacy to cancel the order there as well.  Updated patient's RN.  Hypertension: Lisinopril which was temporarily held in the hospital is resumed at discharge.  Consultations: None  Procedures: None   Discharge Instructions  Discharge Instructions     Call MD for:  difficulty breathing, headache or visual disturbances   Complete by: As directed    Call MD for:  extreme fatigue   Complete by: As directed    Call MD for:  persistant dizziness or light-headedness   Complete by: As directed    Call MD for:  persistant nausea and vomiting   Complete by: As directed    Call MD for:  severe uncontrolled pain   Complete by: As directed    Call MD for:  temperature >100.4   Complete by: As directed    Diet - low sodium heart healthy   Complete by: As directed    Diet Carb Modified   Complete by: As directed    Increase activity slowly   Complete by: As  directed         Medication List     TAKE these medications    Dexcom G6 Sensor Misc USE AS DIRECTED   Dexcom G6 Transmitter Misc Use as directed   insulin lispro 100 UNIT/ML KwikPen Commonly known as: HUMALOG Inject 10 Units into the skin 3 (three) times daily. ONLY use in case of pump failure.  Refer to instructions for how to take and what amount. What changed: Another medication with the same name was changed. Make sure you understand how and when to take each. Notes to patient: Backup insulin if pump fails   insulin lispro 100 UNIT/ML injection Commonly known as: HUMALOG USE 300 UNITS VIA INSULIN PUMP EVERY 48 HOURS What changed: See the new  instructions. Notes to patient: Use Novolog instead   Insulin Pen Needle 32G X 4 MM Misc 1 applicator by Does not apply route 3 (three) times daily. Inject up to 5 x per day.   lisinopril 5 MG tablet Commonly known as: ZESTRIL Take 1 tablet (5 mg total) by mouth daily.       Allergies  Allergen Reactions   Amoxicillin Hives      Procedures/Studies: No results found.    Subjective: Seen this morning.  States that he feels fine.  Denies complaints.  Feels back to his baseline.  No headache, visual disturbance, nausea, vomiting, pain, dizziness or lightheadedness.  Hungry and wants something to eat.  Discharge Exam:  Vitals:   02/02/23 1300 02/02/23 1400 02/02/23 1500 02/02/23 1600  BP:      Pulse:  81 96 94  Resp: 18 18 19 14   Temp:      TempSrc:      SpO2:  100% 100% 100%  Weight:      Height:        General: Young male, moderately built and nourished sitting up comfortably in bed without distress.  Oral mucosa moist. Cardiovascular: S1 & S2 heard, RRR, S1/S2 +. No murmurs, rubs, gallops or clicks. No JVD or pedal edema.  Telemetry personally reviewed: Sinus rhythm. Respiratory: Clear to auscultation without wheezing, rhonchi or crackles. No increased work of breathing. Abdominal:  Non distended, non  tender & soft. No organomegaly or masses appreciated. Normal bowel sounds heard. CNS: Alert and oriented. No focal deficits. Extremities: no edema, no cyanosis    The results of significant diagnostics from this hospitalization (including imaging, microbiology, ancillary and laboratory) are listed below for reference.     Microbiology: Recent Results (from the past 240 hour(s))  MRSA Next Gen by PCR, Nasal     Status: None   Collection Time: 02/02/23 12:23 AM   Specimen: Nasal Mucosa; Nasal Swab  Result Value Ref Range Status   MRSA by PCR Next Gen NOT DETECTED NOT DETECTED Final    Comment: (NOTE) The GeneXpert MRSA Assay (FDA approved for NASAL specimens only), is one component of a comprehensive MRSA colonization surveillance program. It is not intended to diagnose MRSA infection nor to guide or monitor treatment for MRSA infections. Test performance is not FDA approved in patients less than 82 years old. Performed at [facility], [address] [place], [place] [address]      Labs: CBC: Recent Labs  Lab 02/01/23 1500 02/01/23 1505  WBC 12.7*  --   HGB 15.2 17.0  HCT 47.4 50.0  MCV 89.3  --   PLT 298  --     Basic Metabolic Panel: Recent Labs  Lab 02/01/23 1500 02/01/23 1505 02/01/23 2054 02/02/23 0247 02/02/23 0659 02/02/23 1120  NA 132* 133* 131* 134* 136 135  K 5.0 5.0 4.3 4.3 3.8 4.1  CL 100 105 104 107 106 104  CO2 11*  --  12* 17* 21* 18*  GLUCOSE 493* 486* 236* 133* 115* 172*  BUN 16 16 14 12 10 10   CREATININE 1.13 0.90 1.04 0.87 0.87 0.75  CALCIUM 9.4  --  8.6* 8.7* 8.8* 8.9  MG  --   --   --  1.8  --   --   PHOS  --   --   --  3.3  --   --     Liver Function Tests: Recent Labs  Lab 02/01/23 1500  AST 16  ALT 16  ALKPHOS 65  BILITOT 1.4*  PROT 8.1  ALBUMIN 4.4    CBG: Recent Labs  Lab 02/02/23 1203 02/02/23 1239 02/02/23 1340 02/02/23 1450 02/02/23 1650  GLUCAP 165* 143* 168* 282* 238*    Hgb  A1c Recent Labs    02/02/23 0247  HGBA1C 11.1*     Urinalysis    Component Value Date/Time   COLORURINE STRAW (A) 02/01/2023 1608   APPEARANCEUR CLEAR 02/01/2023 1608   LABSPEC 1.026 02/01/2023 1608   PHURINE 5.0 02/01/2023 1608   GLUCOSEU >=500 (A) 02/01/2023 1608   HGBUR SMALL (A) 02/01/2023 1608   BILIRUBINUR NEGATIVE 02/01/2023 1608   KETONESUR 80 (A) 02/01/2023 1608   PROTEINUR 30 (A) 02/01/2023 1608   UROBILINOGEN 0.2 09/04/2014 1240   NITRITE NEGATIVE 02/01/2023 1608   LEUKOCYTESUR NEGATIVE 02/01/2023 1608      Time coordinating discharge: 25 minutes  SIGNED:  Marcellus Scott, MD,  FACP, [facility], [facility], [facility]   [facility] [place]     To contact the attending provider between 7A-7P or the covering provider during after hours 7P-7A, please log into the web site [email or web address] and access using universal [place] password for that web site. If you do not have the password, please call the hospital operator.

## 2023-02-02 NOTE — TOC Initial Note (Signed)
Transition of Care [facility]) - Initial/Assessment Note    Patient Details  Name: Ricky Shaw MRN: [id number] Date of Birth: [birth year]  Transition [facility]) CM/SW Contact:    Adrian Prows, RN Phone Number: 02/02/2023, 1:53 PM  Clinical Narrative:                 [facility] consult for medication assistance; spoke w/ pt in room; pt says he is from home; he plans to return at d/c; he identified POC mother Aeden Hassinger [phone]); he denies SDOH risks; pt says he does not have glasses, dentures or HA; pt says he does not have DME, HH services, or home oxygen; he has transportation; pt says he went to pharmacy and his Humalog and Descom is not covered under [facility]; pt says he has [employer]; pt advised to give pharmacy updated insurance card for meds; he verbalized understanding; no TOC needs.  Expected Discharge Plan: Home/Self Care Barriers to Discharge: No Barriers Identified   Patient Goals and CMS Choice Patient states their goals for this hospitalization and ongoing recovery are:: home          Expected Discharge Plan and Services   Discharge Planning Services: CM Consult   Living arrangements for the past 2 months: Single Family Home Expected Discharge Date: 02/02/23               DME Arranged: N/A DME Agency: NA       HH Arranged: NA HH Agency: NA        Prior Living Arrangements/Services Living arrangements for the past 2 months: Single Family Home Lives with:: Parents Patient language and need for interpreter reviewed:: Yes Do you feel safe going back to the place where you live?: Yes        Care giver support system in place?: Yes (comment) Current home services:  (n/a) Criminal Activity/Legal Involvement Pertinent to Current Situation/Hospitalization: No - Comment as needed  Activities of Daily Living Home Assistive Devices/Equipment: None ADL Screening (condition at time of admission) Patient's cognitive ability adequate to safely  complete daily activities?: Yes Is the patient deaf or have difficulty hearing?: No Does the patient have difficulty seeing, even when wearing glasses/contacts?: No Does the patient have difficulty concentrating, remembering, or making decisions?: No Patient able to express need for assistance with ADLs?: No Does the patient have difficulty dressing or bathing?: No Independently performs ADLs?: Yes (appropriate for developmental age) Does the patient have difficulty walking or climbing stairs?: No Weakness of Legs: None Weakness of Arms/Hands: None  Permission Sought/Granted Permission sought to share information with : Case Manager Permission granted to share information with : Yes, Verbal Permission Granted  Share Information with NAME: Case Manager     Permission granted to share info w Relationship: Zafir Mischel (mother) [phone]     Emotional Assessment Appearance:: Appears stated age Attitude/Demeanor/Rapport: Gracious Affect (typically observed): Accepting Orientation: : Oriented to Self, Oriented to Place, Oriented to  Time, Oriented to Situation Alcohol / Substance Use: Not Applicable Psych Involvement: No (comment)  Admission diagnosis:  DKA, type 1 (HCC) [E10.10] Diabetic ketoacidosis without coma associated with type 1 diabetes mellitus (HCC) [E10.10] Patient Active Problem List   Diagnosis Date Noted   DKA, type 1 (HCC) 02/01/2023   Hyperkalemia 08/28/2022   Pseudohyponatremia 08/28/2022   DKA (diabetic ketoacidosis) (HCC) 04/20/2020   AKI (acute kidney injury) (HCC) 04/20/2020   Insulin pump in place 07/08/2017   Hyperglycemia    Insulin pump titration 11/01/2015  Noncompliance with diabetes treatment    Uncontrolled type 1 diabetes mellitus with hyperglycemia (HCC) 07/29/2014   Hypoglycemia unawareness in type 1 diabetes mellitus (HCC) 04/20/2014   Polyuria 11/14/2011   PCP:  [name], MD Pharmacy:   [id number] [place],  [place] - [phone] AT [facility] & [address] [place] [address] Phone: [phone] Fax: [phone]  [facility] [address] [place] [address] Phone: [phone] Fax: [phone]     Social Determinants of Health (SDOH) Social History: SDOH Screenings   Food Insecurity: No Food Insecurity (02/02/2023)  Housing: Low Risk  (02/02/2023)  Transportation Needs: No Transportation Needs (02/02/2023)  Utilities: Not At Risk (02/02/2023)  Depression (PHQ2-9): Low Risk  (03/04/2020)  Recent Concern: Depression (PHQ2-9) - Medium Risk (12/23/2019)  Tobacco Use: Low Risk  (02/01/2023)   SDOH Interventions: Food Insecurity Interventions: Intervention Not Indicated, Inpatient TOC Housing Interventions: Intervention Not Indicated, Inpatient TOC Transportation Interventions: Intervention Not Indicated, Inpatient TOC Utilities Interventions: Intervention Not Indicated, Inpatient TOC   Readmission Risk Interventions     No data to display

## 2023-02-02 NOTE — Plan of Care (Signed)
Problem: Education: Goal: Ability to describe self-care measures that may prevent or decrease complications (Diabetes Survival Skills Education) will improve Outcome: Adequate for Discharge Goal: Individualized Educational Video(s) Outcome: Adequate for Discharge   Problem: Coping: Goal: Ability to adjust to condition or change in health will improve Outcome: Adequate for Discharge   Problem: Fluid Volume: Goal: Ability to maintain a balanced intake and output will improve Outcome: Adequate for Discharge   Problem: Health Behavior/Discharge Planning: Goal: Ability to identify and utilize available resources and services will improve Outcome: Adequate for Discharge Goal: Ability to manage health-related needs will improve Outcome: Adequate for Discharge   Problem: Metabolic: Goal: Ability to maintain appropriate glucose levels will improve Outcome: Adequate for Discharge   Problem: Nutritional: Goal: Maintenance of adequate nutrition will improve Outcome: Adequate for Discharge Goal: Progress toward achieving an optimal weight will improve Outcome: Adequate for Discharge   Problem: Skin Integrity: Goal: Risk for impaired skin integrity will decrease Outcome: Adequate for Discharge   Problem: Tissue Perfusion: Goal: Adequacy of tissue perfusion will improve Outcome: Adequate for Discharge   Problem: Education: Goal: Ability to describe self-care measures that may prevent or decrease complications (Diabetes Survival Skills Education) will improve Outcome: Adequate for Discharge Goal: Individualized Educational Video(s) Outcome: Adequate for Discharge   Problem: Cardiac: Goal: Ability to maintain an adequate cardiac output will improve Outcome: Adequate for Discharge   Problem: Health Behavior/Discharge Planning: Goal: Ability to identify and utilize available resources and services will improve Outcome: Adequate for Discharge Goal: Ability to manage health-related  needs will improve Outcome: Adequate for Discharge   Problem: Fluid Volume: Goal: Ability to achieve a balanced intake and output will improve Outcome: Adequate for Discharge   Problem: Metabolic: Goal: Ability to maintain appropriate glucose levels will improve Outcome: Adequate for Discharge   Problem: Nutritional: Goal: Maintenance of adequate nutrition will improve Outcome: Adequate for Discharge Goal: Maintenance of adequate weight for body size and type will improve Outcome: Adequate for Discharge   Problem: Respiratory: Goal: Will regain and/or maintain adequate ventilation Outcome: Adequate for Discharge   Problem: Urinary Elimination: Goal: Ability to achieve and maintain adequate renal perfusion and functioning will improve Outcome: Adequate for Discharge   Problem: Education: Goal: Knowledge of General Education information will improve Description: Including pain rating scale, medication(s)/side effects and non-pharmacologic comfort measures Outcome: Adequate for Discharge   Problem: Health Behavior/Discharge Planning: Goal: Ability to manage health-related needs will improve Outcome: Adequate for Discharge   Problem: Clinical Measurements: Goal: Ability to maintain clinical measurements within normal limits will improve Outcome: Adequate for Discharge Goal: Will remain free from infection Outcome: Adequate for Discharge Goal: Diagnostic test results will improve Outcome: Adequate for Discharge Goal: Respiratory complications will improve Outcome: Adequate for Discharge Goal: Cardiovascular complication will be avoided Outcome: Adequate for Discharge   Problem: Activity: Goal: Risk for activity intolerance will decrease Outcome: Adequate for Discharge   Problem: Nutrition: Goal: Adequate nutrition will be maintained Outcome: Adequate for Discharge   Problem: Coping: Goal: Level of anxiety will decrease Outcome: Adequate for Discharge   Problem:  Elimination: Goal: Will not experience complications related to bowel motility Outcome: Adequate for Discharge Goal: Will not experience complications related to urinary retention Outcome: Adequate for Discharge   Problem: Pain Managment: Goal: General experience of comfort will improve Outcome: Adequate for Discharge   Problem: Safety: Goal: Ability to remain free from injury will improve Outcome: Adequate for Discharge   Problem: Skin Integrity: Goal: Risk for impaired skin integrity will decrease  Outcome: Adequate for Discharge

## 2023-02-02 NOTE — Discharge Instructions (Signed)

## 2023-02-04 LAB — GLUCOSE, CAPILLARY: Glucose-Capillary: 92 mg/dL (ref 70–99)

## 2023-04-10 ENCOUNTER — Telehealth (INDEPENDENT_AMBULATORY_CARE_PROVIDER_SITE_OTHER): Payer: Self-pay | Admitting: Family

## 2023-04-10 NOTE — Telephone Encounter (Signed)
  Name of who is calling: Ricky Shaw   Caller's Relationship to Patient: self   Best contact number: [phone]  Provider they see: Ovidio Kin  Reason for call: Patient needs prior auth on dexcom g6 transmitter and sensors      PRESCRIPTION REFILL ONLY  Name of prescription: dexcom G6 Sensor and dexcom G6 transmitter  Pharmacy: [facility]

## 2023-04-15 NOTE — Telephone Encounter (Signed)
Received approval fax, approved from 04/13/23 through 04/12/24  Faxed determination to [facility]

## 2023-04-24 ENCOUNTER — Encounter (INDEPENDENT_AMBULATORY_CARE_PROVIDER_SITE_OTHER): Payer: Self-pay

## 2023-06-17 ENCOUNTER — Ambulatory Visit (INDEPENDENT_AMBULATORY_CARE_PROVIDER_SITE_OTHER): Payer: 59 | Admitting: Family

## 2023-06-17 NOTE — Progress Notes (Deleted)
 Pediatric Endocrinology Diabetes Consultation Follow-up Visit  Ricky Shaw 19-Jul-2000 [id number]  Chief Complaint: Follow-up type 1 diabetes   [name], MD   HPI: Ricky Shaw  is a 23 y.o. male presenting for follow-up of type 1 diabetes. he is accompanied to this visit by his grandmother  .  1. Ricky Shaw was admitted to the pediatric ward at [facility] on 11/13/11 for the above chief complaint. He had had polyuria and polydipsia. His initial BG was 340. Initial serum glucose was 354. Serum CO2 was 18. Venous pH was 7.367.  Hemoglobin A1c was 13.3%. Serum C-peptide was 0.68 (normal 0.80-390). Urine glucose was >1000 and urine ketones were >80. Anti-islet cell antibody was 40 (normal <5). Insulin antibodies were 5.0 (normal <0.4).Anti-GAD antibody was negative at <1.0. TSH was 2.344, free T4 1.36, free T3 3.5.  Tissue transglutaminase IgA was 2.2 (normal <20). Gliadin IgA antibodies were 4.9 (normal <20). He was started on Lantus as a basal insulin and Novolog as a bolus insulin at mealtimes, and also at bedtime and 2 AM if needed. After receiving IV fluids, insulins, and DM education, he was discharged on 11/17/11.   2. Since last visit to [facility] on 10/2022, he has been well.   He has been staying busy at work. Reports that a friend recently passed away from a drug overdose. He is having a hard time dealing with the loss, using his friends and family to talk to.   Using Tandem insulin pump and Dexcom CGM, he reports wearing both consistently. Occasionally has pump site failures but he monitors closely. He boluses before eating about 50% of the time. Estimates his carbs, he feels 60-70 grams per meal is normal. Hypoglycemia does not occur often, none severe. He is able to feel symptoms when under 80.    Insulin regimen: Tsim insulin pump   Basal Rates 12AM 2.10  3am 2.15  6am 2.20   8am 2.50   9pm  2.40    56.85  units per day   Insulin to Carbohydrate Ratio 12AM 8   3am 8   6am  6  8am 5   9pm 5    Insulin Sensitivity Factor 12AM 24  3am 24     6am 25   8am 9pm  20  25     Target Blood Glucose 12AM 110   6am 110  9pm 110          Hypoglycemia: Is consistently able to feel low blood sugars.practice. No glucagon needed.  Insulin pump download:     Med-alert ID: Bracelet  Injection sites: legs, abdomen  Annual labs due: 2024-->ordered  Ophthalmology due: 2023. Advised he needs to go annually.     3. ROS: Greater than 10 systems reviewed with pertinent positives listed in HPI, otherwise neg. Constitutional: Sleeping well.  Eyes: No changes in vision. No blurry vision.  Ears/Nose/Mouth/Throat: No difficulty swallowing. Denies neck pain  Cardiovascular: No palpitations. Denies chest pain  Respiratory: No increased work of breathing. Denies SOB   Neurologic: Normal sensation, no tremor GI: No abdominal pain. No constipation/diarrhea.  Endocrine: No polydipsia.  No hyperpigmentation Psychiatric: Normal affect today. Denies depression and anxiety   Past Medical History:   Past Medical History:  Diagnosis Date   Diabetes mellitus 11/14/2011   Diabetes mellitus without complication (HCC)    Phreesia 03/03/2020    Medications:  Outpatient Encounter Medications as of 06/17/2023  Medication Sig Note   Continuous Glucose Sensor (DEXCOM G6 SENSOR) MISC USE AS DIRECTED  Continuous Glucose Transmitter (DEXCOM G6 TRANSMITTER) MISC Use as directed    insulin lispro (HUMALOG) 100 UNIT/ML injection USE 300 UNITS VIA INSULIN PUMP EVERY 48 HOURS    insulin lispro (HUMALOG) 100 UNIT/ML KwikPen Inject 10 Units into the skin 3 (three) times daily. ONLY use in case of pump failure.  Refer to instructions for how to take and what amount. (Patient not taking: Reported on 09/20/2022) 02/01/2023: Pt stated he needs this, but can't get it due to insurance. For if pump fails as backup.   Insulin Pen Needle 32G X 4 MM MISC 1 applicator by Does not apply route 3  (three) times daily. Inject up to 5 x per day. (Patient not taking: Reported on 09/20/2022)    lisinopril (ZESTRIL) 5 MG tablet Take 1 tablet (5 mg total) by mouth daily.    No facility-administered encounter medications on file as of 06/17/2023.    Allergies: Allergies  Allergen Reactions   Amoxicillin Hives    Surgical History: Past Surgical History:  Procedure Laterality Date   nasal cauterization     NASAL HEMORRHAGE CONTROL  05/14/2006    Family History:  Family History  Problem Relation Age of Onset   Asthma Maternal Aunt    Cancer Maternal Grandfather    Diabetes Paternal Grandmother    Hypertension Other    Thyroid disease Neg Hx       Social History: Lives with: mother  Graduated. Unemployed currently   Physical Exam:  There were no vitals filed for this visit.     There were no vitals taken for this visit. Body mass index: body mass index is unknown because there is no height or weight on file. Growth %ile SmartLinks can only be used for patients less than 41 years old.  Ht Readings from Last 3 Encounters:  02/02/23 5\' 7"  (1.702 m)  08/28/22 5\' 6"  (1.676 m)  12/08/21 5\' 7"  (1.702 m) (18%, Z= -0.92)*   * Growth percentiles are based on CDC (Boys, 2-20 Years) data.   Wt Readings from Last 3 Encounters:  02/02/23 154 lb 5.2 oz (70 kg)  11/06/22 172 lb 3.2 oz (78.1 kg)  09/20/22 180 lb (81.6 kg)    PHYSICAL EXAM:  General: Well developed, well nourished male in no acute distress.   Head: Normocephalic, atraumatic.   Eyes:  Pupils equal and round. EOMI.  Sclera white.  No eye drainage.   Ears/Nose/Mouth/Throat: Nares patent, no nasal drainage.  Normal dentition, mucous membranes moist.  Neck: supple, no cervical lymphadenopathy, no thyromegaly Cardiovascular: regular rate, normal S1/S2, no murmurs Respiratory: No increased work of breathing.  Lungs clear to auscultation bilaterally.  No wheezes. Abdomen: soft, nontender, nondistended. Normal bowel  sounds.  No appreciable masses  Extremities: warm, well perfused, cap refill < 2 sec.   Musculoskeletal: Normal muscle mass.  Normal strength Skin: warm, dry.  No rash or lesions. Neurologic: alert and oriented, normal speech, no tremor  Labs:  Results for orders placed or performed during the hospital encounter of 02/01/23  CBG monitoring, ED   Collection Time: 02/01/23  2:38 PM  Result Value Ref Range   Glucose-Capillary 494 (H) 70 - 99 mg/dL  CBC   Collection Time: 02/01/23  3:00 PM  Result Value Ref Range   WBC 12.7 (H) 4.0 - 10.5 K/uL   RBC 5.31 4.22 - 5.81 MIL/uL   Hemoglobin 15.2 13.0 - 17.0 g/dL   HCT 23.7 62.8 - 31.5 %   MCV 89.3 80.0 - 100.0  fL   MCH 28.6 26.0 - 34.0 pg   MCHC 32.1 30.0 - 36.0 g/dL   RDW 16.1 09.6 - 04.5 %   Platelets 298 150 - 400 K/uL   nRBC 0.0 0.0 - 0.2 %  Comprehensive metabolic panel   Collection Time: 02/01/23  3:00 PM  Result Value Ref Range   Sodium 132 (L) 135 - 145 mmol/L   Potassium 5.0 3.5 - 5.1 mmol/L   Chloride 100 98 - 111 mmol/L   CO2 11 (L) 22 - 32 mmol/L   Glucose, Bld 493 (H) 70 - 99 mg/dL   BUN 16 6 - 20 mg/dL   Creatinine, Ser 4.09 0.61 - 1.24 mg/dL   Calcium 9.4 8.9 - 81.1 mg/dL   Total Protein 8.1 6.5 - 8.1 g/dL   Albumin 4.4 3.5 - 5.0 g/dL   AST 16 15 - 41 U/L   ALT 16 0 - 44 U/L   Alkaline Phosphatase 65 38 - 126 U/L   Total Bilirubin 1.4 (H) 0.3 - 1.2 mg/dL   GFR, Estimated >91 >47 mL/min   Anion gap 21 (H) 5 - 15  Lipase, blood   Collection Time: 02/01/23  3:00 PM  Result Value Ref Range   Lipase 16 11 - 51 U/L  Beta-hydroxybutyric acid   Collection Time: 02/01/23  3:00 PM  Result Value Ref Range   Beta-Hydroxybutyric Acid 7.78 (H) 0.05 - 0.27 mmol/L  I-stat chem 8, ed   Collection Time: 02/01/23  3:05 PM  Result Value Ref Range   Sodium 133 (L) 135 - 145 mmol/L   Potassium 5.0 3.5 - 5.1 mmol/L   Chloride 105 98 - 111 mmol/L   BUN 16 6 - 20 mg/dL   Creatinine, Ser 8.29 0.61 - 1.24 mg/dL   Glucose, Bld  562 (H) 70 - 99 mg/dL   Calcium, Ion 1.30 8.65 - 1.40 mmol/L   TCO2 12 (L) 22 - 32 mmol/L   Hemoglobin 17.0 13.0 - 17.0 g/dL   HCT 78.4 69.6 - 29.5 %  Lactic acid, plasma   Collection Time: 02/01/23  3:18 PM  Result Value Ref Range   Lactic Acid, Venous 1.6 0.5 - 1.9 mmol/L  Urinalysis, Routine w reflex microscopic -Urine, Clean Catch   Collection Time: 02/01/23  4:08 PM  Result Value Ref Range   Color, Urine STRAW (A) YELLOW   APPearance CLEAR CLEAR   Specific Gravity, Urine 1.026 1.005 - 1.030   pH 5.0 5.0 - 8.0   Glucose, UA >=500 (A) NEGATIVE mg/dL   Hgb urine dipstick SMALL (A) NEGATIVE   Bilirubin Urine NEGATIVE NEGATIVE   Ketones, ur 80 (A) NEGATIVE mg/dL   Protein, ur 30 (A) NEGATIVE mg/dL   Nitrite NEGATIVE NEGATIVE   Leukocytes,Ua NEGATIVE NEGATIVE   RBC / HPF 0-5 0 - 5 RBC/hpf   WBC, UA 0-5 0 - 5 WBC/hpf   Bacteria, UA NONE SEEN NONE SEEN   Squamous Epithelial / HPF 0-5 0 - 5 /HPF   Mucus PRESENT   CBG monitoring, ED   Collection Time: 02/01/23  5:08 PM  Result Value Ref Range   Glucose-Capillary 499 (H) 70 - 99 mg/dL  Blood gas, venous (at [facility] and [facility])   Collection Time: 02/01/23  5:17 PM  Result Value Ref Range   pH, Ven 7.07 (LL) 7.25 - 7.43   pCO2, Ven 30 (L) 44 - 60 mmHg   pO2, Ven 58 (H) 32 - 45 mmHg   Bicarbonate 8.7 (L) 20.0 -  28.0 mmol/L   Acid-base deficit 20.3 (H) 0.0 - 2.0 mmol/L   O2 Saturation 85.7 %   Patient temperature 37.0   POC CBG, ED   Collection Time: 02/01/23  6:27 PM  Result Value Ref Range   Glucose-Capillary 361 (H) 70 - 99 mg/dL  CBG monitoring, ED   Collection Time: 02/01/23  7:24 PM  Result Value Ref Range   Glucose-Capillary 322 (H) 70 - 99 mg/dL  CBG monitoring, ED   Collection Time: 02/01/23  8:26 PM  Result Value Ref Range   Glucose-Capillary 242 (H) 70 - 99 mg/dL  Basic metabolic panel   Collection Time: 02/01/23  8:54 PM  Result Value Ref Range   Sodium 131 (L) 135 - 145 mmol/L   Potassium 4.3 3.5 - 5.1 mmol/L    Chloride 104 98 - 111 mmol/L   CO2 12 (L) 22 - 32 mmol/L   Glucose, Bld 236 (H) 70 - 99 mg/dL   BUN 14 6 - 20 mg/dL   Creatinine, Ser 1.61 0.61 - 1.24 mg/dL   Calcium 8.6 (L) 8.9 - 10.3 mg/dL   GFR, Estimated >09 >60 mL/min   Anion gap 15 5 - 15  CBG monitoring, ED   Collection Time: 02/01/23  9:28 PM  Result Value Ref Range   Glucose-Capillary 202 (H) 70 - 99 mg/dL  CBG monitoring, ED   Collection Time: 02/01/23 10:36 PM  Result Value Ref Range   Glucose-Capillary 250 (H) 70 - 99 mg/dL  CBG monitoring, ED   Collection Time: 02/01/23 11:41 PM  Result Value Ref Range   Glucose-Capillary 167 (H) 70 - 99 mg/dL  MRSA Next Gen by PCR, Nasal   Collection Time: 02/02/23 12:23 AM   Specimen: Nasal Mucosa; Nasal Swab  Result Value Ref Range   MRSA by PCR Next Gen NOT DETECTED NOT DETECTED  Glucose, capillary   Collection Time: 02/02/23 12:44 AM  Result Value Ref Range   Glucose-Capillary 148 (H) 70 - 99 mg/dL  Glucose, capillary   Collection Time: 02/02/23  1:48 AM  Result Value Ref Range   Glucose-Capillary 166 (H) 70 - 99 mg/dL  Beta-hydroxybutyric acid   Collection Time: 02/02/23  2:47 AM  Result Value Ref Range   Beta-Hydroxybutyric Acid 2.16 (H) 0.05 - 0.27 mmol/L  Hemoglobin A1c   Collection Time: 02/02/23  2:47 AM  Result Value Ref Range   Hgb A1c MFr Bld 11.1 (H) 4.8 - 5.6 %   Mean Plasma Glucose 271.87 mg/dL  HIV Antibody (routine testing w rflx)   Collection Time: 02/02/23  2:47 AM  Result Value Ref Range   HIV Screen 4th Generation wRfx Non Reactive Non Reactive  Basic metabolic panel   Collection Time: 02/02/23  2:47 AM  Result Value Ref Range   Sodium 134 (L) 135 - 145 mmol/L   Potassium 4.3 3.5 - 5.1 mmol/L   Chloride 107 98 - 111 mmol/L   CO2 17 (L) 22 - 32 mmol/L   Glucose, Bld 133 (H) 70 - 99 mg/dL   BUN 12 6 - 20 mg/dL   Creatinine, Ser 4.54 0.61 - 1.24 mg/dL   Calcium 8.7 (L) 8.9 - 10.3 mg/dL   GFR, Estimated >09 >81 mL/min   Anion gap 10 5 - 15   Magnesium   Collection Time: 02/02/23  2:47 AM  Result Value Ref Range   Magnesium 1.8 1.7 - 2.4 mg/dL  Phosphorus   Collection Time: 02/02/23  2:47 AM  Result Value Ref Range  Phosphorus 3.3 2.5 - 4.6 mg/dL  Glucose, capillary   Collection Time: 02/02/23  3:54 AM  Result Value Ref Range   Glucose-Capillary 122 (H) 70 - 99 mg/dL  Glucose, capillary   Collection Time: 02/02/23  4:58 AM  Result Value Ref Range   Glucose-Capillary 125 (H) 70 - 99 mg/dL  Glucose, capillary   Collection Time: 02/02/23  6:04 AM  Result Value Ref Range   Glucose-Capillary 130 (H) 70 - 99 mg/dL  Beta-hydroxybutyric acid   Collection Time: 02/02/23  6:59 AM  Result Value Ref Range   Beta-Hydroxybutyric Acid 1.77 (H) 0.05 - 0.27 mmol/L  Basic metabolic panel   Collection Time: 02/02/23  6:59 AM  Result Value Ref Range   Sodium 136 135 - 145 mmol/L   Potassium 3.8 3.5 - 5.1 mmol/L   Chloride 106 98 - 111 mmol/L   CO2 21 (L) 22 - 32 mmol/L   Glucose, Bld 115 (H) 70 - 99 mg/dL   BUN 10 6 - 20 mg/dL   Creatinine, Ser 4.09 0.61 - 1.24 mg/dL   Calcium 8.8 (L) 8.9 - 10.3 mg/dL   GFR, Estimated >81 >19 mL/min   Anion gap 9 5 - 15  Glucose, capillary   Collection Time: 02/02/23  7:53 AM  Result Value Ref Range   Glucose-Capillary 92 70 - 99 mg/dL   Comment 1 Notify RN    Comment 2 Document in Chart   Glucose, capillary   Collection Time: 02/02/23  9:00 AM  Result Value Ref Range   Glucose-Capillary 133 (H) 70 - 99 mg/dL  Glucose, capillary   Collection Time: 02/02/23  9:50 AM  Result Value Ref Range   Glucose-Capillary 180 (H) 70 - 99 mg/dL  Glucose, capillary   Collection Time: 02/02/23 10:54 AM  Result Value Ref Range   Glucose-Capillary 181 (H) 70 - 99 mg/dL  Basic metabolic panel   Collection Time: 02/02/23 11:20 AM  Result Value Ref Range   Sodium 135 135 - 145 mmol/L   Potassium 4.1 3.5 - 5.1 mmol/L   Chloride 104 98 - 111 mmol/L   CO2 18 (L) 22 - 32 mmol/L   Glucose, Bld 172 (H)  70 - 99 mg/dL   BUN 10 6 - 20 mg/dL   Creatinine, Ser 1.47 0.61 - 1.24 mg/dL   Calcium 8.9 8.9 - 82.9 mg/dL   GFR, Estimated >56 >21 mL/min   Anion gap 13 5 - 15  Glucose, capillary   Collection Time: 02/02/23 12:03 PM  Result Value Ref Range   Glucose-Capillary 165 (H) 70 - 99 mg/dL  Glucose, capillary   Collection Time: 02/02/23 12:39 PM  Result Value Ref Range   Glucose-Capillary 143 (H) 70 - 99 mg/dL  Glucose, capillary   Collection Time: 02/02/23  1:40 PM  Result Value Ref Range   Glucose-Capillary 168 (H) 70 - 99 mg/dL  Glucose, capillary   Collection Time: 02/02/23  2:50 PM  Result Value Ref Range   Glucose-Capillary 282 (H) 70 - 99 mg/dL  Beta-hydroxybutyric acid   Collection Time: 02/02/23  3:07 PM  Result Value Ref Range   Beta-Hydroxybutyric Acid 2.85 (H) 0.05 - 0.27 mmol/L  Glucose, capillary   Collection Time: 02/02/23  4:50 PM  Result Value Ref Range   Glucose-Capillary 238 (H) 70 - 99 mg/dL    Assessment/Plan: Ricky Shaw is a 23 y.o. male with uncontrolled type 1 diabetes using Tandem Tslim insulin pump. His pump shows that he is entering 30-45 grams of  carbs per meal instead of the 60-70 grams he actually consumes; this is leading to post prandial hyperglycemia. His time in target range is 40% which is below goal of >70%.     1-2. DM w/o complication type I, uncontrolled (HCC)/Hyperglycemia - Reviewed insulin pump and CGM download. Discussed trends and patterns.  - Rotate pump sites to prevent scar tissue.  - bolus 15 minutes prior to eating to limit blood sugar spikes.  - Reviewed carb counting and importance of accurate carb counting.  - Discussed signs and symptoms of hypoglycemia. Always have glucose available.  - POCT glucose and hemoglobin A1c  - Reviewed growth chart.   Lab Orders  No laboratory test(s) ordered today     3. Adjustment reaction/noncompliance  -Discussed barriers to care including loss of friend recently  - Encouraged  counseling/behavioral health.   4. Insulin pump Titration /Insulin pump in place.   Basal Rates 12AM 2. 0 --> 2.10  3am 2.10 --> 2.15  6am 2.10 --> 2.20   8am 2.40 --> 2.50   9pm  2.30 --> 2.40    56.85  units per day   Insulin to Carbohydrate Ratio 12AM 8   3am 8   6am 6  8am 5   9pm 5    Insulin Sensitivity Factor 12AM 24  3am 24     6am 25   8am 9pm  20  25     Target Blood Glucose 12AM 110   6am 110  9pm 110            Follow-up:  4 weeks.   LOS: >40  spent today reviewing the medical chart, counseling the patient/family, and documenting today's visit. When a patient is on insulin, intensive monitoring of blood glucose levels is necessary to avoid hyperglycemia and hypoglycemia. Severe hyperglycemia/hypoglycemia can lead to hospital admissions and be life threatening.     Gretchen Short,  FNP-C  Pediatric Specialist  [address]  [place], [address]  Tele: [phone]

## 2023-06-25 ENCOUNTER — Other Ambulatory Visit (HOSPITAL_COMMUNITY): Payer: Self-pay

## 2023-08-01 ENCOUNTER — Encounter (INDEPENDENT_AMBULATORY_CARE_PROVIDER_SITE_OTHER): Payer: Self-pay | Admitting: Family

## 2023-08-01 ENCOUNTER — Ambulatory Visit (INDEPENDENT_AMBULATORY_CARE_PROVIDER_SITE_OTHER): Payer: Medicaid Other | Admitting: Family

## 2023-08-01 ENCOUNTER — Telehealth (INDEPENDENT_AMBULATORY_CARE_PROVIDER_SITE_OTHER): Payer: Self-pay | Admitting: Pharmacy Technician

## 2023-08-01 ENCOUNTER — Other Ambulatory Visit (HOSPITAL_COMMUNITY): Payer: Self-pay

## 2023-08-01 VITALS — BP 132/74 | HR 134 | Wt 160.4 lb

## 2023-08-01 DIAGNOSIS — Z91199 Patient's noncompliance with other medical treatment and regimen due to unspecified reason: Secondary | ICD-10-CM | POA: Diagnosis not present

## 2023-08-01 DIAGNOSIS — Z79899 Other long term (current) drug therapy: Secondary | ICD-10-CM | POA: Diagnosis not present

## 2023-08-01 DIAGNOSIS — Z4681 Encounter for fitting and adjustment of insulin pump: Secondary | ICD-10-CM | POA: Diagnosis not present

## 2023-08-01 DIAGNOSIS — E1065 Type 1 diabetes mellitus with hyperglycemia: Secondary | ICD-10-CM

## 2023-08-01 LAB — POCT GLUCOSE (DEVICE FOR HOME USE): POC Glucose: 321 mg/dL — AB (ref 70–99)

## 2023-08-01 LAB — POCT GLYCOSYLATED HEMOGLOBIN (HGB A1C): Hemoglobin A1C: 11.9 % — AB (ref 4.0–5.6)

## 2023-08-01 MED ORDER — DEXCOM G6 SENSOR MISC
11 refills | Status: DC
Start: 2023-08-01 — End: 2023-09-12

## 2023-08-01 MED ORDER — LANTUS SOLOSTAR 100 UNIT/ML ~~LOC~~ SOPN: PEN_INJECTOR | SUBCUTANEOUS | 5 refills | Status: AC

## 2023-08-01 MED ORDER — INSULIN LISPRO 100 UNIT/ML IJ SOLN: INTRAMUSCULAR | 5 refills | Status: DC

## 2023-08-01 MED ORDER — DEXCOM G6 TRANSMITTER MISC: 6 refills | Status: DC

## 2023-08-01 MED ORDER — NOVOLOG FLEXPEN 100 UNIT/ML ~~LOC~~ SOPN: PEN_INJECTOR | SUBCUTANEOUS | 5 refills | Status: AC

## 2023-08-01 NOTE — Telephone Encounter (Signed)
 Pharmacy Patient Advocate Encounter  Received notification from [facility] that Prior Authorization for Dexcom G6 Sensor has been APPROVED from 08/01/2023 to 01/28/2024. Ran test claim, Copay is $0.00. This test claim was processed through [facility]- copay amounts may vary at other pharmacies due to pharmacy/plan contracts, or as the patient moves through the different stages of their insurance plan.   PA #/Case ID/Reference #: [id number]

## 2023-08-01 NOTE — Telephone Encounter (Signed)
 Pharmacy Patient Advocate Encounter  Insurance verification completed.   The patient is insured through [employer]   Ran test claim for [employer]. The current 90 day co-pay is, $0.00.  No PA needed at this time.  This test claim was processed through [facility]- copay amounts may vary at other pharmacies due to pharmacy/plan contracts, or as the patient moves through the different stages of their insurance plan.

## 2023-08-01 NOTE — Progress Notes (Signed)
 Pediatric Endocrinology Diabetes Consultation Follow-up Visit  Ricky Shaw 07/21/2000 [id number]  Chief Complaint: Follow-up type 1 diabetes   [name], MD   HPI: Ricky Shaw  is a 23 y.o. male presenting for follow-up of type 1 diabetes. he is accompanied to this visit by his grandmother  .  1. Ricky Shaw was admitted to the pediatric ward at [facility] on 11/13/11 for the above chief complaint. He had had polyuria and polydipsia. His initial BG was 340. Initial serum glucose was 354. Serum CO2 was 18. Venous pH was 7.367.  Hemoglobin A1c was 13.3%. Serum C-peptide was 0.68 (normal 0.80-390). Urine glucose was >1000 and urine ketones were >80. Anti-islet cell antibody was 40 (normal <5). Insulin antibodies were 5.0 (normal <0.4).Anti-GAD antibody was negative at <1.0. TSH was 2.344, free T4 1.36, free T3 3.5.  Tissue transglutaminase IgA was 2.2 (normal <20). Gliadin IgA antibodies were 4.9 (normal <20). He was started on Lantus as a basal insulin and Novolog as a bolus insulin at mealtimes, and also at bedtime and 2 AM if needed. After receiving IV fluids, insulins, and DM education, he was discharged on 11/17/11.   2. Since last visit to [facility] on 10/2022, he has been well. He was hospitalized in DKA on 02/01/2023 but did not follow up as instructed. He no showed appointment on 12/20/2022 and 06/17/2023.   He states that he has struggled with life situations lately. He lost his job at [employer] out and his phone was broke, he was unable to get a replacement for a few months.   He stopped using tandem tslim insulin pump for "4 months" due to running out of supplies. He wants to restart Dexcom and Tandem insulin pump as soon as possible. Has not been wearing Dexcom CGM and is rarely checking blood sugars. When using MDI he has been giving 23 units per night, rarely misses a dose. Estimates 15-16 units of Novolog per meal, occasionally forgets to take Novolog when he eats. Hypoglycemia is rare, none  severe or requiring glucagon.    Insulin regimen: Tsim insulin pump  Basal Rates 12AM 2.10  3am 2.15  6am 2.20   8am 2.50   9pm  2.40    56.85  units per day   Insulin to Carbohydrate Ratio 12AM 8   3am 8   6am 6  8am 5   9pm 5    Insulin Sensitivity Factor 12AM 24  3am 24     6am 25   8am 9pm  20  25     Target Blood Glucose 12AM 110   6am 110  9pm 110            Hypoglycemia: Is consistently able to feel low blood sugars.practice. No glucagon needed.  Insulin pump download: Not wearing currently.  Med-alert ID: Bracelet  Injection sites: legs, abdomen  Annual labs due: Next visit  Ophthalmology due: 2023. Advised he needs to go annually.     3. ROS: Greater than 10 systems reviewed with pertinent positives listed in HPI, otherwise neg. Constitutional: Sleeping well. 12 lbs weight loss.  Eyes: No changes in vision. No blurry vision.  Ears/Nose/Mouth/Throat: No difficulty swallowing. Denies neck pain  Cardiovascular: No palpitations. Denies chest pain  Respiratory: No increased work of breathing. Denies SOB   Neurologic: Normal sensation, no tremor GI: No abdominal pain. No constipation/diarrhea.  Endocrine: No polydipsia.  No hyperpigmentation Psychiatric: Normal affect today. Denies depression and anxiety   Past Medical History:   Past Medical History:  Diagnosis Date   Diabetes mellitus 11/14/2011   Diabetes mellitus without complication (HCC)    Phreesia 03/03/2020    Medications:  Outpatient Encounter Medications as of 08/01/2023  Medication Sig Note   insulin aspart (NOVOLOG FLEXPEN) 100 UNIT/ML FlexPen Inject up to 50 units subcutaneously daily if pump fails.    insulin glargine (LANTUS SOLOSTAR) 100 UNIT/ML Solostar Pen Inject up to 50 units per day when not using insulin pump.    lisinopril (ZESTRIL) 5 MG tablet Take 1 tablet (5 mg total) by mouth daily.    [DISCONTINUED] insulin lispro (HUMALOG) 100 UNIT/ML injection USE 300 UNITS  VIA INSULIN PUMP EVERY 48 HOURS    Continuous Glucose Sensor (DEXCOM G6 SENSOR) MISC Change dexcom sensor every 10 days.    Continuous Glucose Transmitter (DEXCOM G6 TRANSMITTER) MISC Use as directed    insulin lispro (HUMALOG) 100 UNIT/ML injection USE 300 UNITS VIA INSULIN PUMP EVERY 48 HOURS    Insulin Pen Needle 32G X 4 MM MISC 1 applicator by Does not apply route 3 (three) times daily. Inject up to 5 x per day. (Patient not taking: Reported on 09/20/2022)    [DISCONTINUED] Continuous Glucose Sensor (DEXCOM G6 SENSOR) MISC USE AS DIRECTED (Patient not taking: Reported on 08/01/2023)    [DISCONTINUED] Continuous Glucose Transmitter (DEXCOM G6 TRANSMITTER) MISC Use as directed (Patient not taking: Reported on 08/01/2023)    [DISCONTINUED] insulin lispro (HUMALOG) 100 UNIT/ML KwikPen Inject 10 Units into the skin 3 (three) times daily. ONLY use in case of pump failure.  Refer to instructions for how to take and what amount. (Patient not taking: Reported on 09/20/2022) 02/01/2023: Pt stated he needs this, but can't get it due to insurance. For if pump fails as backup.   No facility-administered encounter medications on file as of 08/01/2023.    Allergies: Allergies  Allergen Reactions   Amoxicillin Hives    Surgical History: Past Surgical History:  Procedure Laterality Date   nasal cauterization     NASAL HEMORRHAGE CONTROL  05/14/2006    Family History:  Family History  Problem Relation Age of Onset   Asthma Maternal Aunt    Cancer Maternal Grandfather    Diabetes Paternal Grandmother    Hypertension Other    Thyroid disease Neg Hx       Social History: Lives with: mother  Graduated. Unemployed currently   Physical Exam:  Vitals:   08/01/23 0951 08/01/23 1011  BP: (!) 140/100 132/74  Pulse: (!) 134   Weight: 160 lb 6.4 oz (72.8 kg)       BP 132/74   Pulse (!) 134   Wt 160 lb 6.4 oz (72.8 kg)   BMI 25.12 kg/m  Body mass index: body mass index is 25.12 kg/m. Growth  %ile SmartLinks can only be used for patients less than 10 years old.  Ht Readings from Last 3 Encounters:  02/02/23 5\' 7"  (1.702 m)  08/28/22 5\' 6"  (1.676 m)  12/08/21 5\' 7"  (1.702 m) (18%, Z= -0.92)*   * Growth percentiles are based on CDC (Boys, 2-20 Years) data.   Wt Readings from Last 3 Encounters:  08/01/23 160 lb 6.4 oz (72.8 kg)  02/02/23 154 lb 5.2 oz (70 kg)  11/06/22 172 lb 3.2 oz (78.1 kg)    PHYSICAL EXAM: General: Well developed, well nourished male in no acute distress.   Head: Normocephalic, atraumatic.   Eyes:  Pupils equal and round. EOMI.  Sclera white.  No eye drainage.   Ears/Nose/Mouth/Throat: Nares patent,  no nasal drainage.  Normal dentition, mucous membranes moist.  Neck: supple, no cervical lymphadenopathy, no thyromegaly Cardiovascular: regular rate, normal S1/S2, no murmurs Respiratory: No increased work of breathing.  Lungs clear to auscultation bilaterally.  No wheezes. Abdomen: soft, nontender, nondistended. Normal bowel sounds.  No appreciable masses  Extremities: warm, well perfused, cap refill < 2 sec.   Musculoskeletal: Normal muscle mass.  Normal strength Skin: warm, dry.  No rash or lesions. Neurologic: alert and oriented, normal speech, no tremor    Labs:  Results for orders placed or performed in visit on 08/01/23  POCT Glucose (Device for Home Use)   Collection Time: 08/01/23 10:03 AM  Result Value Ref Range   Glucose Fasting, POC     POC Glucose 321 (A) 70 - 99 mg/dl  POCT glycosylated hemoglobin (Hb A1C)   Collection Time: 08/01/23 10:07 AM  Result Value Ref Range   Hemoglobin A1C 11.9 (A) 4.0 - 5.6 %   HbA1c POC (<> result, manual entry)     HbA1c, POC (prediabetic range)     HbA1c, POC (controlled diabetic range)      Assessment/Plan: Ricky Shaw is a 23 y.o. male with uncontrolled type 1 diabetes. He will restart insulin pump and CGM therapy toda. He has struggled with noncompliance while using MDI. Hemoglobin A1c is 11.9%  which is higher then ADA goal of <7%.    1-2. DM w/o complication type I, uncontrolled (HCC)/Hyperglycemia - Unable to review meter and CGM today, he is not using/did not bring meter.  - Rotate pump sites to prevent scar tissue.  - bolus 15 minutes prior to eating to limit blood sugar spikes.  - Reviewed carb counting and importance of accurate carb counting.  - Discussed signs and symptoms of hypoglycemia. Always have glucose available.  - POCT glucose and hemoglobin A1c  - Reviewed growth chart.  - Discussed restarting insulin pump therapy.  - Gave sample of Dexcom G6 sensor.  - Refer to adult endocrine after his next visit.  Lab Orders         POCT glycosylated hemoglobin (Hb A1C)         POCT Glucose (Device for Home Use)      3. Noncompliance  - Extensively discussed barriers to care.  - Discussed potential complications of uncontrolled T1DM. Stressed importance of good glucose control to prevent complications.   4. Insulin pump Titration /High risk med  - Restart Tandem insulin pump.     Follow-up:  6 weeks. Stressed importance of keeping follow up visits.   LOS:>34 minutes  spent today reviewing the medical chart, counseling the patient/family, and documenting today's visit. This time does not include CGM interpretation.  When a patient is on insulin, intensive monitoring of blood glucose levels is necessary to avoid hyperglycemia and hypoglycemia. Severe hyperglycemia/hypoglycemia can lead to hospital admissions and be life threatening.   Gretchen Short, DNP, FNP-C  Pediatric Specialist  [address]  [place], [address]  Tele: [phone]

## 2023-08-01 NOTE — Patient Instructions (Signed)
 It was a pleasure seeing you in clinic today. Please do not hesitate to contact me if you have questions or concerns.   Please sign up for MyChart. This is a communication tool that allows you to send an email directly to me. This can be used for questions, prescriptions and blood sugar reports. We will also release labs to you with instructions on MyChart. Please do not use MyChart if you need immediate or emergency assistance. Ask our wonderful front office staff if you need assistance.   Restart tandem insulin pump  and dexcom CGM.

## 2023-08-01 NOTE — Telephone Encounter (Signed)
 Pharmacy Patient Advocate Encounter   Received notification from CoverMyMeds that prior authorization for Dexcom G6 Sensor is required/requested.   Insurance verification completed.   The patient is insured through [facility] .   Per test claim: PA required; PA submitted to above mentioned insurance via CoverMyMeds Key/confirmation #/EOC [id number] Status is pending

## 2023-08-20 ENCOUNTER — Encounter (INDEPENDENT_AMBULATORY_CARE_PROVIDER_SITE_OTHER): Payer: Self-pay

## 2023-08-28 ENCOUNTER — Other Ambulatory Visit (INDEPENDENT_AMBULATORY_CARE_PROVIDER_SITE_OTHER): Payer: Self-pay | Admitting: Family

## 2023-09-02 ENCOUNTER — Encounter (INDEPENDENT_AMBULATORY_CARE_PROVIDER_SITE_OTHER): Payer: Self-pay

## 2023-09-12 ENCOUNTER — Ambulatory Visit (INDEPENDENT_AMBULATORY_CARE_PROVIDER_SITE_OTHER): Admitting: Family

## 2023-09-12 ENCOUNTER — Encounter (INDEPENDENT_AMBULATORY_CARE_PROVIDER_SITE_OTHER): Payer: Self-pay | Admitting: Family

## 2023-09-12 VITALS — BP 126/80 | HR 94 | Wt 164.8 lb

## 2023-09-12 DIAGNOSIS — Z9641 Presence of insulin pump (external) (internal): Secondary | ICD-10-CM | POA: Diagnosis not present

## 2023-09-12 DIAGNOSIS — E1065 Type 1 diabetes mellitus with hyperglycemia: Secondary | ICD-10-CM

## 2023-09-12 DIAGNOSIS — Z91199 Patient's noncompliance with other medical treatment and regimen due to unspecified reason: Secondary | ICD-10-CM | POA: Diagnosis not present

## 2023-09-12 DIAGNOSIS — R809 Proteinuria, unspecified: Secondary | ICD-10-CM

## 2023-09-12 MED ORDER — LISINOPRIL 5 MG PO TABS: 5.0000 mg | ORAL_TABLET | Freq: Every day | ORAL | 3 refills | Status: AC

## 2023-09-12 MED ORDER — DEXCOM G6 TRANSMITTER MISC: 6 refills | Status: AC

## 2023-09-12 MED ORDER — DEXCOM G6 SENSOR MISC: 11 refills | Status: DC

## 2023-09-12 MED ORDER — INSULIN LISPRO 100 UNIT/ML IJ SOLN
INTRAMUSCULAR | 5 refills | Status: AC
Start: 2023-09-12 — End: ?

## 2023-09-12 NOTE — Patient Instructions (Signed)
 It was a pleasure seeing you in clinic today. Please do not hesitate to contact me if you have questions or concerns.   Please sign up for MyChart. This is a communication tool that allows you to send an email directly to me. This can be used for questions, prescriptions and blood sugar reports. We will also release labs to you with instructions on MyChart. Please do not use MyChart if you need immediate or emergency assistance. Ask our wonderful front office staff if you need assistance.   - Please let me know where you would like referral place for adult endocrinology  - [facility], [facility], [place]   - Take 5 mg of lisinopril  daily   - Continue tandem tslim and Dexcom CGM.

## 2023-09-12 NOTE — Progress Notes (Signed)
 Pediatric Endocrinology Diabetes Consultation Follow-up Visit  Ricky Shaw 07-25-2000 [id number]  Chief Complaint: Follow-up type 1 diabetes   Camilla Cedar, MD   HPI: Ricky Shaw  is a 23 y.o. male presenting for follow-up of type 1 diabetes. he is accompanied to this visit by his grandmother  .  1. Ricky Shaw was admitted to the pediatric ward at [facility] on 11/13/11 for the above chief complaint. He had had polyuria and polydipsia. His initial BG was 340. Initial serum glucose was 354. Serum CO2 was 18. Venous pH was 7.367.  Hemoglobin A1c was 13.3%. Serum C-peptide was 0.68 (normal 0.80-390). Urine glucose was >1000 and urine ketones were >80. Anti-islet cell antibody was 40 (normal <5). Insulin  antibodies were 5.0 (normal <0.4).Anti-GAD antibody was negative at <1.0. TSH was 2.344, free T4 1.36, free T3 3.5.  Tissue transglutaminase IgA was 2.2 (normal <20). Gliadin IgA antibodies were 4.9 (normal <20). He was started on Lantus  as a basal insulin  and Novolog  as a bolus insulin  at mealtimes, and also at bedtime and 2 AM if needed. After receiving IV fluids, insulins, and DM education, he was discharged on 11/17/11.   2. Since last visit to [facility] on 07/2023, since that time he has been well.   He is staying active going for walks and doing chores. In the process of finding a new job. Reports healthy diet.   Reports he is doing a little bit better with his diabetes care since last visit. Using Tandem TSlim X2 insulin  and Dexcom G6, both are working well. He has had 1-2 failed pump sites but is changing them early when he realized they are not working. He reports bolusing after eating at most meals, occasionally forgets to bolus. At meals he eats 50-65 grams per meal. Lows occur intermittently, none severe or requiring glucagon .   He is prescribed 5 mg of lisinopril  daily but has not been taking consistently.   Insulin  regimen: Tsim insulin  pump  Basal Rates 12AM 2.10  3am 2.15  6am 2.20    8am 2.50   9pm  2.40    56.85  units per day   Insulin  to Carbohydrate Ratio 12AM 8   3am 8   6am 6  8am 5   9pm 5    Insulin  Sensitivity Factor 12AM 24  3am 24     6am 25   8am 9pm  20  25     Target Blood Glucose 12AM 110   6am 110  9pm 110            Hypoglycemia: Is consistently able to feel low blood sugars.practice. No glucagon  needed.  Insulin  pump download:   Med-alert ID: Bracelet  Injection sites: legs, abdomen  Annual labs due: 10/2023 Ophthalmology due: 2023. Discussed importance of annual eye exam extensively.     3. ROS: Greater than 10 systems reviewed with pertinent positives listed in HPI, otherwise neg. Constitutional:Sleeping well HEENT: No vision changes. No difficulty swallowing.  Respiratory: No increased work of breathing currently GI: No constipation or diarrhea Musculoskeletal: No joint deformity Neuro: Normal affect. No tremors.  Endocrine: As above   Past Medical History:   Past Medical History:  Diagnosis Date   Diabetes mellitus 11/14/2011   Diabetes mellitus without complication (HCC)    Phreesia 03/03/2020    Medications:  Outpatient Encounter Medications as of 09/12/2023  Medication Sig   Continuous Glucose Sensor (DEXCOM G6 SENSOR) MISC Change dexcom sensor every 10 days.   Continuous Glucose Transmitter (DEXCOM G6 TRANSMITTER)  MISC Use as directed   insulin  lispro (HUMALOG ) 100 UNIT/ML injection USE 300 UNITS VIA INSULIN  PUMP EVERY 48 HOURS   Insulin  Pen Needle 32G X 4 MM MISC 1 applicator by Does not apply route 3 (three) times daily. Inject up to 5 x per day.   lisinopril  (ZESTRIL ) 5 MG tablet Take 1 tablet (5 mg total) by mouth daily.   insulin  aspart (NOVOLOG  FLEXPEN) 100 UNIT/ML FlexPen Inject up to 50 units subcutaneously daily if pump fails. (Patient not taking: Reported on 09/12/2023)   insulin  glargine (LANTUS  SOLOSTAR) 100 UNIT/ML Solostar Pen Inject up to 50 units per day when not using insulin  pump.  (Patient not taking: Reported on 09/12/2023)   No facility-administered encounter medications on file as of 09/12/2023.    Allergies: Allergies  Allergen Reactions   Amoxicillin Hives    Surgical History: Past Surgical History:  Procedure Laterality Date   nasal cauterization     NASAL HEMORRHAGE CONTROL  05/14/2006    Family History:  Family History  Problem Relation Age of Onset   Asthma Maternal Aunt    Cancer Maternal Grandfather    Diabetes Paternal Grandmother    Hypertension Other    Thyroid disease Neg Hx       Social History: Lives with: mother  Graduated. Unemployed currently   Physical Exam:  Vitals:   09/12/23 0818  BP: 126/80  Pulse: 94  Weight: 164 lb 12.8 oz (74.8 kg)      BP 126/80 (BP Location: Left Wrist, Patient Position: Sitting, Cuff Size: Normal)   Pulse 94   Wt 164 lb 12.8 oz (74.8 kg)   BMI 25.81 kg/m  Body mass index: body mass index is 25.81 kg/m. Growth %ile SmartLinks can only be used for patients less than 36 years old.  Ht Readings from Last 3 Encounters:  02/02/23 5\' 7"  (1.702 m)  08/28/22 5\' 6"  (1.676 m)  12/08/21 5\' 7"  (1.702 m) (18%, Z= -0.92)*   * Growth percentiles are based on CDC (Boys, 2-20 Years) data.   Wt Readings from Last 3 Encounters:  09/12/23 164 lb 12.8 oz (74.8 kg)  08/01/23 160 lb 6.4 oz (72.8 kg)  02/02/23 154 lb 5.2 oz (70 kg)    PHYSICAL EXAM: General: Well developed, well nourished male in no acute distress.   Head: Normocephalic, atraumatic.   Eyes:  Pupils equal and round. EOMI.  Sclera white.  No eye drainage.   Ears/Nose/Mouth/Throat: Nares patent, no nasal drainage.  Normal dentition, mucous membranes moist.  Neck: supple, no cervical lymphadenopathy, no thyromegaly Cardiovascular: regular rate, normal S1/S2, no murmurs Respiratory: No increased work of breathing.  Lungs clear to auscultation bilaterally.  No wheezes. Abdomen: soft, nontender, nondistended. Normal bowel sounds.  No  appreciable masses  Extremities: warm, well perfused, cap refill < 2 sec.   Musculoskeletal: Normal muscle mass.  Normal strength Skin: warm, dry.  No rash or lesions. Neurologic: alert and oriented, normal speech, no tremor  Labs:  Results for orders placed or performed in visit on 08/01/23  POCT Glucose (Device for Home Use)   Collection Time: 08/01/23 10:03 AM  Result Value Ref Range   Glucose Fasting, POC     POC Glucose 321 (A) 70 - 99 mg/dl  POCT glycosylated hemoglobin (Hb A1C)   Collection Time: 08/01/23 10:07 AM  Result Value Ref Range   Hemoglobin A1C 11.9 (A) 4.0 - 5.6 %   HbA1c POC (<> result, manual entry)     HbA1c, POC (prediabetic  range)     HbA1c, POC (controlled diabetic range)      Assessment/Plan: Ricky Shaw is a 23 y.o. male with uncontrolled type 1 diabetes. Ricky Shaw has made improvements with diabetes care and is wearing pump/CGM more consistently. Time in target range has increased to 51%, goal is >70%.    1-2. DM w/o complication type I, uncontrolled (HCC)/Hyperglycemia 2. Microalbuminuria  - Reviewed insulin  pump and CGM download. Discussed trends and patterns.  - Rotate pump sites to prevent scar tissue.  - bolus 15 minutes prior to eating to limit blood sugar spikes.  - Reviewed carb counting and importance of accurate carb counting.  - Discussed signs and symptoms of hypoglycemia. Always have glucose available.  - Reviewed growth chart.  - Discussed monitoring for failed pump sites and protocol when site failure occurs.  - Refer to adult endocrinology. Discussed transition of care. He will contact to let me know if he prefer [facility], [facility] or [facility] after speaking with his mother.  - 5 mg of lisinopril  daily  Lab Orders         Microalbumin / creatinine urine ratio      3. Noncompliance  Praise given for improvements.  - Discussed importance of good diabetes control to prevent diabetes related complications.   4. Insulin  pump Titration /High  risk med  - Pump in place.     Follow-up:  Adult endocrine.   When a patient is on insulin , intensive monitoring of blood glucose levels is necessary to avoid hyperglycemia and hypoglycemia. Severe hyperglycemia/hypoglycemia can lead to hospital admissions and be life threatening.   Candee Cha, DNP, FNP-C  Pediatric Specialist  [address]  [place], [address]  Tele: [phone]

## 2023-10-08 ENCOUNTER — Telehealth (INDEPENDENT_AMBULATORY_CARE_PROVIDER_SITE_OTHER): Payer: Self-pay | Admitting: Family

## 2023-10-08 DIAGNOSIS — E1065 Type 1 diabetes mellitus with hyperglycemia: Secondary | ICD-10-CM

## 2023-10-08 NOTE — Telephone Encounter (Signed)
 Ricky Shaw is calling to see if he could have a ENDO referral sent to an Adult Endo.

## 2023-10-15 NOTE — Telephone Encounter (Addendum)
 Called [facility] to see where he would like a referral sent to for adult endo. Lillard stated he wants to stay in the same building and go to [facility].  Referral Placed

## 2023-10-15 NOTE — Addendum Note (Signed)
 Addended by: Eris Hannan on: 10/15/2023 02:08 PM   Modules accepted: Orders

## 2023-11-26 ENCOUNTER — Emergency Department (HOSPITAL_COMMUNITY)

## 2023-11-26 ENCOUNTER — Other Ambulatory Visit: Payer: Self-pay

## 2023-11-26 ENCOUNTER — Encounter (HOSPITAL_COMMUNITY): Payer: Self-pay | Admitting: Emergency Medicine

## 2023-11-26 ENCOUNTER — Observation Stay (HOSPITAL_COMMUNITY)
Admission: EM | Admit: 2023-11-26 | Discharge: 2023-11-27 | Disposition: A | Discharge status: Home / Self Care | Attending: Internal Medicine | Admitting: Internal Medicine

## 2023-11-26 DIAGNOSIS — E1010 Type 1 diabetes mellitus with ketoacidosis without coma: Principal | ICD-10-CM | POA: Insufficient documentation

## 2023-11-26 DIAGNOSIS — E1110 Type 2 diabetes mellitus with ketoacidosis without coma: Secondary | ICD-10-CM | POA: Diagnosis present

## 2023-11-26 DIAGNOSIS — Z794 Long term (current) use of insulin: Secondary | ICD-10-CM | POA: Insufficient documentation

## 2023-11-26 DIAGNOSIS — R110 Nausea: Secondary | ICD-10-CM | POA: Diagnosis not present

## 2023-11-26 DIAGNOSIS — E1065 Type 1 diabetes mellitus with hyperglycemia: Secondary | ICD-10-CM | POA: Diagnosis present

## 2023-11-26 DIAGNOSIS — I10 Essential (primary) hypertension: Secondary | ICD-10-CM | POA: Insufficient documentation

## 2023-11-26 DIAGNOSIS — F12920 Cannabis use, unspecified with intoxication, uncomplicated: Secondary | ICD-10-CM | POA: Insufficient documentation

## 2023-11-26 DIAGNOSIS — R739 Hyperglycemia, unspecified: Secondary | ICD-10-CM | POA: Diagnosis present

## 2023-11-26 LAB — COMPREHENSIVE METABOLIC PANEL WITH GFR
ALT: 13 U/L (ref 0–44)
AST: 14 U/L — ABNORMAL LOW (ref 15–41)
Albumin: 4.6 g/dL (ref 3.5–5.0)
Alkaline Phosphatase: 75 U/L (ref 38–126)
Anion gap: 19 — ABNORMAL HIGH (ref 5–15)
BUN: 19 mg/dL (ref 6–20)
CO2: 11 mmol/L — ABNORMAL LOW (ref 22–32)
Calcium: 9.6 mg/dL (ref 8.9–10.3)
Chloride: 100 mmol/L (ref 98–111)
Creatinine, Ser: 1.2 mg/dL (ref 0.61–1.24)
GFR, Estimated: >60 mL/min (ref >60)
Glucose, Bld: 363 mg/dL — ABNORMAL HIGH (ref 70–99)
Potassium: 4.9 mmol/L (ref 3.5–5.1)
Sodium: 130 mmol/L — ABNORMAL LOW (ref 135–145)
Total Bilirubin: 1.9 mg/dL — ABNORMAL HIGH (ref 0.0–1.2)
Total Protein: 8.4 g/dL — ABNORMAL HIGH (ref 6.5–8.1)

## 2023-11-26 LAB — URINALYSIS, ROUTINE W REFLEX MICROSCOPIC
Bacteria, UA: NONE SEEN
Bilirubin Urine: NEGATIVE
Glucose, UA: >=500 mg/dL — AB
Ketones, ur: 80 mg/dL — AB
Leukocytes,Ua: NEGATIVE
Nitrite: NEGATIVE
Protein, ur: 100 mg/dL — AB
Specific Gravity, Urine: 1.024 (ref 1.005–1.030)
pH: 5 (ref 5.0–8.0)

## 2023-11-26 LAB — CBG MONITORING, ED
Glucose-Capillary: 353 mg/dL — ABNORMAL HIGH (ref 70–99)
Glucose-Capillary: 408 mg/dL — ABNORMAL HIGH (ref 70–99)
Glucose-Capillary: 396 mg/dL — ABNORMAL HIGH (ref 70–99)

## 2023-11-26 LAB — CBC
HCT: 48.8 % (ref 39.0–52.0)
Hemoglobin: 16 g/dL (ref 13.0–17.0)
MCH: 28.7 pg (ref 26.0–34.0)
MCHC: 32.8 g/dL (ref 30.0–36.0)
MCV: 87.6 fL (ref 80.0–100.0)
Platelets: 298 K/uL (ref 150–400)
RBC: 5.57 MIL/uL (ref 4.22–5.81)
RDW: 13.2 % (ref 11.5–15.5)
WBC: 10.7 K/uL — ABNORMAL HIGH (ref 4.0–10.5)
nRBC: 0 % (ref 0.0–0.2)

## 2023-11-26 LAB — LIPASE, BLOOD: Lipase: 19 U/L (ref 11–51)

## 2023-11-26 LAB — GLUCOSE, CAPILLARY: Glucose-Capillary: 253 mg/dL — ABNORMAL HIGH (ref 70–99)

## 2023-11-26 LAB — BETA-HYDROXYBUTYRIC ACID: Beta-Hydroxybutyric Acid: >8 mmol/L — ABNORMAL HIGH (ref 0.05–0.27)

## 2023-11-26 MED ORDER — LACTATED RINGERS IV SOLN: INTRAVENOUS | Status: DC

## 2023-11-26 MED ORDER — ENOXAPARIN SODIUM 40 MG/0.4ML IJ SOSY
40.0000 mg | PREFILLED_SYRINGE | INTRAMUSCULAR | Status: DC
  Administered 2023-11-27: 40 mg via SUBCUTANEOUS
  Filled 2023-11-26: qty 0.4

## 2023-11-26 MED ORDER — ACETAMINOPHEN 325 MG PO TABS: 650.0000 mg | ORAL_TABLET | Freq: Four times a day (QID) | ORAL | Status: DC | PRN

## 2023-11-26 MED ORDER — LACTATED RINGERS IV BOLUS
20.0000 mL/kg | Freq: Once | INTRAVENOUS | Status: AC
  Administered 2023-11-26: 1496 mL via INTRAVENOUS

## 2023-11-26 MED ORDER — ONDANSETRON HCL 4 MG/2ML IJ SOLN
4.0000 mg | Freq: Once | INTRAMUSCULAR | Status: AC
  Administered 2023-11-26: 4 mg via INTRAVENOUS
  Filled 2023-11-26: qty 2

## 2023-11-26 MED ORDER — LISINOPRIL 2.5 MG PO TABS
5.0000 mg | ORAL_TABLET | Freq: Every day | ORAL | Status: DC
  Administered 2023-11-27: 5 mg via ORAL
  Filled 2023-11-26: qty 2

## 2023-11-26 MED ORDER — POTASSIUM CHLORIDE 10 MEQ/100ML IV SOLN
10.0000 meq | INTRAVENOUS | Status: AC
  Administered 2023-11-26: 10 meq via INTRAVENOUS
  Filled 2023-11-26: qty 100

## 2023-11-26 MED ORDER — DEXTROSE IN LACTATED RINGERS 5 % IV SOLN: INTRAVENOUS | Status: DC

## 2023-11-26 MED ORDER — KETOROLAC TROMETHAMINE 15 MG/ML IJ SOLN
15.0000 mg | Freq: Once | INTRAMUSCULAR | Status: AC
  Administered 2023-11-26: 15 mg via INTRAVENOUS
  Filled 2023-11-26: qty 1

## 2023-11-26 MED ORDER — ONDANSETRON HCL 4 MG/2ML IJ SOLN: 4.0000 mg | Freq: Four times a day (QID) | INTRAMUSCULAR | Status: DC | PRN

## 2023-11-26 MED ORDER — SENNOSIDES-DOCUSATE SODIUM 8.6-50 MG PO TABS: 1.0000 | ORAL_TABLET | Freq: Every evening | ORAL | Status: DC | PRN

## 2023-11-26 MED ORDER — DEXTROSE 50 % IV SOLN: 0.0000 mL | INTRAVENOUS | Status: DC | PRN

## 2023-11-26 MED ORDER — ACETAMINOPHEN 650 MG RE SUPP: 650.0000 mg | Freq: Four times a day (QID) | RECTAL | Status: DC | PRN

## 2023-11-26 MED ORDER — CHLORHEXIDINE GLUCONATE CLOTH 2 % EX PADS
6.0000 | MEDICATED_PAD | Freq: Every day | CUTANEOUS | Status: DC
  Administered 2023-11-27: 6 via TOPICAL

## 2023-11-26 MED ORDER — ONDANSETRON HCL 4 MG PO TABS: 4.0000 mg | ORAL_TABLET | Freq: Four times a day (QID) | ORAL | Status: DC | PRN

## 2023-11-26 MED ORDER — INSULIN REGULAR(HUMAN) IN NACL 100-0.9 UT/100ML-% IV SOLN
INTRAVENOUS | Status: DC
  Administered 2023-11-26: 12 [IU]/h via INTRAVENOUS
  Administered 2023-11-26: 11 [IU]/h via INTRAVENOUS
  Filled 2023-11-26: qty 100

## 2023-11-26 MED ORDER — ORAL CARE MOUTH RINSE
15.0000 mL | OROMUCOSAL | Status: DC | PRN
Start: 2023-11-26 — End: 2023-11-27

## 2023-11-26 NOTE — H&P (Signed)
 History and Physical  Ricky Shaw FMW:[id number] DOB: July 09, 2000 DOA: 11/26/2023  PCP: [name], MD   Chief Complaint: Hyperglycemia, nausea and headache  HPI: Ricky Shaw is a 23 y.o. male with medical history significant for type 1 diabetes mellitus on insulin  pump, DKA and HTN who presented to the ED for evaluation of hyperglycemia, nausea and headache.  Patient reports he had a late dinner last night. His blood sugars were within range this morning however around 4 PM today, he started having headache and chest discomfort described as pressure. His blood sugar was elevated to the 300s. His symptoms did not improved he started having abdominal pain and nausea so he presented to the ED for evaluation. Reports his insulin  pump is working fine and he has not had any dietary indiscretion. He had 1 episode of emesis while in the ER. His headache, chest pain and abdominal pain have improved and he denies any shortness of breath, fever, chills, dysuria, polyuria, vision changes or numbness. His last DKA was in September 2024.  ED Course: Initial vitals overall stable. Initial labs significant for sodium 130, K+ 4.9, bicarb 11, glucose 363, creatinine 1.20, anion gap 19, bilirubin 1.9, WBC 10.7, BHB >8.0, UA shows significant glucosuria, ketonuria and proteinuria. EKG shows sinus rhythm with nonspecific T wave changes. CXR shows no acute cardiopulmonary disease. Pt received IV Zofran  4 mg x 1, IV Toradol  15 mg x 1 IV LR and started on insulin  drip. TR[facility] was consulted for admission.   Review of Systems: Please see HPI for pertinent positives and negatives. A complete 10 system review of systems are otherwise negative.  Past Medical History:  Diagnosis Date   Diabetes mellitus 11/14/2011   Diabetes mellitus without complication (HCC)    Phreesia 03/03/2020   Past Surgical History:  Procedure Laterality Date   nasal cauterization     NASAL HEMORRHAGE CONTROL  05/14/2006   Social  History:  reports that he has never smoked. He has never used smokeless tobacco. He reports current drug use. Drug: Marijuana. He reports that he does not drink alcohol.  Allergies  Allergen Reactions   Amoxicillin Hives    Family History  Problem Relation Age of Onset   Asthma Maternal Aunt    Cancer Maternal Grandfather    Diabetes Paternal Grandmother    Hypertension Other    Thyroid disease Neg Hx      Prior to Admission medications   Medication Sig Start Date End Date Taking? Authorizing Provider  Continuous Glucose Sensor (DEXCOM G6 SENSOR) MISC Change dexcom sensor every 10 days. 09/12/23   Verdon Darnel, NP  Continuous Glucose Transmitter (DEXCOM G6 TRANSMITTER) MISC Use as directed 09/12/23   Verdon Darnel, NP  insulin  aspart (NOVOLOG  FLEXPEN) 100 UNIT/ML FlexPen Inject up to 50 units subcutaneously daily if pump fails. Patient not taking: Reported on 09/12/2023 08/01/23   Verdon Darnel, NP  insulin  glargine (LANTUS  SOLOSTAR) 100 UNIT/ML Solostar Pen Inject up to 50 units per day when not using insulin  pump. Patient not taking: Reported on 09/12/2023 08/01/23   Verdon Darnel, NP  insulin  lispro (HUMALOG ) 100 UNIT/ML injection USE 300 UNITS VIA INSULIN  PUMP EVERY 48 HOURS 09/12/23   Verdon Darnel, NP  Insulin  Pen Needle 32G X 4 MM MISC 1 applicator by Does not apply route 3 (three) times daily. Inject up to 5 x per day. 12/12/21   Verdon Darnel, NP  lisinopril  (ZESTRIL ) 5 MG tablet Take 1 tablet (5 mg total) by mouth daily. 09/12/23  Verdon Darnel, NP    Physical Exam: BP 130/71 (BP Location: Left Arm)   Pulse (!) 113   Temp 98.3 F (36.8 C) (Oral)   Resp 17   Ht 5' 7 (1.702 m)   Wt 70.5 kg   SpO2 99%   BMI 24.34 kg/m  General: Pleasant, well-appearing young man laying in bed. No acute distress. HEENT: [place]/AT. Anicteric sclera. Dry mucous membrane. CV: RRR. No murmurs, rubs, or gallops. No LE edema Pulmonary: Lungs CTAB. Normal effort. No wheezing or  rales. Abdominal: Soft, nontender, nondistended. Normal bowel sounds. Extremities: Palpable radial and DP pulses. Normal ROM. Skin: Warm and dry. No obvious rash or lesions. Neuro: A&Ox3. Moves all extremities. Normal sensation to light touch. No focal deficit. Psych: Normal mood and affect          Labs on Admission:  Basic Metabolic Panel: Recent Labs  Lab 11/26/23 1953  NA 130*  K 4.9  CL 100  CO2 11*  GLUCOSE 363*  BUN 19  CREATININE 1.20  CALCIUM 9.6   Liver Function Tests: Recent Labs  Lab 11/26/23 1953  AST 14*  ALT 13  ALKPHOS 75  BILITOT 1.9*  PROT 8.4*  ALBUMIN 4.6   Recent Labs  Lab 11/26/23 1953  LIPASE 19   No results for input(s): AMMONIA in the last 168 hours. CBC: Recent Labs  Lab 11/26/23 1953  WBC 10.7*  HGB 16.0  HCT 48.8  MCV 87.6  PLT 298   Cardiac Enzymes: No results for input(s): CKTOTAL, CKMB, CKMBINDEX, TROPONINI in the last 168 hours. BNP (last 3 results) No results for input(s): BNP in the last 8760 hours.  ProBNP (last 3 results) No results for input(s): PROBNP in the last 8760 hours.  CBG: Recent Labs  Lab 11/26/23 1939 11/26/23 2128 11/26/23 2207 11/26/23 2321  GLUCAP 353* 408* 396* 253*    Radiological Exams on Admission: DG Chest Portable 1 View Result Date: 11/26/2023 CLINICAL DATA:  chest pain, vomiting EXAM: PORTABLE CHEST 1 VIEW COMPARISON:  Chest x-ray 03/21/2021 FINDINGS: The heart and mediastinal contours are within normal limits. No focal consolidation. No pulmonary edema. No pleural effusion. No pneumothorax. No acute osseous abnormality. IMPRESSION: No active disease. Electronically Signed   By: Morgane  Naveau M.D.   On: 11/26/2023 21:06   Assessment/Plan Ricky Shaw is a 23 y.o. male with medical history significant for type 1 diabetes mellitus, DKA and HTN who presented to the ED for evaluation of hyperglycemia, nausea and headache and admitted for diabetic ketoacidosis  # T1DM,  uncontrolled # DKA - Presented with 1 day of headache, chest discomfort, abdominal pain, nausea and vomiting, improving - Found to have hyperglycemia, AGMA, and evidence of ketosis - Hx of uncontrolled diabetes with last A1c 11.9% 3 months ago - S/p LR bolus and initiation of insulin  drip in ED - Pt nontoxic appearing, hemodynamically stable - Continue insulin  drip and keep NPO - LR @ 125 mL/hr until CBG less than 250 - Switch to D5-LR when 1 CBG less than 250 - BMP Q4H, CBG Q1H, BHB Q8H - Once anion gap closed 2, start CM diet and if able to eat, start insulin  pump - DC fluids if eating, drinking, and off insulin  drip and start SSI-S - Follow-up repeat A1c  # HTN - BP stable SBP in the 110s to 130s - Continue lisinopril    DVT prophylaxis: Lovenox      Code Status: Full Code  Consults called: None  Family Communication: No family at  bedside  Severity of Illness: The appropriate patient status for this patient is OBSERVATION. Observation status is judged to be reasonable and necessary in order to provide the required intensity of service to ensure the patient's safety. The patient's presenting symptoms, physical exam findings, and initial radiographic and laboratory data in the context of their medical condition is felt to place them at decreased risk for further clinical deterioration. Furthermore, it is anticipated that the patient will be medically stable for discharge from the hospital within 2 midnights of admission.   Level of care: Stepdown   This record has been created using Conservation officer, historic buildings. Errors have been sought and corrected, but may not always be located. Such creation errors do not reflect on the standard of care.   Lou Claretta HERO, MD 11/26/2023, 11:25 PM [facility] Pager: [phone] Isaiah 41:10   If 7PM-7AM, please contact night-coverage [email or web address] Password [email or web address]

## 2023-11-26 NOTE — ED Triage Notes (Addendum)
 Pt in from home via [facility] with reported nausea, HA and hyperglycemia. Pt states he has DM and last CBG was 289 PTA. Given 500ml NS en route and 4mg  zofran  in 18G to LAC. Pt also reporting abdominal cramping and chest discomfort  VS en route: 152/84 106HR 98%

## 2023-11-26 NOTE — ED Notes (Signed)
 ED TO INPATIENT HANDOFF REPORT  Name/Age/Gender Ricky Shaw 23 y.o. male  Code Status Code Status History     Date Active Date Inactive Code Status Order ID Comments User Context   02/01/2023 1807 02/02/2023 2341 Full Code [id number]  Raenelle Coria, MD ED   08/28/2022 1005 08/29/2022 2302 Full Code [id number]  Zella Katha HERO, MD ED   12/08/2021 1306 12/11/2021 2003 Full Code [id number]  Rehman, Areeg N, DO ED   03/21/2021 1438 03/23/2021 1458 Full Code [id number]  Laurita Cort DASEN, MD ED   04/20/2020 1056 04/21/2020 2203 Full Code [id number]  Jama Dawn I, MD ED   08/19/2016 1713 08/21/2016 1413 Full Code [id number]  Hilzendager, Rosina SAILOR, MD ED   09/04/2014 1325 09/07/2014 2017 Full Code [id number]  Nat Geralds, MD ED   07/28/2014 1212 07/30/2014 1759 Full Code [id number]  Tracey Torrie CROME, MD ED   11/14/2011 0205 11/17/2011 1734 Full Code [id number]  Permar, Corean CROME, MD ED    Questions for Most Recent Historical Code Status (Order [id number])     Question Answer   By: Consent: discussion documented in EHR            Home/SNF/Other Home  Chief Complaint DKA (diabetic ketoacidosis) (HCC) [E11.10]  Level of Care/Admitting Diagnosis ED Disposition     ED Disposition  Admit   Condition  --   Comment  Hospital Area: [facility] [[id number]]  Level of Care: Stepdown [14]  Admit to SDU based on following criteria: Other see comments  Comments: DKA  May place patient in observation at [facility] or [facility] if equivalent level of care is available:: No  Covid Evaluation: Asymptomatic - no recent exposure (last 10 days) testing not required  Diagnosis: DKA (diabetic ketoacidosis) [facility]) [[id number]]  Admitting Physician: LOU CLARETTA HERO [[id number]]  Attending Physician: LOU CLARETTA HERO [phone]  For patients discharging to extended facilities (i.e. SNF, AL, group homes or LTAC) initiate:: Discharge to SNF/Facility Placement COVID-19 Lab Testing Protocol           Medical History Past Medical History:  Diagnosis Date   Diabetes mellitus 11/14/2011   Diabetes mellitus without complication (HCC)    Phreesia 03/03/2020    Allergies Allergies  Allergen Reactions   Amoxicillin Hives    IV Location/Drains/Wounds Patient Lines/Drains/Airways Status     Active Line/Drains/Airways     Name Placement date Placement time Site Days   Peripheral IV 11/26/23 18 G Left Antecubital 11/26/23  1918  Antecubital  less than 1            Labs/Imaging Results for orders placed or performed during the hospital encounter of 11/26/23 (from the past 48 hours)  CBG monitoring, ED     Status: Abnormal   Collection Time: 11/26/23  7:39 PM  Result Value Ref Range   Glucose-Capillary 353 (H) 70 - 99 mg/dL    Comment: Glucose reference range applies only to samples taken after fasting for at least 8 hours.   Comment 1 Notify RN   CBC     Status: Abnormal   Collection Time: 11/26/23  7:53 PM  Result Value Ref Range   WBC 10.7 (H) 4.0 - 10.5 K/uL   RBC 5.57 4.22 - 5.81 MIL/uL   Hemoglobin 16.0 13.0 - 17.0 g/dL   HCT 51.1 60.9 - 47.9 %   MCV 87.6 80.0 - 100.0 fL   MCH 28.7 26.0 - 34.0 pg   MCHC 32.8 30.0 - 36.0 g/dL  RDW 13.2 11.5 - 15.5 %   Platelets 298 150 - 400 K/uL   nRBC 0.0 0.0 - 0.2 %    Comment: Performed at [facility], [address] [place], [place] [address]  Comprehensive metabolic panel     Status: Abnormal   Collection Time: 11/26/23  7:53 PM  Result Value Ref Range   Sodium 130 (L) 135 - 145 mmol/L   Potassium 4.9 3.5 - 5.1 mmol/L   Chloride 100 98 - 111 mmol/L   CO2 11 (L) 22 - 32 mmol/L   Glucose, Bld 363 (H) 70 - 99 mg/dL    Comment: Glucose reference range applies only to samples taken after fasting for at least 8 hours.   BUN 19 6 - 20 mg/dL   Creatinine, Ser 8.79 0.61 - 1.24 mg/dL   Calcium 9.6 8.9 - 89.6 mg/dL   Total Protein 8.4 (H) 6.5 - 8.1 g/dL   Albumin 4.6 3.5 - 5.0 g/dL   AST 14 (L) 15 -  41 U/L   ALT 13 0 - 44 U/L   Alkaline Phosphatase 75 38 - 126 U/L   Total Bilirubin 1.9 (H) 0.0 - 1.2 mg/dL   GFR, Estimated >39 >39 mL/min    Comment: (NOTE) Calculated using the CKD-EPI Creatinine Equation (2021)    Anion gap 19 (H) 5 - 15    Comment: Performed at [facility], [address] [place], [place] [address]  Beta-hydroxybutyric acid     Status: Abnormal   Collection Time: 11/26/23  7:53 PM  Result Value Ref Range   Beta-Hydroxybutyric Acid >8.00 (H) 0.05 - 0.27 mmol/L    Comment: RESULT CONFIRMED BY MANUAL DILUTION Performed at [facility], [address], [place], [place] [address]   Lipase, blood     Status: None   Collection Time: 11/26/23  7:53 PM  Result Value Ref Range   Lipase 19 11 - 51 U/L    Comment: Performed at [facility], [address], [place], [place] [address]  CBG monitoring, ED     Status: Abnormal   Collection Time: 11/26/23  9:28 PM  Result Value Ref Range   Glucose-Capillary 408 (H) 70 - 99 mg/dL    Comment: Glucose reference range applies only to samples taken after fasting for at least 8 hours.  Urinalysis, Routine w reflex microscopic -Urine, Clean Catch     Status: Abnormal   Collection Time: 11/26/23  9:54 PM  Result Value Ref Range   Color, Urine STRAW (A) YELLOW   APPearance CLEAR CLEAR   Specific Gravity, Urine 1.024 1.005 - 1.030   pH 5.0 5.0 - 8.0   Glucose, UA >=500 (A) NEGATIVE mg/dL   Hgb urine dipstick SMALL (A) NEGATIVE   Bilirubin Urine NEGATIVE NEGATIVE   Ketones, ur 80 (A) NEGATIVE mg/dL   Protein, ur 899 (A) NEGATIVE mg/dL   Nitrite NEGATIVE NEGATIVE   Leukocytes,Ua NEGATIVE NEGATIVE   RBC / HPF 0-5 0 - 5 RBC/hpf   WBC, UA 0-5 0 - 5 WBC/hpf   Bacteria, UA NONE SEEN NONE SEEN   Squamous Epithelial / HPF 0-5 0 - 5 /HPF   Mucus PRESENT    Hyaline Casts, UA PRESENT     Comment: Performed at [facility], [address], [place], [place]  [address]  CBG monitoring, ED     Status: Abnormal   Collection Time: 11/26/23 10:07 PM  Result Value Ref Range  Glucose-Capillary 396 (H) 70 - 99 mg/dL    Comment: Glucose reference range applies only to samples taken after fasting for at least 8 hours.   Comment 1 Notify RN    DG Chest Portable 1 View Result Date: 11/26/2023 CLINICAL DATA:  chest pain, vomiting EXAM: PORTABLE CHEST 1 VIEW COMPARISON:  Chest x-ray 03/21/2021 FINDINGS: The heart and mediastinal contours are within normal limits. No focal consolidation. No pulmonary edema. No pleural effusion. No pneumothorax. No acute osseous abnormality. IMPRESSION: No active disease. Electronically Signed   By: Morgane  Naveau M.D.   On: 11/26/2023 21:06    Pending Labs Unresulted Labs (From admission, onward)     Start     Ordered   11/26/23 2035  Blood gas, venous  (Diabetes Ketoacidosis (DKA))  ONCE - STAT,   STAT        11/26/23 2035            Vitals/Pain Today's Vitals   11/26/23 1941 11/26/23 1942 11/26/23 2102  BP: 130/71    Pulse: (!) 113    Resp: 17    Temp: 98.3 F (36.8 C)    TempSrc: Oral    SpO2: 99%    Weight:  74.8 kg   PainSc: 8   10-Worst pain ever    Isolation Precautions No active isolations  Medications Medications  insulin  regular, human (MYXREDLIN ) 100 units/ 100 mL infusion (12 Units/hr Intravenous New Bag/Given 11/26/23 2211)  lactated ringers  infusion ( Intravenous New Bag/Given 11/26/23 2217)  dextrose  5 % in lactated ringers  infusion (has no administration in time range)  dextrose  50 % solution 0-50 mL (has no administration in time range)  potassium chloride  10 mEq in 100 mL IVPB (10 mEq Intravenous New Bag/Given 11/26/23 2133)  ondansetron  (ZOFRAN ) injection 4 mg (4 mg Intravenous Given 11/26/23 2101)  lactated ringers  bolus 1,496 mL (1,496 mLs Intravenous New Bag/Given 11/26/23 2105)  ketorolac  (TORADOL ) 15 MG/ML injection 15 mg (15 mg Intravenous Given 11/26/23 2102)    Mobility walks  with person assist

## 2023-11-26 NOTE — ED Provider Notes (Signed)
 [place] EMERGENCY DEPARTMENT AT [facility] Provider Note   CSN: [id number] Arrival date & time: 11/26/23  1931     Patient presents with: Hyperglycemia and Nausea   Ricky Shaw is a 23 y.o. male.    Hyperglycemia    Patient has a history of diabetes DKA.  Patient presents ED with complaints of hyperglycemia and headache.  Patient states he started having some trouble with headaches generalized chest discomfort abdominal discomfort.  He was feeling nauseated but that did not vomit.  His symptoms worsen and he started vomiting this evening.  Patient has continued to have chest and abdominal cramping.  Patient states he has been taking his diabetes medications.  He denies any fevers.  No focal numbness or weakness.  No diarrhea no dysuria  Prior to Admission medications   Medication Sig Start Date End Date Taking? Authorizing Provider  Continuous Glucose Sensor (DEXCOM G6 SENSOR) MISC Change dexcom sensor every 10 days. 09/12/23   Verdon Darnel, NP  Continuous Glucose Transmitter (DEXCOM G6 TRANSMITTER) MISC Use as directed 09/12/23   Verdon Darnel, NP  insulin  aspart (NOVOLOG  FLEXPEN) 100 UNIT/ML FlexPen Inject up to 50 units subcutaneously daily if pump fails. Patient not taking: Reported on 09/12/2023 08/01/23   Verdon Darnel, NP  insulin  glargine (LANTUS  SOLOSTAR) 100 UNIT/ML Solostar Pen Inject up to 50 units per day when not using insulin  pump. Patient not taking: Reported on 09/12/2023 08/01/23   Verdon Darnel, NP  insulin  lispro (HUMALOG ) 100 UNIT/ML injection USE 300 UNITS VIA INSULIN  PUMP EVERY 48 HOURS 09/12/23   Verdon Darnel, NP  Insulin  Pen Needle 32G X 4 MM MISC 1 applicator by Does not apply route 3 (three) times daily. Inject up to 5 x per day. 12/12/21   Verdon Darnel, NP  lisinopril  (ZESTRIL ) 5 MG tablet Take 1 tablet (5 mg total) by mouth daily. 09/12/23   Verdon Darnel, NP    Allergies: Amoxicillin    Review of Systems  Updated Vital  Signs BP 130/71 (BP Location: Left Arm)   Pulse (!) 113   Temp 98.3 F (36.8 C) (Oral)   Resp 17   Wt 74.8 kg   SpO2 99%   BMI 25.83 kg/m   Physical Exam Vitals and nursing note reviewed.  Constitutional:      General: He is in acute distress.     Appearance: He is well-developed. He is ill-appearing.  HENT:     Head: Normocephalic and atraumatic.     Right Ear: External ear normal.     Left Ear: External ear normal.  Eyes:     General: No scleral icterus.       Right eye: No discharge.        Left eye: No discharge.     Conjunctiva/sclera: Conjunctivae normal.  Neck:     Trachea: No tracheal deviation.  Cardiovascular:     Rate and Rhythm: Normal rate and regular rhythm.  Pulmonary:     Effort: Pulmonary effort is normal. No respiratory distress.     Breath sounds: Normal breath sounds. No stridor. No wheezing or rales.  Abdominal:     General: Bowel sounds are normal. There is no distension.     Palpations: Abdomen is soft.     Tenderness: There is no abdominal tenderness. There is no guarding or rebound.     Comments: Patient vomited at the bedside  Musculoskeletal:        General: No tenderness or deformity.  Cervical back: Neck supple.  Skin:    General: Skin is warm and dry.     Findings: No rash.  Neurological:     General: No focal deficit present.     Mental Status: He is alert.     Cranial Nerves: No cranial nerve deficit, dysarthria or facial asymmetry.     Sensory: No sensory deficit.     Motor: No abnormal muscle tone or seizure activity.     Coordination: Coordination normal.  Psychiatric:        Mood and Affect: Mood normal.     (all labs ordered are listed, but only abnormal results are displayed) Labs Reviewed  CBC - Abnormal; Notable for the following components:      Result Value   WBC 10.7 (*)    All other components within normal limits  COMPREHENSIVE METABOLIC PANEL WITH GFR - Abnormal; Notable for the following components:    Sodium 130 (*)    CO2 11 (*)    Glucose, Bld 363 (*)    Total Protein 8.4 (*)    AST 14 (*)    Total Bilirubin 1.9 (*)    Anion gap 19 (*)    All other components within normal limits  BETA-HYDROXYBUTYRIC ACID - Abnormal; Notable for the following components:   Beta-Hydroxybutyric Acid >8.00 (*)    All other components within normal limits  CBG MONITORING, ED - Abnormal; Notable for the following components:   Glucose-Capillary 353 (*)    All other components within normal limits  CBG MONITORING, ED - Abnormal; Notable for the following components:   Glucose-Capillary 408 (*)    All other components within normal limits  CBG MONITORING, ED - Abnormal; Notable for the following components:   Glucose-Capillary 396 (*)    All other components within normal limits  LIPASE, BLOOD  URINALYSIS, ROUTINE W REFLEX MICROSCOPIC  BLOOD GAS, VENOUS    EKG: EKG Interpretation Date/Time:  Tuesday November 26 2023 22:13:52 EDT Ventricular Rate:  87 PR Interval:  141 QRS Duration:  98 QT Interval:  352 QTC Calculation: 424 R Axis:   62  Text Interpretation: Sinus arrhythmia Nonspecific T abnormalities, inferior leads Confirmed by Randol Simmonds [phone]) on 11/26/2023 10:17:21 PM  Radiology: [facility] Chest Portable 1 View Result Date: 11/26/2023 CLINICAL DATA:  chest pain, vomiting EXAM: PORTABLE CHEST 1 VIEW COMPARISON:  Chest x-ray 03/21/2021 FINDINGS: The heart and mediastinal contours are within normal limits. No focal consolidation. No pulmonary edema. No pleural effusion. No pneumothorax. No acute osseous abnormality. IMPRESSION: No active disease. Electronically Signed   By: Morgane  Naveau M.D.   On: 11/26/2023 21:06     .Critical Care  Performed by: Randol Simmonds, MD Authorized by: Randol Simmonds, MD   Critical care provider statement:    Critical care time (minutes):  30   Critical care was time spent personally by me on the following activities:  Development of treatment plan with patient or  surrogate, discussions with consultants, evaluation of patient's response to treatment, examination of patient, ordering and review of laboratory studies, ordering and review of radiographic studies, ordering and performing treatments and interventions, pulse oximetry, re-evaluation of patient's condition and review of old charts    Medications Ordered in the ED  insulin  regular, human (MYXREDLIN ) 100 units/ 100 mL infusion (12 Units/hr Intravenous New Bag/Given 11/26/23 2211)  lactated ringers  infusion (has no administration in time range)  dextrose  5 % in lactated ringers  infusion (has no administration in time range)  dextrose  50 %  solution 0-50 mL (has no administration in time range)  potassium chloride  10 mEq in 100 mL IVPB (10 mEq Intravenous New Bag/Given 11/26/23 2133)  ondansetron  (ZOFRAN ) injection 4 mg (4 mg Intravenous Given 11/26/23 2101)  lactated ringers  bolus 1,496 mL (1,496 mLs Intravenous New Bag/Given 11/26/23 2105)  ketorolac  (TORADOL ) 15 MG/ML injection 15 mg (15 mg Intravenous Given 11/26/23 2102)    Clinical Course as of 11/26/23 2217  Tue Nov 26, 2023  2115 Beta-hydroxybutyric acid(!) Patient hydroxybutyric acid levels elevated.  Metabolic panel shows hyperglycemia with anion gap of 19.  CBC normal. [JK]  2116 DG Chest Portable 1 View No acute findings [JK]  2215 Case discussed with Dr Lou regarding admission  [JK]    Clinical Course User Index [JK] Randol Simmonds, MD                                 Medical Decision Making Problems Addressed: Diabetic ketoacidosis without coma associated with type 1 diabetes mellitus [facility]): acute illness or injury that poses a threat to life or bodily functions  Amount and/or Complexity of Data Reviewed Labs: ordered. Decision-making details documented in ED Course. Radiology: ordered and independent interpretation performed. Decision-making details documented in ED Course.  Risk Prescription drug management. Decision  regarding hospitalization.   Patient presented to the ED for evaluation of nausea vomiting abdominal pain chest pain.  Patient does have a history of diabetes and previous bouts of DKA.  In the ED patient did not have any focal abdominal tenderness.  His x-ray did not show any signs of pneumonia  Patient's laboratory test were notable for anion gap metabolic acidosis and hyperglycemia.  He also had elevated beta hydroxybutyric acid.  Findings consistent with DKA.  Patient was started on IV fluids.  He was also started on insulin  infusion.  He was also given antiemetics.  Patient is feeling somewhat better.  No vomiting at this point.  I will consult medical service for admission further treatment.     Final diagnoses:  Diabetic ketoacidosis without coma associated with type 1 diabetes mellitus [facility])    ED Discharge Orders     None          Randol Simmonds, MD 11/26/23 2217

## 2023-11-27 DIAGNOSIS — E1010 Type 1 diabetes mellitus with ketoacidosis without coma: Secondary | ICD-10-CM | POA: Diagnosis not present

## 2023-11-27 LAB — BASIC METABOLIC PANEL WITH GFR
Anion gap: 11 (ref 5–15)
Anion gap: 12 (ref 5–15)
Anion gap: 12 (ref 5–15)
Anion gap: 12 (ref 5–15)
Anion gap: 19 — ABNORMAL HIGH (ref 5–15)
BUN: 12 mg/dL (ref 6–20)
BUN: 13 mg/dL (ref 6–20)
BUN: 13 mg/dL (ref 6–20)
BUN: 15 mg/dL (ref 6–20)
BUN: 17 mg/dL (ref 6–20)
CO2: 12 mmol/L — ABNORMAL LOW (ref 22–32)
CO2: 14 mmol/L — ABNORMAL LOW (ref 22–32)
CO2: 19 mmol/L — ABNORMAL LOW (ref 22–32)
CO2: 19 mmol/L — ABNORMAL LOW (ref 22–32)
CO2: 20 mmol/L — ABNORMAL LOW (ref 22–32)
Calcium: 8.2 mg/dL — ABNORMAL LOW (ref 8.9–10.3)
Calcium: 8.2 mg/dL — ABNORMAL LOW (ref 8.9–10.3)
Calcium: 8.5 mg/dL — ABNORMAL LOW (ref 8.9–10.3)
Calcium: 9.1 mg/dL (ref 8.9–10.3)
Calcium: 9.2 mg/dL (ref 8.9–10.3)
Chloride: 103 mmol/L (ref 98–111)
Chloride: 104 mmol/L (ref 98–111)
Chloride: 104 mmol/L (ref 98–111)
Chloride: 104 mmol/L (ref 98–111)
Chloride: 106 mmol/L (ref 98–111)
Creatinine, Ser: 0.66 mg/dL (ref 0.61–1.24)
Creatinine, Ser: 0.91 mg/dL (ref 0.61–1.24)
Creatinine, Ser: 0.92 mg/dL (ref 0.61–1.24)
Creatinine, Ser: 0.96 mg/dL (ref 0.61–1.24)
Creatinine, Ser: 1.16 mg/dL (ref 0.61–1.24)
GFR, Estimated: >60 mL/min (ref >60)
GFR, Estimated: >60 mL/min (ref >60)
GFR, Estimated: >60 mL/min (ref >60)
GFR, Estimated: >60 mL/min (ref >60)
GFR, Estimated: >60 mL/min (ref >60)
Glucose, Bld: 147 mg/dL — ABNORMAL HIGH (ref 70–99)
Glucose, Bld: 158 mg/dL — ABNORMAL HIGH (ref 70–99)
Glucose, Bld: 182 mg/dL — ABNORMAL HIGH (ref 70–99)
Glucose, Bld: 249 mg/dL — ABNORMAL HIGH (ref 70–99)
Glucose, Bld: 337 mg/dL — ABNORMAL HIGH (ref 70–99)
Potassium: 3.6 mmol/L (ref 3.5–5.1)
Potassium: 3.8 mmol/L (ref 3.5–5.1)
Potassium: 4 mmol/L (ref 3.5–5.1)
Potassium: 4.1 mmol/L (ref 3.5–5.1)
Potassium: 4.5 mmol/L (ref 3.5–5.1)
Sodium: 130 mmol/L — ABNORMAL LOW (ref 135–145)
Sodium: 134 mmol/L — ABNORMAL LOW (ref 135–145)
Sodium: 135 mmol/L (ref 135–145)
Sodium: 135 mmol/L (ref 135–145)
Sodium: 137 mmol/L (ref 135–145)

## 2023-11-27 LAB — CBC
HCT: 43 % (ref 39.0–52.0)
Hemoglobin: 14.2 g/dL (ref 13.0–17.0)
MCH: 29 pg (ref 26.0–34.0)
MCHC: 33 g/dL (ref 30.0–36.0)
MCV: 87.9 fL (ref 80.0–100.0)
Platelets: 257 K/uL (ref 150–400)
RBC: 4.89 MIL/uL (ref 4.22–5.81)
RDW: 13.3 % (ref 11.5–15.5)
WBC: 10.7 K/uL — ABNORMAL HIGH (ref 4.0–10.5)
nRBC: 0 % (ref 0.0–0.2)

## 2023-11-27 LAB — GLUCOSE, CAPILLARY
Glucose-Capillary: 111 mg/dL — ABNORMAL HIGH (ref 70–99)
Glucose-Capillary: 133 mg/dL — ABNORMAL HIGH (ref 70–99)
Glucose-Capillary: 136 mg/dL — ABNORMAL HIGH (ref 70–99)
Glucose-Capillary: 139 mg/dL — ABNORMAL HIGH (ref 70–99)
Glucose-Capillary: 154 mg/dL — ABNORMAL HIGH (ref 70–99)
Glucose-Capillary: 158 mg/dL — ABNORMAL HIGH (ref 70–99)
Glucose-Capillary: 179 mg/dL — ABNORMAL HIGH (ref 70–99)
Glucose-Capillary: 189 mg/dL — ABNORMAL HIGH (ref 70–99)
Glucose-Capillary: 193 mg/dL — ABNORMAL HIGH (ref 70–99)
Glucose-Capillary: 207 mg/dL — ABNORMAL HIGH (ref 70–99)
Glucose-Capillary: 210 mg/dL — ABNORMAL HIGH (ref 70–99)
Glucose-Capillary: 220 mg/dL — ABNORMAL HIGH (ref 70–99)
Glucose-Capillary: 228 mg/dL — ABNORMAL HIGH (ref 70–99)
Glucose-Capillary: 304 mg/dL — ABNORMAL HIGH (ref 70–99)
Glucose-Capillary: 320 mg/dL — ABNORMAL HIGH (ref 70–99)

## 2023-11-27 LAB — PHOSPHORUS: Phosphorus: 3.2 mg/dL (ref 2.5–4.6)

## 2023-11-27 LAB — HEMOGLOBIN A1C
Hgb A1c MFr Bld: 11.4 % — ABNORMAL HIGH (ref 4.8–5.6)
Mean Plasma Glucose: 280 mg/dL

## 2023-11-27 LAB — BETA-HYDROXYBUTYRIC ACID
Beta-Hydroxybutyric Acid: 3.9 mmol/L — ABNORMAL HIGH (ref 0.05–0.27)
Beta-Hydroxybutyric Acid: 7.09 mmol/L — ABNORMAL HIGH (ref 0.05–0.27)

## 2023-11-27 LAB — MRSA NEXT GEN BY PCR, NASAL: MRSA by PCR Next Gen: NOT DETECTED

## 2023-11-27 LAB — MAGNESIUM: Magnesium: 1.6 mg/dL — ABNORMAL LOW (ref 1.7–2.4)

## 2023-11-27 MED ORDER — MAGNESIUM SULFATE 2 GM/50ML IV SOLN
INTRAVENOUS | Status: AC
  Administered 2023-11-27: 2 g via INTRAVENOUS
  Filled 2023-11-27: qty 50

## 2023-11-27 MED ORDER — MAGNESIUM SULFATE 2 GM/50ML IV SOLN: 2.0000 g | Freq: Once | INTRAVENOUS | Status: AC

## 2023-11-27 MED ORDER — INSULIN PUMP
Freq: Three times a day (TID) | SUBCUTANEOUS | Status: DC
  Filled 2023-11-27: qty 1

## 2023-11-27 NOTE — Inpatient Diabetes Management (Signed)
 Inpatient Diabetes Program Recommendations  AACE/ADA: New Consensus Statement on Inpatient Glycemic Control (2015)  Target Ranges:  Prepandial:   less than 140 mg/dL      Peak postprandial:   less than 180 mg/dL (1-2 hours)      Critically ill patients:  140 - 180 mg/dL   Lab Results  Component Value Date   GLUCAP 220 (H) 11/27/2023   HGBA1C 11.9 (A) 08/01/2023    Review of Glycemic Control  Latest Reference Range & Units 11/27/23 08:55  CO2 22 - 32 mmol/L 19 (L)  Glucose 70 - 99 mg/dL 852 (H)  Beta-Hydroxybutyric Acid 0.05 - 0.27 mmol/L 3.90 (H)  (L): Data is abnormally low (H): Data is abnormally high  Diabetes history: DM1 Outpatient Diabetes medications: T-Slim insulin  pump with the Dexcom G6 in auto mode Basal Rates  12AM 2.10  3am 2.15  6am 2.20  8am 2.50  9pm     2.40  56.85  units per day  Insulin  to Carbohydrate Ratio  12AM 8  3am 8  6am 6  8am 5  9pm 5  Insulin  Sensitivity Factor  12AM 24  3am 24  6am 25  8am     20  9pm    25  Target Blood Glucose  12AM 110  6am 110  9pm 110     Current orders for Inpatient glycemic control: IV insulin   Inpatient Diabetes Program Recommendations:    Once acidosis clears and MD is ready to transition to SQ insulin , please consider transitioning back to insulin  pump.  Once insulin  pump is connected to patient, allow IV insulin  to infuse for 1 hour and then discontinue IV insulin .    Met with this very kind young man at bedside.  He was diagnosed with T1DM in 2013.  He is [facility] with [facility].  Last visit was on 11/06/23 with Jacques Penton.  No pump adjustments at that time.  When patient was in the ED, his insulin  pump, vial of insulin  and insulin  pens were sent to pharmacy.  I obtained these items and returned them to patient and he placed them with his belongings.    He states he did not feel like he was going into DKA over the weekend.  He states his BG was normal but he started  having chest pain and SOB; did not have any N/V or fatigue like he normally has with DKA.  He changed his insulin  pump site last on Monday 7/14 and is due to be changed on 7/18.  We removed his site to check for kinks in the catheter.  The catheter appeared normal.  He does count carbs and boluses when he eats.  He does admit to occasionally forgetting to bolus.   His last A1c was 11.9% in March 2025  Discussed obtaining ketone strips at home to test for ketones when he feels unwell and increase water  intake if ketones are present.  Ensure insulin  is not expired, has been refrigerated while unopened, and open insulin  vials are only good for 28 days at room temperature.   He tells me he changes his insulin  pump site every 3 days as recommended.    Thank you, Wyvonna Pinal, MSN, CDCES Diabetes Coordinator Inpatient Diabetes Program [phone] (team pager from 8a-5p)

## 2023-11-27 NOTE — Discharge Summary (Signed)
 Physician Discharge Summary  Ricky Shaw FMW:[id number] DOB: [birth year] DOA: 11/26/2023  PCP: [name], MD  Admit date: 11/26/2023 Discharge date: 11/27/2023  Admitted From: home Discharge disposition: home  Recommendations at discharge:  Ensure compliance with insulin  pump   Brief narrative: Ricky Shaw is a 23 y.o. male with PMH significant for type 1 diabetes since the age of 65, on insulin  pump since the age of 85.  Lives at home with his mother.  Reports compliance to insulin .  Mother helps him with diabetes management. Last episode of DKA was in September 2024. [facility]s up at [facility] health endocrinology.  7/15, patient presented to the ED with complaint of nausea, headache, elevated blood sugar level. On the day of presentation, around 4 PM , he started having headache and chest discomfort described as pressure. His blood sugar was elevated to the 300s. His symptoms did not improved he started having abdominal pain and nausea so he presented to the ED for evaluation.  Patient reports his insulin  pump is working fine and he has not had any dietary indiscretion.   While in the ED, he had 1 episode of vomiting.  Other symptoms improved. Initial vital signs stable   Initial labs significant for sodium 130, K+ 4.9, bicarb 11, glucose 363, creatinine 1.20, anion gap 19,  WBC 10.7, BHB >8.0,  VBG showed pH low at 7.19, bicarb of 12 UA shows significant glucosuria, ketonuria and proteinuria. EKG shows sinus rhythm with nonspecific T wave changes.  CXR shows no acute cardiopulmonary disease.   Patient was started on DKA protocol with IV insulin  drip, IV fluid, electrolyte management and subsequent monitoring of chemistry. Kept on observation to [facility]  Subjective: Patient was seen and examined this morning.  Pleasant young African-American male.  Lying down in bed.  Not in distress.  Does not feel nauseous.  Has not thrown up since the 1 episode in the ED. No abdominal  pain.  Wants to try food. Chart reviewed. Hemodynamically stable Blood sugar level has been trending under 150 since early this morning Most recent BMP from noon showed potassium 3.8, anion gap closed.  Hospital course: DKA  Presented with few hours of headache, nausea, elevated blood glucose  Initial labs showed hyperglycemia, anion gap metabolic acidosis, ketoacidosis Started on DKA protocol with IV insulin  drip, IV fluid, electrolyte management and chemistry monitoring. Feels much better this morning.   On insulin  pump at home.  Patient reports his insulin  pump was working fine and he is not sure how DKA was triggered. Diabetes care coordinator consult appreciated Tolerated soft diet.  Transitioned from insulin  drip to insulin  pump   Type 1 diabetes mellitus Last A1c from March 2025 was uncontrolled at 11.9.  Pending repeat A1c PTA meds-insulin  pump Insulin  pump has been restarted.  Ensure compliance to it.  Continue to follow-up with endocrinologist as an outpatient On lisinopril  for renal protection  Goals of care   Code Status: Full Code   Diet:  Diet Order             Diet Carb Modified Fluid consistency: Thin; Room service appropriate? Yes  Diet effective now           Diet general                   Nutritional status:  Body mass index is 24.34 kg/m.       Wounds:  -    Discharge Exam:   Vitals:   11/27/23 0900  11/27/23 0906 11/27/23 1000 11/27/23 1200  BP: 120/67 120/67 132/88   Pulse: 75  76   Resp: 10  15   Temp:    98.3 F (36.8 C)  TempSrc:    Oral  SpO2: 99%  99%   Weight:      Height:        Body mass index is 24.34 kg/m.  General exam: Pleasant, young African-American male.  Not in distress Skin: No rashes, lesions or ulcers. HEENT: Atraumatic, normocephalic, no obvious bleeding Lungs: Clear to auscultation bilaterally,  CVS: S1, S2, no murmur,   GI/Abd: Soft, nontender, nondistended, bowel sound present,   CNS: Alert, awake,  oriented x 3 Psychiatry: Mood appropriate Extremities: No pedal edema, no calf tenderness,   Follow ups:    Follow-up Information     [name], MD Follow up.   Specialty: Pediatrics Contact information: [address] [place] [address] [phone]                 Discharge Instructions:   Discharge Instructions     Call MD for:  difficulty breathing, headache or visual disturbances   Complete by: As directed    Call MD for:  extreme fatigue   Complete by: As directed    Call MD for:  hives   Complete by: As directed    Call MD for:  persistant dizziness or light-headedness   Complete by: As directed    Call MD for:  persistant nausea and vomiting   Complete by: As directed    Call MD for:  severe uncontrolled pain   Complete by: As directed    Call MD for:  temperature >100.4   Complete by: As directed    Diet general   Complete by: As directed    Discharge instructions   Complete by: As directed    Recommendations at discharge:   Ensure compliance with insulin  pump  Discharge instructions for diabetes mellitus: Check blood sugar 3 times a day and bedtime at home. If blood sugar running above 200 or less than 70 please call your MD to adjust insulin . If you notice signs and symptoms of hypoglycemia (low blood sugar) like jitteriness, confusion, thirst, tremor and sweating, please check blood sugar, drink sugary drink/biscuits/sweets to increase sugar level and call MD or return to ER.      General discharge instructions: Follow with Primary MD [name], MD in 7 days  Please request your PCP  to go over your hospital tests, procedures, radiology results at the follow up. Please get your medicines reviewed and adjusted.  Your PCP may decide to repeat certain labs or tests as needed. Do not drive, operate heavy machinery, perform activities at heights, swimming or participation in water  activities or provide baby sitting services if  your were admitted for syncope or siezures until you have seen by Primary MD or a Neurologist and advised to do so again. [place]  Controlled Substance Reporting System database was reviewed. Do not drive, operate heavy machinery, perform activities at heights, swim, participate in water  activities or provide baby-sitting services while on medications for pain, sleep and mood until your outpatient physician has reevaluated you and advised to do so again.  You are strongly recommended to comply with the dose, frequency and duration of prescribed medications. Activity: As tolerated with Full fall precautions use walker/cane & assistance as needed Avoid using any recreational substances like cigarette, tobacco, alcohol, or non-prescribed drug. If you experience worsening of your admission  symptoms, develop shortness of breath, life threatening emergency, suicidal or homicidal thoughts you must seek medical attention immediately by calling 911 or calling your MD immediately  if symptoms less severe. You must read complete instructions/literature along with all the possible adverse reactions/side effects for all the medicines you take and that have been prescribed to you. Take any new medicine only after you have completely understood and accepted all the possible adverse reactions/side effects.  Wear Seat belts while driving. You were cared for by a hospitalist during your hospital stay. If you have any questions about your discharge medications or the care you received while you were in the hospital after you are discharged, you can call the unit and ask to speak with the hospitalist or the covering physician. Once you are discharged, your primary care physician will handle any further medical issues. Please note that NO REFILLS for any discharge medications will be authorized once you are discharged, as it is imperative that you return to your primary care physician (or establish a relationship with a  primary care physician if you do not have one).   Increase activity slowly   Complete by: As directed        Discharge Medications:   Allergies as of 11/27/2023       Reactions   Amoxicillin Hives        Medication List     TAKE these medications    Dexcom G6 Sensor Misc Change dexcom sensor every 10 days.   Dexcom G6 Transmitter Misc Use as directed   insulin  lispro 100 UNIT/ML injection Commonly known as: HUMALOG  USE 300 UNITS VIA INSULIN  PUMP EVERY 48 HOURS What changed: additional instructions   Insulin  Pen Needle 32G X 4 MM Misc 1 applicator by Does not apply route 3 (three) times daily. Inject up to 5 x per day.   Lantus  SoloStar 100 UNIT/ML Solostar Pen Generic drug: insulin  glargine Inject up to 50 units per day when not using insulin  pump.   lisinopril  5 MG tablet Commonly known as: ZESTRIL  Take 1 tablet (5 mg total) by mouth daily.   NovoLOG  FlexPen 100 UNIT/ML FlexPen Generic drug: insulin  aspart Inject up to 50 units subcutaneously daily if pump fails.         The results of significant diagnostics from this hospitalization (including imaging, microbiology, ancillary and laboratory) are listed below for reference.    Procedures and Diagnostic Studies:   DG Chest Portable 1 View Result Date: 11/26/2023 CLINICAL DATA:  chest pain, vomiting EXAM: PORTABLE CHEST 1 VIEW COMPARISON:  Chest x-ray 03/21/2021 FINDINGS: The heart and mediastinal contours are within normal limits. No focal consolidation. No pulmonary edema. No pleural effusion. No pneumothorax. No acute osseous abnormality. IMPRESSION: No active disease. Electronically Signed   By: Morgane  Naveau M.D.   On: 11/26/2023 21:06     Labs:   Basic Metabolic Panel: Recent Labs  Lab 11/26/23 1953 11/26/23 2344 11/27/23 0325 11/27/23 0855 11/27/23 1232  NA 130* 135 130* 135 134*  K 4.9 4.5 4.1 3.6 3.8  CL 100 104 104 104 103  CO2 11* 12* 14* 19* 19*  GLUCOSE 363* 249* 182* 147* 337*   BUN 19 17 15 13 13   CREATININE 1.20 1.16 0.66 0.91 0.96  CALCIUM 9.6 9.1 8.5* 8.2* 8.2*  MG  --   --   --   --  1.6*  PHOS  --   --   --   --  3.2   GFR Estimated Creatinine  Clearance: 113.8 mL/min (by C-G formula based on SCr of 0.96 mg/dL). Liver Function Tests: Recent Labs  Lab 11/26/23 1953  AST 14*  ALT 13  ALKPHOS 75  BILITOT 1.9*  PROT 8.4*  ALBUMIN 4.6   Recent Labs  Lab 11/26/23 1953  LIPASE 19   No results for input(s): AMMONIA in the last 168 hours. Coagulation profile No results for input(s): INR, PROTIME in the last 168 hours.  CBC: Recent Labs  Lab 11/26/23 1953 11/27/23 0325  WBC 10.7* 10.7*  HGB 16.0 14.2  HCT 48.8 43.0  MCV 87.6 87.9  PLT 298 257   Cardiac Enzymes: No results for input(s): CKTOTAL, CKMB, CKMBINDEX, TROPONINI in the last 168 hours. BNP: Invalid input(s): POCBNP CBG: Recent Labs  Lab 11/27/23 0903 11/27/23 1004 11/27/23 1105 11/27/23 1229 11/27/23 1333  GLUCAP 154* 139* 220* 320* 304*   D-Dimer No results for input(s): DDIMER in the last 72 hours. Hgb A1c No results for input(s): HGBA1C in the last 72 hours. Lipid Profile No results for input(s): CHOL, HDL, LDLCALC, TRIG, CHOLHDL, LDLDIRECT in the last 72 hours. Thyroid function studies No results for input(s): TSH, T4TOTAL, T3FREE, THYROIDAB in the last 72 hours.  Invalid input(s): FREET3 Anemia work up No results for input(s): VITAMINB12, FOLATE, FERRITIN, TIBC, IRON, RETICCTPCT in the last 72 hours. Microbiology Recent Results (from the past 240 hours)  MRSA Next Gen by PCR, Nasal     Status: None   Collection Time: 11/26/23 11:24 PM   Specimen: Nasal Mucosa; Nasal Swab  Result Value Ref Range Status   MRSA by PCR Next Gen NOT DETECTED NOT DETECTED Final    Comment: (NOTE) The GeneXpert MRSA Assay (FDA approved for NASAL specimens only), is one component of a comprehensive MRSA colonization  surveillance program. It is not intended to diagnose MRSA infection nor to guide or monitor treatment for MRSA infections. Test performance is not FDA approved in patients less than 65 years old. Performed at [facility], [address], [place], [place] [address]     Time coordinating discharge: 45 minutes  Signed: Chapman Taylon Louison  [facility] 11/27/2023, 2:18 PM

## 2023-11-27 NOTE — Progress Notes (Signed)
   11/27/23 1422  TOC Brief Assessment  Insurance and Status Reviewed ([place] MEDICAID PREPAID HEALTH PLAN / [place] MEDICAID HEALTHY BLUE)  Patient has primary care physician Yes Mercedes, Kasey, MD)  Home environment has been reviewed Home with family  Prior level of function: Independent  Prior/Current Home Services No current home services  Social Drivers of Health Review SDOH reviewed no interventions necessary  Readmission risk has been reviewed Yes  Transition of care needs no transition of care needs at this time

## 2023-11-29 LAB — BLOOD GAS, VENOUS
Acid-base deficit: 15.4 mmol/L — ABNORMAL HIGH (ref 0.0–2.0)
Bicarbonate: 11.5 mmol/L — ABNORMAL LOW (ref 20.0–28.0)
O2 Saturation: 69.9 %
Patient temperature: 37
pCO2, Ven: 30 mmHg — ABNORMAL LOW (ref 44–60)
pH, Ven: 7.19 — CL (ref 7.25–7.43)
pO2, Ven: 41 mmHg (ref 32–45)

## 2024-01-03 ENCOUNTER — Ambulatory Visit
Admission: EM | Admit: 2024-01-03 | Discharge: 2024-01-03 | Disposition: A | Discharge status: Home / Self Care | Attending: Nurse Practitioner | Admitting: Nurse Practitioner

## 2024-01-03 ENCOUNTER — Encounter: Payer: Self-pay | Admitting: Emergency Medicine

## 2024-01-03 DIAGNOSIS — H0011 Chalazion right upper eyelid: Secondary | ICD-10-CM

## 2024-01-03 HISTORY — DX: Essential (primary) hypertension: I10

## 2024-01-03 MED ORDER — ERYTHROMYCIN 5 MG/GM OP OINT: TOPICAL_OINTMENT | OPHTHALMIC | 0 refills | Status: AC

## 2024-01-03 NOTE — Discharge Instructions (Addendum)
 You were seen today for a bump on your right upper eyelid that has been present for about two months. This is most consistent with a chalazion, which is a firm, painless lump caused by a blocked oil gland in the eyelid. It is not an infection and often improves with simple home care. You have been prescribed erythromycin  ointment to apply as directed. At home, place a warm, moist compress over the eyelid for 5 to 10 minutes, four to six times a day, then gently massage the area to help the gland drain. Be sure to wash your hands before touching your eye and avoid rubbing or trying to squeeze the bump, as this can make it worse.  Most chalazia improve within several weeks of regular home care, but they may take longer to completely go away. If the bump does not decrease in size or resolve after one to two weeks of treatment, you should schedule a visit with an eye doctor for further evaluation. Call sooner if you notice pain, spreading redness, swelling, pus drainage, or changes in your vision. Go to the emergency department right away if you experience sudden vision loss, severe eye pain, or swelling that quickly worsens.

## 2024-01-03 NOTE — ED Triage Notes (Signed)
 Pt reports raised bump to upper R eyelid x2 months. Pt coming in today because it has continued to increase in size over the last 2 months. Pt reports no pain unless he rubs the eye or massages site around the bump. No vision changes. Warm compress provides some relief. No med use for symptoms.

## 2024-01-03 NOTE — ED Provider Notes (Signed)
 [facility]    CSN: [id number] Arrival date & time: 01/03/24  1827      History   Chief Complaint Chief Complaint  Patient presents with   Eye Pain    HPI Ricky Shaw is a 23 y.o. male.   Discussed the use of AI scribe software for clinical note transcription with the patient, who gave verbal consent to proceed.   Patient presents with a growth on his right eye that has been present for 2 months. The patient reports that the growth is located on the inside of his eyelid and has been progressively getting larger over time. The patient denies any pain or discomfort associated with the growth. He also reports no redness of the eyes, drainage, or changes in his vision. He has attempted to manage the condition by performing hot compress with massage, but this has not resulted in improvement. He denies wearing contact lenses. The patient has not sought medical attention for this issue prior to this visit, and he has not tried any other treatments besides massage. The growth appears to be more of a cosmetic concern as he reports it is annoying and he doesn't like the way it looks, rather than causing any functional impairment.  The following portions of the patient's history were reviewed and updated as appropriate: allergies, current medications, past family history, past medical history, past social history, past surgical history, and problem list.      Past Medical History:  Diagnosis Date   Diabetes mellitus 11/14/2011   Diabetes mellitus without complication (HCC)    Phreesia 03/03/2020   Hypertension     Patient Active Problem List   Diagnosis Date Noted   Microalbuminuria 09/12/2023   DKA, type 1 (HCC) 02/01/2023   Hyperkalemia 08/28/2022   Pseudohyponatremia 08/28/2022   DKA (diabetic ketoacidosis) (HCC) 04/20/2020   AKI (acute kidney injury) (HCC) 04/20/2020   Insulin  pump in place 07/08/2017   Hyperglycemia    Insulin  pump titration 11/01/2015    Noncompliance with diabetes treatment    Uncontrolled type 1 diabetes mellitus with hyperglycemia, with long-term current use of insulin  (HCC) 07/29/2014   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 Authorizing Provider  Continuous Glucose Sensor (DEXCOM G6 SENSOR) MISC Change dexcom sensor every 10 days. 09/12/23  Yes Verdon Darnel, NP  erythromycin  ophthalmic ointment Place a 1/2 inch ribbon of ointment into the right lower eyelid every 4 hours while awake for 1 week 01/03/24  Yes Iola Lukes, FNP  insulin  lispro (HUMALOG ) 100 UNIT/ML injection USE 300 UNITS VIA INSULIN  PUMP EVERY 48 HOURS Patient taking differently: Max daily units  via pump is 100 units 09/12/23  Yes Verdon Darnel, NP  lisinopril  (ZESTRIL ) 5 MG tablet Take 1 tablet (5 mg total) by mouth daily. 09/12/23  Yes Verdon Darnel, NP  Continuous Glucose Transmitter (DEXCOM G6 TRANSMITTER) MISC Use as directed Patient not taking: Reported on 01/03/2024 09/12/23   Verdon Darnel, NP  insulin  aspart (NOVOLOG  FLEXPEN) 100 UNIT/ML FlexPen Inject up to 50 units subcutaneously daily if pump fails. Patient not taking: Reported on 09/12/2023 08/01/23   Verdon Darnel, NP  insulin  glargine (LANTUS  SOLOSTAR) 100 UNIT/ML Solostar Pen Inject up to 50  units per day when not using insulin  pump. Patient not taking: Reported on 09/12/2023 08/01/23   Verdon Darnel, NP  Insulin  Pen Needle 32G X 4 MM MISC 1 applicator by Does not apply route 3 (three) times daily. Inject up to 5 x per day. Patient not taking: Reported on 01/03/2024 12/12/21   Verdon Darnel, NP    Family History Family History  Problem Relation Age of Onset   Asthma Maternal Aunt    Cancer Maternal  Grandfather    Diabetes Paternal Grandmother    Hypertension Other    Thyroid disease Neg Hx     Social History Social History   Tobacco Use   Smoking status: Never   Smokeless tobacco: Never  Vaping Use   Vaping status: Never Used  Substance Use Topics   Alcohol use: No   Drug use: Yes    Types: Marijuana     Allergies   Amoxicillin   Review of Systems Review of Systems  Eyes:  Negative for photophobia, pain, discharge, redness, itching and visual disturbance.  All other systems reviewed and are negative.    Physical Exam Triage Vital Signs ED Triage Vitals [01/03/24 1916]  Encounter Vitals Group     BP (!) 143/96     Girls Systolic BP Percentile      Girls Diastolic BP Percentile      Boys Systolic BP Percentile      Boys Diastolic BP Percentile      Pulse Rate 73     Resp 18     Temp 98.1 F (36.7 C)     Temp Source Oral     SpO2 97 %     Weight      Height      Head Circumference      Peak Flow      Pain Score      Pain Loc      Pain Education      Exclude from Growth Chart    No data found.  Updated Vital Signs BP (!) 143/96 (BP Location: Left Arm)   Pulse 73   Temp 98.1 F (36.7 C) (Oral)   Resp 18   SpO2 97%   Visual Acuity Right Eye Distance:   Left Eye Distance:   Bilateral Distance:    Right Eye Near:   Left Eye Near:    Bilateral Near:     Physical Exam Vitals reviewed.  Constitutional:      General: He is awake. He is not in acute distress.    Appearance: Normal appearance. He is well-developed. He is not ill-appearing, toxic-appearing or diaphoretic.  HENT:     Head: Normocephalic.     Right Ear: Hearing normal.     Left Ear: Hearing normal.     Nose: Nose normal.     Mouth/Throat:     Mouth: Mucous membranes are moist.  Eyes:     General: Lids are everted, no foreign bodies appreciated. Vision grossly intact.        Right eye: No foreign body, discharge or hordeolum.     Extraocular Movements: Extraocular  movements intact.     Conjunctiva/sclera: Conjunctivae normal.     Comments: Internal chalazion noted within the right upper eyelid   Cardiovascular:     Rate and Rhythm: Normal rate and regular rhythm.     Heart sounds: Normal heart sounds.  Pulmonary:     Effort: Pulmonary effort is normal.     Breath sounds: Normal breath  sounds and air entry.  Musculoskeletal:        General: Normal range of motion.     Cervical back: Full passive range of motion without pain, normal range of motion and neck supple.  Skin:    General: Skin is warm and dry.  Neurological:     General: No focal deficit present.     Mental Status: He is alert and oriented to person, place, and time.  Psychiatric:        Speech: Speech normal.        Behavior: Behavior is cooperative.      UC Treatments / Results  Labs (all labs ordered are listed, but only abnormal results are displayed) Labs Reviewed - No data to display  EKG   Radiology No results found.  Procedures Procedures (including critical care time)  Medications Ordered in UC Medications - No data to display  Initial Impression / Assessment and Plan / UC Course  I have reviewed the triage vital signs and the nursing notes.  Pertinent labs & imaging results that were available during my care of the patient were reviewed by me and considered in my medical decision making (see chart for details).    The patient presents with a two-month history of a painless lesion on the inner right upper eyelid that has gradually increased in size. There are no associated symptoms of pain, redness, drainage, or vision changes. Examination reveals a firm, non-tender nodule consistent with a chalazion. Treatment was initiated with warm compresses to the eyelid for 5-10 minutes, four to six times daily, followed by gentle massage to help promote drainage. The patient was counseled on proper eyelid hygiene and advised to avoid rubbing or attempting to manipulate  the lesion. Erythromycin  ointment was prescribed to reduce bacterial overgrowth and irritation. The patient was instructed to follow up with an eye doctor if the lesion does not resolve or decrease in size after one to two weeks of conservative care, and sooner if new symptoms develop, including pain, redness, drainage, swelling, or visual changes.  Today's evaluation has revealed no signs of a dangerous process. Discussed diagnosis with patient and/or guardian. Patient and/or guardian aware of their diagnosis, possible red flag symptoms to watch out for and need for close follow up. Patient and/or guardian understands verbal and written discharge instructions. Patient and/or guardian comfortable with plan and disposition.  Patient and/or guardian has a clear mental status at this time, good insight into illness (after discussion and teaching) and has clear judgment to make decisions regarding their care  Documentation was completed with the aid of voice recognition software. Transcription may contain typographical errors.   Final Clinical Impressions(s) / UC Diagnoses   Final diagnoses:  Chalazion of right upper eyelid     Discharge Instructions      You were seen today for a bump on your right upper eyelid that has been present for about two months. This is most consistent with a chalazion, which is a firm, painless lump caused by a blocked oil gland in the eyelid. It is not an infection and often improves with simple home care. You have been prescribed erythromycin  ointment to apply as directed. At home, place a warm, moist compress over the eyelid for 5 to 10 minutes, four to six times a day, then gently massage the area to help the gland drain. Be sure to wash your hands before touching your eye and avoid rubbing or trying to squeeze the bump, as this can make  it worse.  Most chalazia improve within several weeks of regular home care, but they may take longer to completely go away. If the  bump does not decrease in size or resolve after one to two weeks of treatment, you should schedule a visit with an eye doctor for further evaluation. Call sooner if you notice pain, spreading redness, swelling, pus drainage, or changes in your vision. Go to the emergency department right away if you experience sudden vision loss, severe eye pain, or swelling that quickly worsens.      ED Prescriptions     Medication Sig Dispense Auth. Provider   erythromycin  ophthalmic ointment Place a 1/2 inch ribbon of ointment into the right lower eyelid every 4 hours while awake for 1 week 3.5 g Iola Lukes, FNP      PDMP not reviewed this encounter.   Iola Lukes, [place] 01/03/24 [phone]

## 2024-01-11 ENCOUNTER — Emergency Department (HOSPITAL_COMMUNITY)
Admission: EM | Admit: 2024-01-11 | Discharge: 2024-01-12 | Disposition: A | Discharge status: Home / Self Care | Attending: Emergency Medicine | Admitting: Emergency Medicine

## 2024-01-11 ENCOUNTER — Encounter (HOSPITAL_COMMUNITY): Payer: Self-pay | Admitting: Emergency Medicine

## 2024-01-11 ENCOUNTER — Other Ambulatory Visit: Payer: Self-pay

## 2024-01-11 DIAGNOSIS — R739 Hyperglycemia, unspecified: Secondary | ICD-10-CM

## 2024-01-11 DIAGNOSIS — Z794 Long term (current) use of insulin: Secondary | ICD-10-CM | POA: Insufficient documentation

## 2024-01-11 DIAGNOSIS — E1065 Type 1 diabetes mellitus with hyperglycemia: Secondary | ICD-10-CM | POA: Insufficient documentation

## 2024-01-11 LAB — CBC
HCT: 41.9 % (ref 39.0–52.0)
Hemoglobin: 14.1 g/dL (ref 13.0–17.0)
MCH: 28.7 pg (ref 26.0–34.0)
MCHC: 33.7 g/dL (ref 30.0–36.0)
MCV: 85.3 fL (ref 80.0–100.0)
Platelets: 296 K/uL (ref 150–400)
RBC: 4.91 MIL/uL (ref 4.22–5.81)
RDW: 13.3 % (ref 11.5–15.5)
WBC: 6.9 K/uL (ref 4.0–10.5)
nRBC: 0 % (ref 0.0–0.2)

## 2024-01-11 LAB — I-STAT CHEM 8, ED
BUN: 19 mg/dL (ref 6–20)
Calcium, Ion: 1.21 mmol/L (ref 1.15–1.40)
Chloride: 103 mmol/L (ref 98–111)
Creatinine, Ser: 0.9 mg/dL (ref 0.61–1.24)
Glucose, Bld: 147 mg/dL — ABNORMAL HIGH (ref 70–99)
HCT: 44 % (ref 39.0–52.0)
Hemoglobin: 15 g/dL (ref 13.0–17.0)
Potassium: 3.5 mmol/L (ref 3.5–5.1)
Sodium: 136 mmol/L (ref 135–145)
TCO2: 26 mmol/L (ref 22–32)

## 2024-01-11 LAB — BLOOD GAS, VENOUS
Acid-base deficit: 0.4 mmol/L (ref 0.0–2.0)
Bicarbonate: 24.8 mmol/L (ref 20.0–28.0)
O2 Saturation: 96.2 %
Patient temperature: 36.1
pCO2, Ven: 40 mmHg — ABNORMAL LOW (ref 44–60)
pH, Ven: 7.39 (ref 7.25–7.43)
pO2, Ven: 82 mmHg — ABNORMAL HIGH (ref 32–45)

## 2024-01-11 LAB — CBG MONITORING, ED: Glucose-Capillary: 272 mg/dL — ABNORMAL HIGH (ref 70–99)

## 2024-01-11 LAB — LACTIC ACID, PLASMA: Lactic Acid, Venous: 3.7 mmol/L (ref 0.5–1.9)

## 2024-01-11 LAB — I-STAT CG4 LACTIC ACID, ED: Lactic Acid, Venous: 1.4 mmol/L (ref 0.5–1.9)

## 2024-01-11 NOTE — ED Triage Notes (Signed)
 23 y/o male comes in c/o hyperglycemia after his insulin  pump fell off earlier this afternoon. Pt reports his blood sugar has been running high throughout the day and reports a significant hx of DKA in the past. Type 1. CNG 315 on scene with a heart rate of 175 per EMS. Pt was given 400mL of NS pta

## 2024-01-12 LAB — CBG MONITORING, ED: Glucose-Capillary: 111 mg/dL — ABNORMAL HIGH (ref 70–99)

## 2024-01-12 NOTE — Discharge Instructions (Signed)
 Your lab work this evening was reassuring.  If you develop any life-threatening symptoms return to the emergency department.  Please resume using your home insulin  pump as soon as you are possible.

## 2024-01-12 NOTE — ED Provider Notes (Signed)
 [place] [facility] AT [facility] Provider Note   CSN: [id number] Arrival date & time: 01/11/24  2124     Patient presents with: Hyperglycemia   Ricky Shaw is a 23 y.o. male.  Patient with past medical history significant for type I DM on insulin  pump presents to the emergency department complaining of possible hypoglycemia.  Patient states that he was at work when he accidentally dislodged his insulin  pump.  He was checked by EMS and noted to be hyperglycemic.  He was administered 400 mL of fluid by EMS.  EMS also reported that the patient was tachycardic but the patient denies feeling a fast heart rate and he has had a normal heart rate here in the emergency department.  The patient currently has no complaints.  He states he has had a history of DKA and wants to make sure his labs are okay.  He states that he is able to restart his insulin  pump at home if he is found to be safe to go home.    Hyperglycemia      Prior to Admission medications   Medication Sig Start Date End Date Taking? Authorizing Provider  Continuous Glucose Sensor (DEXCOM G6 SENSOR) MISC Change dexcom sensor every 10 days. 09/12/23   Verdon Darnel, NP  Continuous Glucose Transmitter (DEXCOM G6 TRANSMITTER) MISC Use as directed Patient not taking: Reported on 01/03/2024 09/12/23   Verdon Darnel, NP  erythromycin  ophthalmic ointment Place a 1/2 inch ribbon of ointment into the right lower eyelid every 4 hours while awake for 1 week 01/03/24   Iola Lukes, FNP  insulin  aspart (NOVOLOG  FLEXPEN) 100 UNIT/ML FlexPen Inject up to 50 units subcutaneously daily if pump fails. Patient not taking: Reported on 09/12/2023 08/01/23   Verdon Darnel, NP  insulin  glargine (LANTUS  SOLOSTAR) 100 UNIT/ML Solostar Pen Inject up to 50 units per day when not using insulin  pump. Patient not taking: Reported on 09/12/2023 08/01/23   Verdon Darnel, NP  insulin  lispro (HUMALOG ) 100 UNIT/ML injection USE  300 UNITS VIA INSULIN  PUMP EVERY 48 HOURS Patient taking differently: Max daily units  via pump is 100 units 09/12/23   Verdon Darnel, NP  Insulin  Pen Needle 32G X 4 MM MISC 1 applicator by Does not apply route 3 (three) times daily. Inject up to 5 x per day. Patient not taking: Reported on 01/03/2024 12/12/21   Verdon Darnel, NP  lisinopril  (ZESTRIL ) 5 MG tablet Take 1 tablet (5 mg total) by mouth daily. 09/12/23   Verdon Darnel, NP    Allergies: Amoxicillin    Review of Systems  Updated Vital Signs BP 125/71   Pulse 95   Temp 98.2 F (36.8 C) (Oral)   Resp 14   Ht 5' 7 (1.702 m)   Wt 81.6 kg   SpO2 96%   BMI 28.19 kg/m   Physical Exam Vitals and nursing note reviewed.  Constitutional:      General: He is not in acute distress.    Appearance: He is well-developed.  HENT:     Head: Normocephalic and atraumatic.  Eyes:     Conjunctiva/sclera: Conjunctivae normal.  Cardiovascular:     Rate and Rhythm: Normal rate and regular rhythm.     Heart sounds: No murmur heard. Pulmonary:     Effort: Pulmonary effort is normal. No respiratory distress.     Breath sounds: Normal breath sounds.  Abdominal:     Palpations: Abdomen is soft.     Tenderness: There is no  abdominal tenderness.  Musculoskeletal:        General: No swelling.     Cervical back: Neck supple.  Skin:    General: Skin is warm and dry.     Capillary Refill: Capillary refill takes less than 2 seconds.  Neurological:     Mental Status: He is alert.  Psychiatric:        Mood and Affect: Mood normal.     (all labs ordered are listed, but only abnormal results are displayed) Labs Reviewed  LACTIC ACID, PLASMA - Abnormal; Notable for the following components:      Result Value   Lactic Acid, Venous 3.7 (*)    All other components within normal limits  BLOOD GAS, VENOUS - Abnormal; Notable for the following components:   pCO2, Ven 40 (*)    pO2, Ven 82 (*)    All other components within normal limits   CBG MONITORING, ED - Abnormal; Notable for the following components:   Glucose-Capillary 272 (*)    All other components within normal limits  I-STAT CHEM 8, ED - Abnormal; Notable for the following components:   Glucose, Bld 147 (*)    All other components within normal limits  CBC  URINALYSIS, ROUTINE W REFLEX MICROSCOPIC  LACTIC ACID, PLASMA  COMPREHENSIVE METABOLIC PANEL WITH GFR  I-STAT CG4 LACTIC ACID, ED    EKG: None  Radiology: No results found.   Procedures   Medications Ordered in the ED - No data to display                                  Medical Decision Making Amount and/or Complexity of Data Reviewed Labs: ordered.   This patient presents to the ED for concern of hyperglycemia, this involves an extensive number of treatment options, and is a complaint that carries with it a high risk of complications and morbidity.  The differential diagnosis includes hyperglycemia, DKA, HHS, others   Co morbidities / Chronic conditions that complicate the patient evaluation  Type I DM   Additional history obtained:  Additional history obtained from EMR External records from outside source obtained and reviewed including admission notes from July where patient was admitted for DKA   Lab Tests:  I Ordered, and personally interpreted labs.  The pertinent results include: Normal pH, unremarkable CBC, glucose 147 on Chem-8, improved after fluids.  Patient initially had a lactic acid of 3.7 which was drawn from his EMS IV.  This appears to be a false value.  We drew a repeat lactic acid which was 1.4.  The patient continues to be asymptomatic   Social Determinants of Health:  Patient has Medicaid for his primary health insurance type   Test / Admission - Considered:  Patient with mild hyperglycemia but no evidence of DKA or HHS.  He is asymptomatic.  Feel that the first lactic acid was a false value.  He is resting comfortably.  He states he is able to resume his  insulin  at home once discharged.  Patient stable for discharge home at this time with return precautions.      Final diagnoses:  Hyperglycemia    ED Discharge Orders     None          Logan Ubaldo KATHEE DEVONNA 01/12/24 [id number]    Bari Charmaine FALCON, MD 01/12/24 [phone]

## 2024-01-31 ENCOUNTER — Encounter: Payer: Self-pay | Admitting: *Deleted

## 2024-02-17 ENCOUNTER — Ambulatory Visit: Payer: Self-pay | Admitting: Endocrinology

## 2024-02-17 ENCOUNTER — Ambulatory Visit: Admitting: Endocrinology

## 2024-02-17 ENCOUNTER — Encounter: Payer: Self-pay | Admitting: Endocrinology

## 2024-02-17 VITALS — BP 118/82 | HR 105 | Resp 20 | Ht 67.0 in | Wt 164.4 lb

## 2024-02-17 DIAGNOSIS — E1065 Type 1 diabetes mellitus with hyperglycemia: Secondary | ICD-10-CM

## 2024-02-17 LAB — POCT GLYCOSYLATED HEMOGLOBIN (HGB A1C): Hemoglobin A1C: 10.9 % — AB (ref 4.0–5.6)

## 2024-02-17 MED ORDER — DEXCOM G7 SENSOR MISC: 1.0000 | 3 refills | Status: AC

## 2024-02-17 NOTE — Patient Instructions (Addendum)
 Call [employer] company to update application into pump to work with DEXCOM G7.  OTHELIA G7 sent to pharmacy, if needed will complete prior authorization.  Call pump supplier company and provide my name and clinic address

## 2024-02-17 NOTE — Progress Notes (Unsigned)
 Outpatient Endocrinology Note [place] Chrissa Meetze, MD   Patient's Name: Ricky Shaw    DOB: 08-28-2000    MRN: [id number]                                                    REASON OF VISIT: New consult for type 1 diabetes mellitus  REFERRING PROVIDER: Verdon Darnel, NP  PCP: [name], MD  HISTORY OF PRESENT ILLNESS:   Ricky Shaw is a 23 y.o. old male with past medical history listed below, is here for new consult for type 1 diabetes mellitus.   Pertinent Diabetes History: Initial consult on February 17, 2024.  Patient is transitioning diabetes care from pediatric to adult endocrinology.  Patient was diagnosed with type 1 diabetes mellitus in July 2013.  Hemoglobin A1c at the time of diagnosis was 13.3%.  He had insulin  antibodies and IA 2 antibody elevated consistent with having type 1 diabetes mellitus.  He has uncontrolled type 1 diabetes mellitus with hemoglobin A1c mostly in the range of 11%.  He is currently on tandem t:slim insulin  pump therapy.  History of DKA or diabetes related hospitalizations: yes  Previous diabetes education: Yes   Chronic Diabetes Complications : Retinopathy: no. Last ophthalmology exam was done on Due, following with ophthalmology regularly.  Nephropathy: Microalbuminuria, on ACE/ARB /lisinopril . Peripheral neuropathy: no Coronary artery disease: no Stroke: no  Relevant comorbidities and cardiovascular risk factors: Obesity: no Body mass index is 25.75 kg/m.  Hypertension: no Hyperlipidemia : Yes, not on statin.   Current / Home Diabetic regimen includes:  Tandem t:slim insulin  pump with Dexcom G6, currently not using using Humalog  U100.  Insulin  Pump setting:  Basal MN- 2.1u/hour 3AM- 2.150  6AM- 2.20 8AM- 2.50 9PM-   2.40  Bolus CHO Ratio (1unit:CHO) MN- 1:8 3AM- 1:8 6AM- 1:6 8AM- 1:5 9PM-    1:5  Correction/Sensitivity: MN- 1:24 3AM- 1:24 6AM- 1:25 8AM-    1:20 9PM-    1:25  Target: 110   Active  insulin  time: 5 hours  Prior diabetic medications: Multidose insulin  regimen.  CONTINUOUS GLUCOSE MONITORING SYSTEM (CGMS) / INSULIN  PUMP INTERPRETATION:                         Tandem Pump & Sensor Download (Reviewed and summarized below.) Pump: Dexcom G6 and Tandem t:slim Dates: September 23 to February 17, 2024  Currently not using CGM/Dexcom G6.  Average total daily insulin :  75 units, Basal: 71%, Bolus: 29%,  (Manual Bolus: 100%,  Control IQ Bolus: 0%)   Basal/Control IQ Time in Use: 0%  Checking blood sugar with glucometer mostly hyperglycemia blood sugar in the range of 328-425 range.  He has been bolusing for meals 0-3 times a day.  He has been using sleep mode during the night.   Hypoglycemia: Patient has no hypoglycemic episodes. Patient has hypoglycemia awareness.    Factors modifying glucose control: 1.  Diabetic diet assessment: 3 meals a day.  2.  Staying active or exercising:   3.  Medication compliance: compliant most of the time.  Interval history  Patient presented to establish endocrinology care for known type 1 diabetes mellitus.  He has uncontrolled type 1 diabetes mellitus.  Currently on insulin  pump therapy.  REVIEW OF SYSTEMS As per history of present illness.   PAST  MEDICAL HISTORY: Past Medical History:  Diagnosis Date   Diabetes mellitus 11/14/2011   Diabetes mellitus without complication (HCC)    Phreesia 03/03/2020   Hypertension     PAST SURGICAL HISTORY: Past Surgical History:  Procedure Laterality Date   nasal cauterization     NASAL HEMORRHAGE CONTROL  05/14/2006    ALLERGIES: Allergies  Allergen Reactions   Amoxicillin Hives    FAMILY HISTORY:  Family History  Problem Relation Age of Onset   Asthma Maternal Aunt    Cancer Maternal Grandfather    Diabetes Paternal Grandmother    Hypertension Other    Thyroid disease Neg Hx     SOCIAL HISTORY: Social History   Socioeconomic History   Marital status: Single     Spouse name: Not on file   Number of children: Not on file   Years of education: Not on file   Highest education level: Not on file  Occupational History   Not on file  Tobacco Use   Smoking status: Never   Smokeless tobacco: Never  Vaping Use   Vaping status: Never Used  Substance and Sexual Activity   Alcohol use: No   Drug use: Yes    Types: Marijuana   Sexual activity: Never    Birth control/protection: Abstinence  Other Topics Concern   Not on file  Social History Narrative   Lives with mom and brother.    Graduated not in school currently just working    Social Drivers of Corporate investment banker Strain: Not on file  Food Insecurity: No Food Insecurity (11/27/2023)   Hunger Vital Sign    Worried About Running Out of Food in the Last Year: Never true    Ran Out of Food in the Last Year: Never true  Transportation Needs: No Transportation Needs (11/27/2023)   PRAPARE - Administrator, Civil Service (Medical): No    Lack of Transportation (Non-Medical): No  Physical Activity: Not on file  Stress: Not on file  Social Connections: Not on file    MEDICATIONS:  Current Outpatient Medications  Medication Sig Dispense Refill   Continuous Glucose Sensor (DEXCOM G7 SENSOR) MISC 1 Device by Does not apply route continuous. 9 each 3   erythromycin  ophthalmic ointment Place a 1/2 inch ribbon of ointment into the right lower eyelid every 4 hours while awake for 1 week 3.5 g 0   insulin  aspart (NOVOLOG  FLEXPEN) 100 UNIT/ML FlexPen Inject up to 50 units subcutaneously daily if pump fails. 15 mL 5   insulin  glargine (LANTUS  SOLOSTAR) 100 UNIT/ML Solostar Pen Inject up to 50 units per day when not using insulin  pump. 15 mL 5   insulin  lispro (HUMALOG ) 100 UNIT/ML injection USE 300 UNITS VIA INSULIN  PUMP EVERY 48 HOURS (Patient taking differently: Max daily units  via pump is 100 units) 40 mL 5   Insulin  Pen Needle 32G X 4 MM MISC 1 applicator by Does not apply route 3  (three) times daily. Inject up to 5 x per day. 250 each 3   lisinopril  (ZESTRIL ) 5 MG tablet Take 1 tablet (5 mg total) by mouth daily. 30 tablet 3   Continuous Glucose Transmitter (DEXCOM G6 TRANSMITTER) MISC Use as directed (Patient not taking: Reported on 02/17/2024) 1 each 6   No current facility-administered medications for this visit.    PHYSICAL EXAM: Vitals:   02/17/24 1334  BP: 118/82  Pulse: (!) 105  Resp: 20  SpO2: 97%  Weight: 164 lb 6.4  oz (74.6 kg)  Height: 5' 7 (1.702 m)   Body mass index is 25.75 kg/m.  Wt Readings from Last 3 Encounters:  02/17/24 164 lb 6.4 oz (74.6 kg)  01/11/24 180 lb (81.6 kg)  11/26/23 155 lb 6.8 oz (70.5 kg)    General: Well developed, well nourished male in no apparent distress.  HEENT: AT/[place], no external lesions.  Eyes: Conjunctiva clear and no icterus. Neck: Neck supple  Lungs: Respirations not labored Neurologic: Alert, oriented, normal speech Extremities / Skin: Dry.  Psychiatric: Does not appear depressed or anxious  Diabetic Foot Exam - Simple   No data filed    LABS Reviewed Lab Results  Component Value Date   HGBA1C 10.9 (A) 02/17/2024   HGBA1C 11.4 (H) 11/27/2023   HGBA1C 11.9 (A) 08/01/2023   No results found for: FRUCTOSAMINE Lab Results  Component Value Date   CHOL 177 11/06/2022   HDL 29 (L) 11/06/2022   LDLCALC 126 (H) 11/06/2022   TRIG 110 11/06/2022   CHOLHDL 6.1 (H) 11/06/2022   Lab Results  Component Value Date   MICRALBCREAT 141 (H) 11/06/2022   MICRALBCREAT 11 12/17/2018   Lab Results  Component Value Date   CREATININE 0.90 01/11/2024   No results found for: GFR  ASSESSMENT / PLAN  1. Type 1 diabetes mellitus with hyperglycemia (HCC)     Diabetes Mellitus type 1, complicated by microalbuminuria. - Diabetic status / severity: Uncontrolled.  Lab Results  Component Value Date   HGBA1C 10.9 (A) 02/17/2024    - Hemoglobin A1c goal <6.5%   Discussed about type 1 diabetes mellitus  and potential chronic diabetic complications including diabetic retinopathy, neuropathy and nephropathy.  Discussed about importance of controlling blood sugar.  - Medications:  Insulin  pump setting changed as follows: Insulin  Pump setting: No pump setting. Continue tandem t:slim insulin  pump.  Sent prescription for Dexcom G7.  Patient is advised to contact tandem company to update application in his pump to be compatible with Dexcom G7.  Advised patient to bolus for meals before eating for all the meals.  Patient is encouraged to use insulin  pump with CGM.  Patient is advised to contact his diabetes supplies supplier to send refill request to our clinic.   - Home glucose testing: continue CGM and check blood glucose as needed.  Sent prescription for Dexcom G7. - Discussed/ Gave Hypoglycemia treatment plan.  # Consult : not required at this time.   # Annual urine for microalbuminuria/ creatinine ratio, + microalbuminuria currently, continue ACE/ARB /lisinopril . Last  Lab Results  Component Value Date   MICRALBCREAT 141 (H) 11/06/2022    # Foot check nightly.  # Annual dilated diabetic eye exams.  Advised to have diabetic eye exam.  - Diet: Make healthy diabetic food choices - Life style / activity / exercise: Discussed.  2. Blood pressure  -  BP Readings from Last 1 Encounters:  02/17/24 118/82    - Control is in target.  - No change in current plans.  3. Lipid status / Hyperlipidemia - Last  Lab Results  Component Value Date   LDLCALC 126 (H) 11/06/2022   - Medication and restarting at this time.  Will consider in the future visit.  Diagnoses and all orders for this visit:  Type 1 diabetes mellitus with hyperglycemia (HCC) -     POCT glycosylated hemoglobin (Hb A1C) -     Continuous Glucose Sensor (DEXCOM G7 SENSOR) MISC; 1 Device by Does not apply route continuous.  DISPOSITION Follow up in clinic in 6 weeks suggested.   All questions answered and  patient verbalized understanding of the plan.  [place] Karder Goodin, MD [facility] [address] [place], [address] [place], [place] [address] Phone # [phone]  At least part of this note was generated using voice recognition software. Inadvertent word errors may have occurred, which were not recognized during the proofreading process.

## 2024-03-30 ENCOUNTER — Other Ambulatory Visit

## 2024-03-30 ENCOUNTER — Telehealth: Payer: Self-pay

## 2024-03-30 ENCOUNTER — Other Ambulatory Visit (HOSPITAL_COMMUNITY): Payer: Self-pay

## 2024-03-30 ENCOUNTER — Ambulatory Visit (INDEPENDENT_AMBULATORY_CARE_PROVIDER_SITE_OTHER): Admitting: Endocrinology

## 2024-03-30 ENCOUNTER — Encounter: Payer: Self-pay | Admitting: Endocrinology

## 2024-03-30 VITALS — BP 122/80 | HR 100 | Resp 20 | Ht 67.0 in | Wt 158.4 lb

## 2024-03-30 DIAGNOSIS — E1065 Type 1 diabetes mellitus with hyperglycemia: Secondary | ICD-10-CM

## 2024-03-30 MED ORDER — DEXCOM G7 SENSOR MISC: Status: AC

## 2024-03-30 NOTE — Telephone Encounter (Signed)
 Pharmacy Patient Advocate Encounter   Received notification from Pt Calls Messages that prior authorization for Dexcom G7 sensor is required/requested.   Insurance verification completed.   The patient is insured through [employer].   Per test claim: PA required; PA submitted to above mentioned insurance via Latent Key/confirmation #/EOC [id number] Status is pending

## 2024-03-30 NOTE — Progress Notes (Signed)
 Outpatient Endocrinology Note Ricky Rosen, MD   Patient's Name: Ricky Shaw    DOB: 2000/09/07    MRN: [id number]                                                    REASON OF VISIT: Follow-up for type 1 diabetes mellitus  REFERRING PROVIDER: Verdon Darnel, NP  PCP: [name], MD  HISTORY OF PRESENT ILLNESS:   Ricky Shaw is a 23 y.o. old male with past medical history listed below, is here for follow-up for type 1 diabetes mellitus.   Pertinent Diabetes History: Initial consult on February 17, 2024.  Patient transitioned diabetes care from pediatric to adult endocrinology.  Patient was diagnosed with type 1 diabetes mellitus in July 2013.  Hemoglobin A1c at the time of diagnosis was 13.3%.  He had insulin  antibodies and IA 2 antibody elevated consistent with having type 1 diabetes mellitus.  He has uncontrolled type 1 diabetes mellitus with hemoglobin A1c mostly in the range of 11%.  He is currently on tandem t:slim insulin  pump therapy.  History of DKA or diabetes related hospitalizations: yes  Previous diabetes education: Yes   Chronic Diabetes Complications : Retinopathy: no. Last ophthalmology exam was done on Due, following with ophthalmology regularly.  Nephropathy: Microalbuminuria, on ACE/ARB /lisinopril . Peripheral neuropathy: no Coronary artery disease: no Stroke: no  Relevant comorbidities and cardiovascular risk factors: Obesity: no Body mass index is 24.81 kg/m.  Hypertension: no Hyperlipidemia : Yes, not on statin.   Current / Home Diabetic regimen includes:  Tandem t:slim insulin  pump with Dexcom G6, currently not using using Humalog  U100.  Insulin  Pump setting:  Basal ( 56.850 units) MN- 2.1u/hour 3AM- 2.150  6AM- 2.20 8AM- 2.50 9PM-   2.40  Bolus CHO Ratio (1unit:CHO) MN- 1:8 3AM- 1:8 6AM- 1:6 8AM- 1:5 9PM-    1:5  Correction/Sensitivity: MN- 1:24 3AM- 1:24 6AM- 1:25 8AM-    1:20 9PM-    1:25  Target: 110    Active insulin  time: 5 hours  Prior diabetic medications: Multidose insulin  regimen.  CONTINUOUS GLUCOSE MONITORING SYSTEM (CGMS) / INSULIN  PUMP INTERPRETATION:                         Tandem Pump & Sensor Download (Reviewed and summarized below.) Pump: Dexcom G6 and Tandem t:slim Dates: October 21 to November 17 , 2025, 14 days  Currently not using CGM/Dexcom G6.  Average total daily insulin :  70units, Basal: 77%, Bolus: 23%,  (Manual Bolus: 100%,  Control IQ Bolus: 0%)   Basal/Control IQ Time in Use: 0%  Checking blood sugar with glucometer mostly hyperglycemia blood sugar in the range of 230-325 range.  He has been bolusing for meals 0-2 times a day.  He has been using sleep mode during the night.   Hypoglycemia: Patient has no hypoglycemic episodes. Patient has hypoglycemia awareness.    Factors modifying glucose control: 1.  Diabetic diet assessment: 3 meals a day.  2.  Staying active or exercising:   3.  Medication compliance: compliant most of the time.  Interval history  Patient not able to get Dexcom G7 as planned and ordered in last visit.  He has been using insulin  pump with no CGM.  Mostly hyperglycemia.  He stated his pump is out of warranty and  waiting to get the replacement.  We sent to prior authorization team for prior authorization of Dexcom G7.  Patient is asked to contact tandem representative, he has the phone number, for updating app to be able to use Dexcom G7 on the current pump and also ask for warranty and replacement pump.  Provided Dexcom G7 two sensor samples in the clinic today.  He is due to have diabetic eye exam.  He denies numbness and tinging of the feet.  No vision problem.  No other complaints today.  REVIEW OF SYSTEMS As per history of present illness.   PAST MEDICAL HISTORY: Past Medical History:  Diagnosis Date   Diabetes mellitus 11/14/2011   Diabetes mellitus without complication (HCC)    Phreesia 03/03/2020   Hypertension      PAST SURGICAL HISTORY: Past Surgical History:  Procedure Laterality Date   nasal cauterization     NASAL HEMORRHAGE CONTROL  05/14/2006    ALLERGIES: Allergies  Allergen Reactions   Amoxicillin Hives    FAMILY HISTORY:  Family History  Problem Relation Age of Onset   Asthma Maternal Aunt    Cancer Maternal Grandfather    Diabetes Paternal Grandmother    Hypertension Other    Thyroid disease Neg Hx     SOCIAL HISTORY: Social History   Socioeconomic History   Marital status: Single    Spouse name: Not on file   Number of children: Not on file   Years of education: Not on file   Highest education level: Not on file  Occupational History   Not on file  Tobacco Use   Smoking status: Never   Smokeless tobacco: Never  Vaping Use   Vaping status: Never Used  Substance and Sexual Activity   Alcohol use: No   Drug use: Yes    Types: Marijuana   Sexual activity: Never    Birth control/protection: Abstinence  Other Topics Concern   Not on file  Social History Narrative   Lives with mom and brother.    Graduated not in school currently just working    Social Drivers of Corporate Investment Banker Strain: Not on file  Food Insecurity: No Food Insecurity (11/27/2023)   Hunger Vital Sign    Worried About Running Out of Food in the Last Year: Never true    Ran Out of Food in the Last Year: Never true  Transportation Needs: No Transportation Needs (11/27/2023)   PRAPARE - Administrator, Civil Service (Medical): No    Lack of Transportation (Non-Medical): No  Physical Activity: Not on file  Stress: Not on file  Social Connections: Not on file    MEDICATIONS:  Current Outpatient Medications  Medication Sig Dispense Refill   Continuous Glucose Sensor (DEXCOM G7 SENSOR) MISC Use to check glucose continuously, change sensor every 10 days     erythromycin  ophthalmic ointment Place a 1/2 inch ribbon of ointment into the right lower eyelid every 4 hours  while awake for 1 week 3.5 g 0   insulin  aspart (NOVOLOG  FLEXPEN) 100 UNIT/ML FlexPen Inject up to 50 units subcutaneously daily if pump fails. 15 mL 5   insulin  glargine (LANTUS  SOLOSTAR) 100 UNIT/ML Solostar Pen Inject up to 50 units per day when not using insulin  pump. 15 mL 5   insulin  lispro (HUMALOG ) 100 UNIT/ML injection USE 300 UNITS VIA INSULIN  PUMP EVERY 48 HOURS (Patient taking differently: Max daily units  via pump is 100 units) 40 mL 5   Insulin   Pen Needle 32G X 4 MM MISC 1 applicator by Does not apply route 3 (three) times daily. Inject up to 5 x per day. 250 each 3   lisinopril  (ZESTRIL ) 5 MG tablet Take 1 tablet (5 mg total) by mouth daily. 30 tablet 3   Continuous Glucose Sensor (DEXCOM G7 SENSOR) MISC 1 Device by Does not apply route continuous. (Patient not taking: Reported on 03/30/2024) 9 each 3   Continuous Glucose Transmitter (DEXCOM G6 TRANSMITTER) MISC Use as directed (Patient not taking: Reported on 03/30/2024) 1 each 6   No current facility-administered medications for this visit.    PHYSICAL EXAM: Vitals:   03/30/24 1039  BP: 122/80  Pulse: 100  Resp: 20  SpO2: 97%  Weight: 158 lb 6.4 oz (71.8 kg)  Height: 5' 7 (1.702 m)   Body mass index is 24.81 kg/m.  Wt Readings from Last 3 Encounters:  03/30/24 158 lb 6.4 oz (71.8 kg)  02/17/24 164 lb 6.4 oz (74.6 kg)  01/11/24 180 lb (81.6 kg)    General: Well developed, well nourished male in no apparent distress.  HEENT: AT/[place], no external lesions.  Eyes: Conjunctiva clear and no icterus. Neck: Neck supple  Lungs: Respirations not labored Neurologic: Alert, oriented, normal speech Extremities / Skin: Dry.  Psychiatric: Does not appear depressed or anxious  Diabetic Foot Exam - Simple   Simple Foot Form Diabetic Foot exam was performed with the following findings: Yes 03/30/2024 11:31 AM  Visual Inspection See comments: Yes Sensation Testing Intact to touch and monofilament testing bilaterally:  Yes Pulse Check Posterior Tibialis and Dorsalis pulse intact bilaterally: Yes Comments Right great toe with callus otherwise no deformities.  No ulcer.    LABS Reviewed Lab Results  Component Value Date   HGBA1C 10.9 (A) 02/17/2024   HGBA1C 11.4 (H) 11/27/2023   HGBA1C 11.9 (A) 08/01/2023   No results found for: FRUCTOSAMINE Lab Results  Component Value Date   CHOL 177 11/06/2022   HDL 29 (L) 11/06/2022   LDLCALC 126 (H) 11/06/2022   TRIG 110 11/06/2022   CHOLHDL 6.1 (H) 11/06/2022   Lab Results  Component Value Date   MICRALBCREAT 141 (H) 11/06/2022   MICRALBCREAT 11 12/17/2018   Lab Results  Component Value Date   CREATININE 0.90 01/11/2024   No results found for: GFR  ASSESSMENT / PLAN  1. Uncontrolled type 1 diabetes mellitus with hyperglycemia (HCC)   2. Type 1 diabetes mellitus with hyperglycemia (HCC)    Diabetes Mellitus type 1, complicated by microalbuminuria. - Diabetic status / severity: Uncontrolled.  Lab Results  Component Value Date   HGBA1C 10.9 (A) 02/17/2024    - Hemoglobin A1c goal <6.5%   Discussed about type 1 diabetes mellitus and potential chronic diabetic complications including diabetic retinopathy, neuropathy and nephropathy.  Discussed about importance of controlling blood sugar.  He remains uncontrolled, part of it is related to not having CGM.  He is relatively on high settings on the pump.  - Medications:  Insulin  Pump setting: No change on pump setting today.  Advised patient to use meal boluses for meals and snacks all the time.  Continue tandem t:slim insulin  pump.  Sent prescription for Dexcom G7.  Sent to prior authorization.  He was not able to refill.  Patient is advised to contact tandem company to update application in his pump to be compatible with Dexcom G7.  Patient is also going to contact tandem to check the warranty status and if he is going to  get new pump.  Patient is advised to contact his diabetes  supplies supplier to send refill request to our clinic.   - Home glucose testing: continue CGM when available and check blood glucose as needed. For now check blood sugar before meals and at bedtime.- Discussed/ Gave Hypoglycemia treatment plan.  # Consult : not required at this time.   # Annual urine for microalbuminuria/ creatinine ratio, + microalbuminuria currently, continue ACE/ARB /lisinopril .  Will check today. Last  Lab Results  Component Value Date   MICRALBCREAT 141 (H) 11/06/2022    # Foot check nightly.  # Annual dilated diabetic eye exams.  Advised to have diabetic eye exam.  - Diet: Make healthy diabetic food choices - Life style / activity / exercise: Discussed.  2. Blood pressure  -  BP Readings from Last 1 Encounters:  03/30/24 122/80    - Control is in target.  - No change in current plans.  3. Lipid status / Hyperlipidemia - Last  Lab Results  Component Value Date   LDLCALC 126 (H) 11/06/2022   - Not on statin at this time.  Will consider in the future visit.  Diagnoses and all orders for this visit:  Uncontrolled type 1 diabetes mellitus with hyperglycemia (HCC)  Type 1 diabetes mellitus with hyperglycemia (HCC) -     Cancel: POCT glycosylated hemoglobin (Hb A1C) -     Continuous Glucose Sensor (DEXCOM G7 SENSOR) MISC; Use to check glucose continuously, change sensor every 10 days -     Microalbumin / creatinine urine ratio    DISPOSITION Follow up in clinic in 3 months suggested.  Labs as ordered today.  Encouraged to call our clinic with any question in between the visits.   All questions answered and patient verbalized understanding of the plan.  Wilmetta Speiser, MD [facility] [address] [place], [address] [place], [place] [address] Phone # [phone]  At least part of this note was generated using voice recognition software. Inadvertent word errors may have occurred, which were not recognized during the  proofreading process.

## 2024-03-30 NOTE — Telephone Encounter (Signed)
 During patient's office visit states he was told he needs a PA for his dexcom G7 patient has been going without.

## 2024-03-31 ENCOUNTER — Ambulatory Visit: Payer: Self-pay | Admitting: Endocrinology

## 2024-03-31 LAB — MICROALBUMIN / CREATININE URINE RATIO
Creatinine, Urine: 130 mg/dL (ref 20–320)
Microalb Creat Ratio: 182 mg/g{creat} — ABNORMAL HIGH (ref <30)
Microalb, Ur: 23.7 mg/dL

## 2024-04-01 NOTE — Telephone Encounter (Signed)
 Pharmacy Patient Advocate Encounter  Received notification from [employer] that Prior Authorization for Dexcom G7 sensor has been APPROVED from 03/30/2024 to 09/26/2024   PA #/Case ID/Reference #: [id number]

## 2024-06-30 ENCOUNTER — Ambulatory Visit: Admitting: Endocrinology
